# Patient Record
Sex: Male | Born: 1970 | Hispanic: No | Smoking: Former smoker
Health system: Southern US, Community
[De-identification: ages and names within clinical notes are randomized; demographics above are authoritative.]

## PROBLEM LIST (undated history)

## (undated) DIAGNOSIS — E7800 Pure hypercholesterolemia, unspecified: Secondary | ICD-10-CM

## (undated) DIAGNOSIS — I10 Essential (primary) hypertension: Secondary | ICD-10-CM

---

## 2014-01-05 ENCOUNTER — Encounter (HOSPITAL_COMMUNITY): Payer: BC Managed Care – PPO | Admitting: Anesthesiology

## 2014-01-05 ENCOUNTER — Encounter (HOSPITAL_COMMUNITY): Admission: EM | Disposition: A | Discharge status: Home / Self Care | Payer: Self-pay | Source: Home / Self Care

## 2014-01-05 ENCOUNTER — Inpatient Hospital Stay (HOSPITAL_COMMUNITY)
Admission: EM | Admit: 2014-01-05 | Discharge: 2014-01-25 | DRG: 853 | Disposition: A | Discharge status: Home / Self Care | Payer: BC Managed Care – PPO | Attending: General Surgery | Admitting: General Surgery

## 2014-01-05 ENCOUNTER — Encounter (HOSPITAL_COMMUNITY): Payer: Self-pay | Admitting: Emergency Medicine

## 2014-01-05 ENCOUNTER — Emergency Department (HOSPITAL_COMMUNITY): Payer: BC Managed Care – PPO

## 2014-01-05 ENCOUNTER — Emergency Department (HOSPITAL_COMMUNITY): Payer: BC Managed Care – PPO | Admitting: Anesthesiology

## 2014-01-05 DIAGNOSIS — J80 Acute respiratory distress syndrome: Secondary | ICD-10-CM

## 2014-01-05 DIAGNOSIS — E7800 Pure hypercholesterolemia, unspecified: Secondary | ICD-10-CM | POA: Diagnosis present

## 2014-01-05 DIAGNOSIS — D649 Anemia, unspecified: Secondary | ICD-10-CM | POA: Diagnosis present

## 2014-01-05 DIAGNOSIS — I10 Essential (primary) hypertension: Secondary | ICD-10-CM | POA: Diagnosis present

## 2014-01-05 DIAGNOSIS — R6520 Severe sepsis without septic shock: Secondary | ICD-10-CM | POA: Diagnosis present

## 2014-01-05 DIAGNOSIS — K560 Paralytic ileus: Secondary | ICD-10-CM | POA: Diagnosis not present

## 2014-01-05 DIAGNOSIS — G8918 Other acute postprocedural pain: Secondary | ICD-10-CM | POA: Diagnosis not present

## 2014-01-05 DIAGNOSIS — Z6827 Body mass index (BMI) 27.0-27.9, adult: Secondary | ICD-10-CM | POA: Diagnosis not present

## 2014-01-05 DIAGNOSIS — A419 Sepsis, unspecified organism: Principal | ICD-10-CM

## 2014-01-05 DIAGNOSIS — J9600 Acute respiratory failure, unspecified whether with hypoxia or hypercapnia: Secondary | ICD-10-CM

## 2014-01-05 DIAGNOSIS — R1031 Right lower quadrant pain: Secondary | ICD-10-CM | POA: Diagnosis present

## 2014-01-05 DIAGNOSIS — F411 Generalized anxiety disorder: Secondary | ICD-10-CM | POA: Diagnosis not present

## 2014-01-05 DIAGNOSIS — R7309 Other abnormal glucose: Secondary | ICD-10-CM | POA: Diagnosis not present

## 2014-01-05 DIAGNOSIS — J9601 Acute respiratory failure with hypoxia: Secondary | ICD-10-CM

## 2014-01-05 DIAGNOSIS — R0600 Dyspnea, unspecified: Secondary | ICD-10-CM

## 2014-01-05 DIAGNOSIS — Z87891 Personal history of nicotine dependence: Secondary | ICD-10-CM | POA: Diagnosis not present

## 2014-01-05 DIAGNOSIS — N179 Acute kidney failure, unspecified: Secondary | ICD-10-CM | POA: Diagnosis not present

## 2014-01-05 DIAGNOSIS — E876 Hypokalemia: Secondary | ICD-10-CM | POA: Diagnosis not present

## 2014-01-05 DIAGNOSIS — J189 Pneumonia, unspecified organism: Secondary | ICD-10-CM | POA: Diagnosis not present

## 2014-01-05 DIAGNOSIS — K352 Acute appendicitis with generalized peritonitis, without abscess: Secondary | ICD-10-CM | POA: Diagnosis present

## 2014-01-05 DIAGNOSIS — R188 Other ascites: Secondary | ICD-10-CM

## 2014-01-05 DIAGNOSIS — E43 Unspecified severe protein-calorie malnutrition: Secondary | ICD-10-CM | POA: Diagnosis present

## 2014-01-05 DIAGNOSIS — I498 Other specified cardiac arrhythmias: Secondary | ICD-10-CM | POA: Diagnosis not present

## 2014-01-05 DIAGNOSIS — E46 Unspecified protein-calorie malnutrition: Secondary | ICD-10-CM

## 2014-01-05 DIAGNOSIS — K3532 Acute appendicitis with perforation and localized peritonitis, without abscess: Secondary | ICD-10-CM | POA: Diagnosis present

## 2014-01-05 DIAGNOSIS — K35209 Acute appendicitis with generalized peritonitis, without abscess, unspecified as to perforation: Secondary | ICD-10-CM | POA: Diagnosis present

## 2014-01-05 HISTORY — PX: APPENDECTOMY: SHX54

## 2014-01-05 HISTORY — DX: Pure hypercholesterolemia, unspecified: E78.00

## 2014-01-05 HISTORY — PX: LAPAROSCOPIC APPENDECTOMY: SHX408

## 2014-01-05 HISTORY — DX: Essential (primary) hypertension: I10

## 2014-01-05 LAB — CBC WITH DIFFERENTIAL/PLATELET
BASOS PCT: 0 % (ref 0–1)
Basophils Absolute: 0 10*3/uL (ref 0.0–0.1)
EOS ABS: 0 10*3/uL (ref 0.0–0.7)
EOS PCT: 0 % (ref 0–5)
HCT: 43.9 % (ref 39.0–52.0)
Hemoglobin: 15.8 g/dL (ref 13.0–17.0)
LYMPHS PCT: 11 % — AB (ref 12–46)
Lymphs Abs: 0.9 10*3/uL (ref 0.7–4.0)
MCH: 28.8 pg (ref 26.0–34.0)
MCHC: 36 g/dL (ref 30.0–36.0)
MCV: 80 fL (ref 78.0–100.0)
Monocytes Absolute: 0.6 10*3/uL (ref 0.1–1.0)
Monocytes Relative: 8 % (ref 3–12)
Neutro Abs: 6.5 10*3/uL (ref 1.7–7.7)
Neutrophils Relative %: 81 % — ABNORMAL HIGH (ref 43–77)
Platelets: 173 10*3/uL (ref 150–400)
RBC: 5.49 MIL/uL (ref 4.22–5.81)
RDW: 13.3 % (ref 11.5–15.5)
WBC: 8 10*3/uL (ref 4.0–10.5)

## 2014-01-05 LAB — COMPREHENSIVE METABOLIC PANEL
ALBUMIN: 3.1 g/dL — AB (ref 3.5–5.2)
ALK PHOS: 40 U/L (ref 39–117)
ALT: 20 U/L (ref 0–53)
AST: 19 U/L (ref 0–37)
Anion gap: 19 — ABNORMAL HIGH (ref 5–15)
BUN: 28 mg/dL — ABNORMAL HIGH (ref 6–23)
CO2: 23 mEq/L (ref 19–32)
Calcium: 8.6 mg/dL (ref 8.4–10.5)
Chloride: 90 mEq/L — ABNORMAL LOW (ref 96–112)
Creatinine, Ser: 1.24 mg/dL (ref 0.50–1.35)
GFR calc non Af Amer: 69 mL/min — ABNORMAL LOW (ref >90)
GFR, EST AFRICAN AMERICAN: 80 mL/min — AB (ref >90)
GLUCOSE: 117 mg/dL — AB (ref 70–99)
Potassium: 3.5 mEq/L — ABNORMAL LOW (ref 3.7–5.3)
SODIUM: 132 meq/L — AB (ref 137–147)
Total Bilirubin: 1.5 mg/dL — ABNORMAL HIGH (ref 0.3–1.2)
Total Protein: 8.1 g/dL (ref 6.0–8.3)

## 2014-01-05 LAB — URINE MICROSCOPIC-ADD ON

## 2014-01-05 LAB — URINALYSIS, ROUTINE W REFLEX MICROSCOPIC
GLUCOSE, UA: NEGATIVE mg/dL
Ketones, ur: 15 mg/dL — AB
Nitrite: NEGATIVE
PH: 6 (ref 5.0–8.0)
Protein, ur: 100 mg/dL — AB
Specific Gravity, Urine: 1.02 (ref 1.005–1.030)
Urobilinogen, UA: 0.2 mg/dL (ref 0.0–1.0)

## 2014-01-05 SURGERY — APPENDECTOMY, LAPAROSCOPIC
Anesthesia: General | Site: Abdomen

## 2014-01-05 MED ORDER — PROPOFOL 10 MG/ML IV BOLUS
INTRAVENOUS | Status: AC
  Filled 2014-01-05: qty 20

## 2014-01-05 MED ORDER — 0.9 % SODIUM CHLORIDE (POUR BTL) OPTIME
TOPICAL | Status: DC | PRN
  Administered 2014-01-05: 1000 mL

## 2014-01-05 MED ORDER — ACETAMINOPHEN 325 MG PO TABS
650.0000 mg | ORAL_TABLET | Freq: Four times a day (QID) | ORAL | Status: DC | PRN
  Administered 2014-01-06 – 2014-01-19 (×5): 650 mg via ORAL
  Filled 2014-01-05 (×6): qty 2

## 2014-01-05 MED ORDER — FENTANYL CITRATE 0.05 MG/ML IJ SOLN
INTRAMUSCULAR | Status: DC | PRN
Start: 2014-01-05 — End: 2014-01-05
  Administered 2014-01-05: 25 ug via INTRAVENOUS
  Administered 2014-01-05: 50 ug via INTRAVENOUS
  Administered 2014-01-05: 25 ug via INTRAVENOUS
  Administered 2014-01-05 (×2): 50 ug via INTRAVENOUS

## 2014-01-05 MED ORDER — FENTANYL CITRATE 0.05 MG/ML IJ SOLN
INTRAMUSCULAR | Status: AC
  Filled 2014-01-05: qty 5

## 2014-01-05 MED ORDER — IOHEXOL 300 MG/ML  SOLN
25.0000 mL | INTRAMUSCULAR | Status: AC
  Administered 2014-01-05: 25 mL via ORAL

## 2014-01-05 MED ORDER — OXYCODONE HCL 5 MG/5ML PO SOLN: 5.0000 mg | Freq: Once | ORAL | Status: DC | PRN

## 2014-01-05 MED ORDER — LIDOCAINE HCL (CARDIAC) 20 MG/ML IV SOLN
INTRAVENOUS | Status: DC | PRN
  Administered 2014-01-05: 70 mg via INTRAVENOUS

## 2014-01-05 MED ORDER — GLYCOPYRROLATE 0.2 MG/ML IJ SOLN
INTRAMUSCULAR | Status: AC
  Filled 2014-01-05: qty 4

## 2014-01-05 MED ORDER — ACETAMINOPHEN 10 MG/ML IV SOLN
INTRAVENOUS | Status: AC
  Administered 2014-01-05: 1000 mg
  Filled 2014-01-05: qty 100

## 2014-01-05 MED ORDER — HYDROMORPHONE HCL PF 1 MG/ML IJ SOLN
0.5000 mg | INTRAMUSCULAR | Status: DC | PRN
  Administered 2014-01-05: 2 mg via INTRAVENOUS
  Administered 2014-01-06 (×2): 1 mg via INTRAVENOUS
  Administered 2014-01-06 – 2014-01-07 (×4): 2 mg via INTRAVENOUS
  Administered 2014-01-07: 1 mg via INTRAVENOUS
  Administered 2014-01-07: 2 mg via INTRAVENOUS
  Administered 2014-01-07: 1 mg via INTRAVENOUS
  Administered 2014-01-07: 2 mg via INTRAVENOUS
  Administered 2014-01-08: 1 mg via INTRAVENOUS
  Filled 2014-01-05: qty 1
  Filled 2014-01-05 (×3): qty 2
  Filled 2014-01-05 (×2): qty 1
  Filled 2014-01-05: qty 2
  Filled 2014-01-05: qty 1
  Filled 2014-01-05: qty 2
  Filled 2014-01-05: qty 1
  Filled 2014-01-05 (×2): qty 2

## 2014-01-05 MED ORDER — ARTIFICIAL TEARS OP OINT
TOPICAL_OINTMENT | OPHTHALMIC | Status: AC
  Filled 2014-01-05: qty 3.5

## 2014-01-05 MED ORDER — KCL IN DEXTROSE-NACL 20-5-0.45 MEQ/L-%-% IV SOLN
INTRAVENOUS | Status: DC
  Administered 2014-01-05 – 2014-01-06 (×2): via INTRAVENOUS
  Administered 2014-01-07: 1000 mL via INTRAVENOUS
  Filled 2014-01-05 (×10): qty 1000

## 2014-01-05 MED ORDER — ACETAMINOPHEN 650 MG RE SUPP
650.0000 mg | RECTAL | Status: DC | PRN
  Administered 2014-01-05 – 2014-01-10 (×3): 650 mg via RECTAL
  Filled 2014-01-05 (×4): qty 1

## 2014-01-05 MED ORDER — SODIUM CHLORIDE 0.9 % IV SOLN
INTRAVENOUS | Status: DC
  Administered 2014-01-05: 15:00:00 via INTRAVENOUS

## 2014-01-05 MED ORDER — DIPHENHYDRAMINE HCL 50 MG/ML IJ SOLN: 12.5000 mg | Freq: Four times a day (QID) | INTRAMUSCULAR | Status: DC | PRN

## 2014-01-05 MED ORDER — LACTATED RINGERS IV SOLN
INTRAVENOUS | Status: DC | PRN
  Administered 2014-01-05: 15:00:00 via INTRAVENOUS

## 2014-01-05 MED ORDER — HYDROMORPHONE HCL PF 1 MG/ML IJ SOLN
0.2500 mg | INTRAMUSCULAR | Status: DC | PRN
  Administered 2014-01-05 (×4): 0.5 mg via INTRAVENOUS

## 2014-01-05 MED ORDER — NEOSTIGMINE METHYLSULFATE 10 MG/10ML IV SOLN
INTRAVENOUS | Status: DC | PRN
  Administered 2014-01-05: 5 mg via INTRAVENOUS

## 2014-01-05 MED ORDER — ACETAMINOPHEN 325 MG PO TABS: 650.0000 mg | ORAL_TABLET | Freq: Four times a day (QID) | ORAL | Status: DC | PRN

## 2014-01-05 MED ORDER — IOHEXOL 300 MG/ML  SOLN
100.0000 mL | Freq: Once | INTRAMUSCULAR | Status: AC | PRN
  Administered 2014-01-05: 100 mL via INTRAVENOUS

## 2014-01-05 MED ORDER — PROMETHAZINE HCL 25 MG/ML IJ SOLN: 6.2500 mg | INTRAMUSCULAR | Status: DC | PRN

## 2014-01-05 MED ORDER — PHENYLEPHRINE HCL 10 MG/ML IJ SOLN
INTRAMUSCULAR | Status: DC | PRN
  Administered 2014-01-05: 80 ug via INTRAVENOUS

## 2014-01-05 MED ORDER — ACETAMINOPHEN 160 MG/5ML PO SOLN: 650.0000 mg | Freq: Four times a day (QID) | ORAL | Status: DC | PRN

## 2014-01-05 MED ORDER — OXYCODONE HCL 5 MG PO TABS: 5.0000 mg | ORAL_TABLET | Freq: Once | ORAL | Status: DC | PRN

## 2014-01-05 MED ORDER — MIDAZOLAM HCL 2 MG/2ML IJ SOLN
INTRAMUSCULAR | Status: AC
  Filled 2014-01-05: qty 2

## 2014-01-05 MED ORDER — SODIUM CHLORIDE 0.9 % IV BOLUS (SEPSIS)
1000.0000 mL | Freq: Once | INTRAVENOUS | Status: AC
  Administered 2014-01-05: 1000 mL via INTRAVENOUS

## 2014-01-05 MED ORDER — ONDANSETRON HCL 4 MG/2ML IJ SOLN
INTRAMUSCULAR | Status: DC | PRN
  Administered 2014-01-05: 4 mg via INTRAVENOUS

## 2014-01-05 MED ORDER — PHENYLEPHRINE 40 MCG/ML (10ML) SYRINGE FOR IV PUSH (FOR BLOOD PRESSURE SUPPORT)
PREFILLED_SYRINGE | INTRAVENOUS | Status: AC
  Filled 2014-01-05: qty 10

## 2014-01-05 MED ORDER — ONDANSETRON HCL 4 MG/2ML IJ SOLN
4.0000 mg | INTRAMUSCULAR | Status: DC | PRN
  Administered 2014-01-24: 4 mg via INTRAVENOUS
  Filled 2014-01-05: qty 2

## 2014-01-05 MED ORDER — PIPERACILLIN-TAZOBACTAM 3.375 G IVPB 30 MIN
3.3750 g | Freq: Once | INTRAVENOUS | Status: AC
  Administered 2014-01-05: 3.375 g via INTRAVENOUS
  Filled 2014-01-05: qty 50

## 2014-01-05 MED ORDER — NEOSTIGMINE METHYLSULFATE 10 MG/10ML IV SOLN
INTRAVENOUS | Status: AC
  Filled 2014-01-05: qty 1

## 2014-01-05 MED ORDER — SUCCINYLCHOLINE CHLORIDE 20 MG/ML IJ SOLN
INTRAMUSCULAR | Status: DC | PRN
  Administered 2014-01-05: 100 mg via INTRAVENOUS

## 2014-01-05 MED ORDER — ACETAMINOPHEN 650 MG RE SUPP
650.0000 mg | Freq: Four times a day (QID) | RECTAL | Status: DC | PRN
  Filled 2014-01-05: qty 1

## 2014-01-05 MED ORDER — ONDANSETRON HCL 4 MG/2ML IJ SOLN
INTRAMUSCULAR | Status: AC
  Filled 2014-01-05: qty 2

## 2014-01-05 MED ORDER — MIDAZOLAM HCL 5 MG/5ML IJ SOLN
INTRAMUSCULAR | Status: DC | PRN
  Administered 2014-01-05 (×2): 1 mg via INTRAVENOUS

## 2014-01-05 MED ORDER — SUCCINYLCHOLINE CHLORIDE 20 MG/ML IJ SOLN
INTRAMUSCULAR | Status: AC
  Filled 2014-01-05: qty 1

## 2014-01-05 MED ORDER — LABETALOL HCL 5 MG/ML IV SOLN
INTRAVENOUS | Status: AC
  Administered 2014-01-05: 10 mg
  Filled 2014-01-05: qty 4

## 2014-01-05 MED ORDER — ENOXAPARIN SODIUM 40 MG/0.4ML ~~LOC~~ SOLN
40.0000 mg | SUBCUTANEOUS | Status: DC
  Administered 2014-01-06 – 2014-01-08 (×3): 40 mg via SUBCUTANEOUS
  Filled 2014-01-05 (×3): qty 0.4

## 2014-01-05 MED ORDER — HYDROMORPHONE HCL PF 1 MG/ML IJ SOLN
INTRAMUSCULAR | Status: AC
  Filled 2014-01-05: qty 2

## 2014-01-05 MED ORDER — ACETAMINOPHEN 325 MG PO TABS
325.0000 mg | ORAL_TABLET | Freq: Once | ORAL | Status: AC
  Administered 2014-01-05: 325 mg via ORAL
  Filled 2014-01-05: qty 1

## 2014-01-05 MED ORDER — ARTIFICIAL TEARS OP OINT
TOPICAL_OINTMENT | OPHTHALMIC | Status: DC | PRN
  Administered 2014-01-05: 1 via OPHTHALMIC

## 2014-01-05 MED ORDER — ONDANSETRON HCL 4 MG/2ML IJ SOLN
4.0000 mg | Freq: Once | INTRAMUSCULAR | Status: AC
  Administered 2014-01-05: 4 mg via INTRAVENOUS
  Filled 2014-01-05: qty 2

## 2014-01-05 MED ORDER — HYDROMORPHONE HCL PF 1 MG/ML IJ SOLN
0.5000 mg | Freq: Once | INTRAMUSCULAR | Status: AC
  Administered 2014-01-05: 0.5 mg via INTRAVENOUS
  Filled 2014-01-05: qty 1

## 2014-01-05 MED ORDER — BUPIVACAINE-EPINEPHRINE (PF) 0.25% -1:200000 IJ SOLN
INTRAMUSCULAR | Status: AC
  Filled 2014-01-05: qty 30

## 2014-01-05 MED ORDER — BUPIVACAINE-EPINEPHRINE 0.25% -1:200000 IJ SOLN
INTRAMUSCULAR | Status: DC | PRN
  Administered 2014-01-05: 10 mL

## 2014-01-05 MED ORDER — DIPHENHYDRAMINE HCL 12.5 MG/5ML PO ELIX
12.5000 mg | ORAL_SOLUTION | Freq: Four times a day (QID) | ORAL | Status: DC | PRN
  Filled 2014-01-05: qty 10

## 2014-01-05 MED ORDER — PANTOPRAZOLE SODIUM 40 MG IV SOLR
40.0000 mg | Freq: Every day | INTRAVENOUS | Status: DC
  Administered 2014-01-05 – 2014-01-10 (×6): 40 mg via INTRAVENOUS
  Filled 2014-01-05 (×8): qty 40

## 2014-01-05 MED ORDER — GLYCOPYRROLATE 0.2 MG/ML IJ SOLN
INTRAMUSCULAR | Status: DC | PRN
  Administered 2014-01-05: .8 mg via INTRAVENOUS

## 2014-01-05 MED ORDER — ROCURONIUM BROMIDE 100 MG/10ML IV SOLN
INTRAVENOUS | Status: DC | PRN
  Administered 2014-01-05: 40 mg via INTRAVENOUS

## 2014-01-05 MED ORDER — PROPOFOL 10 MG/ML IV BOLUS
INTRAVENOUS | Status: DC | PRN
  Administered 2014-01-05: 200 mg via INTRAVENOUS

## 2014-01-05 MED ORDER — SODIUM CHLORIDE 0.9 % IR SOLN
Status: DC | PRN
  Administered 2014-01-05: 1000 mL

## 2014-01-05 MED ORDER — PIPERACILLIN-TAZOBACTAM 3.375 G IVPB
3.3750 g | Freq: Three times a day (TID) | INTRAVENOUS | Status: DC
  Administered 2014-01-05 – 2014-01-11 (×17): 3.375 g via INTRAVENOUS
  Filled 2014-01-05 (×20): qty 50

## 2014-01-05 MED ORDER — ROCURONIUM BROMIDE 50 MG/5ML IV SOLN
INTRAVENOUS | Status: AC
  Filled 2014-01-05: qty 1

## 2014-01-05 MED ORDER — ACETAMINOPHEN 160 MG/5ML PO SOLN
650.0000 mg | Freq: Four times a day (QID) | ORAL | Status: DC | PRN
  Administered 2014-01-09 – 2014-01-16 (×11): 650 mg via ORAL
  Filled 2014-01-05 (×11): qty 20.3

## 2014-01-05 SURGICAL SUPPLY — 54 items
APPLIER CLIP ROT 10 11.4 M/L (STAPLE)
BLADE SURG ROTATE 9660 (MISCELLANEOUS) IMPLANT
CANISTER SUCTION 2500CC (MISCELLANEOUS) ×3 IMPLANT
CHLORAPREP W/TINT 26ML (MISCELLANEOUS) ×3 IMPLANT
CLIP APPLIE ROT 10 11.4 M/L (STAPLE) IMPLANT
COVER SURGICAL LIGHT HANDLE (MISCELLANEOUS) ×3 IMPLANT
CUTTER FLEX LINEAR 45M (STAPLE) ×3 IMPLANT
CUTTER LINEAR ENDO 35 ETS (STAPLE) IMPLANT
CUTTER LINEAR ENDO 35 ETS TH (STAPLE) IMPLANT
DERMABOND ADVANCED (GAUZE/BANDAGES/DRESSINGS) ×2
DERMABOND ADVANCED .7 DNX12 (GAUZE/BANDAGES/DRESSINGS) ×1 IMPLANT
DRAIN CHANNEL 19F RND (DRAIN) ×3 IMPLANT
DRAPE UTILITY 15X26 W/TAPE STR (DRAPE) ×6 IMPLANT
ELECT REM PT RETURN 9FT ADLT (ELECTROSURGICAL) ×3
ELECTRODE REM PT RTRN 9FT ADLT (ELECTROSURGICAL) ×1 IMPLANT
EVACUATOR SILICONE 100CC (DRAIN) ×3 IMPLANT
GAUZE SPONGE 2X2 8PLY STRL LF (GAUZE/BANDAGES/DRESSINGS) ×1 IMPLANT
GLOVE BIO SURGEON STRL SZ7.5 (GLOVE) ×3 IMPLANT
GLOVE BIO SURGEON STRL SZ8 (GLOVE) ×3 IMPLANT
GLOVE BIOGEL PI IND STRL 6.5 (GLOVE) ×1 IMPLANT
GLOVE BIOGEL PI IND STRL 7.0 (GLOVE) ×1 IMPLANT
GLOVE BIOGEL PI IND STRL 7.5 (GLOVE) ×1 IMPLANT
GLOVE BIOGEL PI IND STRL 8 (GLOVE) ×1 IMPLANT
GLOVE BIOGEL PI INDICATOR 6.5 (GLOVE) ×2
GLOVE BIOGEL PI INDICATOR 7.0 (GLOVE) ×2
GLOVE BIOGEL PI INDICATOR 7.5 (GLOVE) ×2
GLOVE BIOGEL PI INDICATOR 8 (GLOVE) ×2
GOWN STRL REUS W/ TWL LRG LVL3 (GOWN DISPOSABLE) ×2 IMPLANT
GOWN STRL REUS W/ TWL XL LVL3 (GOWN DISPOSABLE) ×1 IMPLANT
GOWN STRL REUS W/TWL LRG LVL3 (GOWN DISPOSABLE) ×4
GOWN STRL REUS W/TWL XL LVL3 (GOWN DISPOSABLE) ×2
KIT BASIN OR (CUSTOM PROCEDURE TRAY) ×3 IMPLANT
KIT ROOM TURNOVER OR (KITS) ×3 IMPLANT
NEEDLE 22X1 1/2 (OR ONLY) (NEEDLE) ×3 IMPLANT
NS IRRIG 1000ML POUR BTL (IV SOLUTION) ×3 IMPLANT
PAD ARMBOARD 7.5X6 YLW CONV (MISCELLANEOUS) ×6 IMPLANT
POUCH SPECIMEN RETRIEVAL 10MM (ENDOMECHANICALS) ×3 IMPLANT
RELOAD /EVU35 (ENDOMECHANICALS) IMPLANT
RELOAD CUTTER ETS 35MM STAND (ENDOMECHANICALS) IMPLANT
RELOAD STAPLE TA45 3.5 REG BLU (ENDOMECHANICALS) ×6 IMPLANT
SCALPEL HARMONIC ACE (MISCELLANEOUS) ×3 IMPLANT
SET IRRIG TUBING LAPAROSCOPIC (IRRIGATION / IRRIGATOR) ×3 IMPLANT
SPECIMEN JAR SMALL (MISCELLANEOUS) ×3 IMPLANT
SPONGE GAUZE 2X2 STER 10/PKG (GAUZE/BANDAGES/DRESSINGS) ×2
SUT ETHILON 2 0 FS 18 (SUTURE) ×3 IMPLANT
SUT VIC AB 4-0 PS2 27 (SUTURE) ×3 IMPLANT
TAPE CLOTH SURG 4X10 WHT LF (GAUZE/BANDAGES/DRESSINGS) ×3 IMPLANT
TOWEL OR 17X24 6PK STRL BLUE (TOWEL DISPOSABLE) ×3 IMPLANT
TOWEL OR 17X26 10 PK STRL BLUE (TOWEL DISPOSABLE) ×3 IMPLANT
TRAY FOLEY CATH 16FR SILVER (SET/KITS/TRAYS/PACK) ×3 IMPLANT
TRAY LAPAROSCOPIC (CUSTOM PROCEDURE TRAY) ×3 IMPLANT
TROCAR XCEL 12X100 BLDLESS (ENDOMECHANICALS) ×3 IMPLANT
TROCAR XCEL BLUNT TIP 100MML (ENDOMECHANICALS) ×3 IMPLANT
TROCAR XCEL NON-BLD 5MMX100MML (ENDOMECHANICALS) ×3 IMPLANT

## 2014-01-05 NOTE — H&P (Signed)
Perforated appendicitis with SIRS. To OR for laparoscopic appendectomy. Procedure, risks, and benefits D/W him with the help of his uncle to translate. He agrees. Patient examined and I agree with the assessment and plan  Violeta Gelinas, MD, MPH, FACS Trauma: [phone] General Surgery: [phone]  01/05/2014 2:39 PM

## 2014-01-05 NOTE — Anesthesia Postprocedure Evaluation (Signed)
Anesthesia Post Note  Patient: Jesus Hunter  Procedure(s) Performed: Procedure(s) (LRB): APPENDECTOMY LAPAROSCOPIC (N/A)  Anesthesia type: general  Patient location: PACU  Post pain: Pain level controlled  Post assessment: Patient's Cardiovascular Status Stable  Last Vitals:  Filed Vitals:   01/05/14 1815  BP:   Pulse: 117  Temp:   Resp: 25    Post vital signs: Reviewed and stable  Level of consciousness: sedated  Complications: No apparent anesthesia complications

## 2014-01-05 NOTE — Op Note (Signed)
01/05/2014  4:42 PM  PATIENT:  Jesus Hunter  43 y.o. male  PRE-OPERATIVE DIAGNOSIS:  Ruptured Appendicitis  POST-OPERATIVE DIAGNOSIS:  Gangrenous Ruptured Appendicitis  PROCEDURE:  Procedure(s): APPENDECTOMY LAPAROSCOPIC, DRAINAGE OF INTRA-ABDOMINAL ABSCESS  SURGEON:  Surgeon(s): [name], MD  ASSISTANTS: Magnus Ivan, RNFA   ANESTHESIA:   local and general  EBL:  Total I/O In: 1000 [I.V.:1000] Out: -   BLOOD ADMINISTERED:none  DRAINS: (1) Jackson-Pratt drain(s) with closed bulb suction in the RLQ   SPECIMEN:  Excision  DISPOSITION OF SPECIMEN:  PATHOLOGY  COUNTS:  YES  DICTATION: Reubin Milan Dictation  Patient presented to the emergency department with fever and 5 days abdominal pain. CT revealed perforated appendicitis with loculated fluid collections in the right lower quadrant. He received intravenous antibiotics. He is brought emergently to the operating room for laparoscopic appendectomy. Informed consent was obtained. He was identified in the preop holding area. He was brought to the operating room and general endotracheal anesthesia was a Optician, dispensing by the anesthesia staff. Foley catheter was placed by nursing. His abdomen was prepped and draped in sterile fashion. Time out procedure was done. Infraumbilical region was infiltrated with local. Infraumbilical incision was made. Subcutaneous tissues were dissected down revealing the anterior fascia. This was divided sharply along the midline. Perineal cavity was entered under direct vision. 0 Vicryl pursestring suture was placed on the fascial opening. Hassan trocar was inserted. Abdomen was insufflated with carbon dioxide in standard fashion. 5 mm right mid abdominal port was placed. This facilitated some blunt dissection in the right lower quadrant. Loops of bowel with fibrinous exudate and pus were stuck in the right lower quadrant. These were bluntly freed up. This allowed placement of a left lower quadrant 12 mm port. Local  was used at these port sites. Further dissection revealed a gangrenous perforated appendix. It was almost completely black. The mesoappendix was divided with harmonic scalpel achieving good hemostasis. The base was then divided with 2 firings of an Endo GIA stapler. Appendix was placed in Endo Catch bag and removed from the left lower quadrant port site. Next the area was copiously irrigated. Some gross stool also were present were also removed. The staple line was inspected and appeared intact but it was reinforced with a couple of clips. The abdomen was then irrigated with 3 L of saline. Irrigation fluid eventually returned clear. 19 [place] Blake drain was placed in the right lower quadrant where the appendix was previously located. Rupture the right port site and sutured to the skin with nylon. Remainder irrigation fluid was evacuated. Ports were removed under direct vision. Infraumbilical fascia was closed by tying the Vicryl suture. The remaining wounds were irrigated and the skin of each was closed with running 4 Vicryl subcuticular followed by Dermabond. All counts were correct. Patient tolerated procedure well without apparent complication was taken recovery in stable condition.  PATIENT DISPOSITION:  PACU - hemodynamically stable.   Delay start of Pharmacological VTE agent (>24hrs) due to surgical blood loss or risk of bleeding:  no  Violeta Gelinas, MD, MPH, FACS Pager: [phone]  8/27/20154:42 PM

## 2014-01-05 NOTE — ED Notes (Signed)
Pt reports RLQ abdominal pain x 3 days. Denies n/v. Pt reporting diarrhea x 3 days. Pt a/o at this time. HR 123.

## 2014-01-05 NOTE — ED Provider Notes (Signed)
CSN: [id number]     Arrival date & time 01/05/14  1157 History   First MD Initiated Contact with Patient 01/05/14 1209     Chief Complaint  Patient presents with  . Abdominal Pain     (Consider location/radiation/quality/duration/timing/severity/associated sxs/prior Treatment) Patient is a 43 y.o. male presenting with abdominal pain. The history is provided by the patient. No language interpreter was used.  Abdominal Pain Pain location:  RLQ Associated symptoms: fever   Associated symptoms: no chills, no diarrhea, no dysuria, no nausea and no vomiting   Associated symptoms comment:  Abdominal pain for the past 2 days located in RLQ. He has had fever for the past 4 days. No diarrhea, N, V. He reports significant bloating/distension of abdomen. His last normal bowel movement was this morning, was non-bloody and did cause increased to his pain.   Past Medical History  Diagnosis Date  . Hypertension   . High cholesterol    History reviewed. No pertinent past surgical history. History reviewed. No pertinent family history. History  Substance Use Topics  . Smoking status: Former Games developer  . Smokeless tobacco: Not on file  . Alcohol Use: Yes     Comment: occasional    Review of Systems  Constitutional: Positive for fever. Negative for chills.  Respiratory: Negative.   Cardiovascular: Negative.   Gastrointestinal: Positive for abdominal pain and abdominal distention. Negative for nausea, vomiting, diarrhea and blood in stool.  Genitourinary: Negative.  Negative for dysuria.  Musculoskeletal: Positive for back pain.  Skin: Negative.  Negative for wound.  Neurological: Negative.  Negative for weakness.      Allergies  Review of patient's allergies indicates no known allergies.  Home Medications   Prior to Admission medications   Not on File   BP 142/92  Pulse 121  Temp(Src) 102.5 F (39.2 C) (Oral)  Resp 30  SpO2 97% Physical Exam  Constitutional: He is oriented to  person, place, and time. He appears well-developed and well-nourished.  Uncomfortable appearing.  HENT:  Head: Normocephalic.  Neck: Normal range of motion. Neck supple.  Cardiovascular: Normal rate and regular rhythm.   Pulmonary/Chest: Effort normal and breath sounds normal.  Abdominal: Soft. Bowel sounds are normal. He exhibits distension. He exhibits no mass. There is tenderness. There is rebound and guarding.  RLQ tenderness with peritoneal signs of guarding and rebound tenderness. Abdomen is moderately distended and tense. BS are absent.   Musculoskeletal: Normal range of motion. He exhibits no edema.  Neurological: He is alert and oriented to person, place, and time.  Skin: Skin is warm and dry. No rash noted.  Psychiatric: He has a normal mood and affect.    ED Course  Procedures (including critical care time) Labs Review Labs Reviewed  CBC WITH DIFFERENTIAL  COMPREHENSIVE METABOLIC PANEL  URINALYSIS, ROUTINE W REFLEX MICROSCOPIC   Results for orders placed during the hospital encounter of 01/05/14  COMPREHENSIVE METABOLIC PANEL      Result Value Ref Range   Sodium 132 (*) 137 - 147 mEq/L   Potassium 3.5 (*) 3.7 - 5.3 mEq/L   Chloride 90 (*) 96 - 112 mEq/L   CO2 23  19 - 32 mEq/L   Glucose, Bld 117 (*) 70 - 99 mg/dL   BUN 28 (*) 6 - 23 mg/dL   Creatinine, Ser 4.09  0.50 - 1.35 mg/dL   Calcium 8.6  8.4 - 81.1 mg/dL   Total Protein 8.1  6.0 - 8.3 g/dL   Albumin 3.1 (*) 3.5 -  5.2 g/dL   AST 19  0 - 37 U/L   ALT 20  0 - 53 U/L   Alkaline Phosphatase 40  39 - 117 U/L   Total Bilirubin 1.5 (*) 0.3 - 1.2 mg/dL   GFR calc non Af Amer 69 (*) >90 mL/min   GFR calc Af Amer 80 (*) >90 mL/min   Anion gap 19 (*) 5 - 15    Ct Abdomen Pelvis W Contrast  01/05/2014   CLINICAL DATA:  Right lower quadrant abdominal pain and fever.  EXAM: CT ABDOMEN AND PELVIS WITH CONTRAST  TECHNIQUE: Multidetector CT imaging of the abdomen and pelvis was performed using the standard protocol  following bolus administration of intravenous contrast.  CONTRAST:  OMNIPAQUE IOHEXOL 300 MG/ML  SOLN  COMPARISON:  None.  FINDINGS: Moderate bibasilar airspace disease is present. There is some ground-glass attenuation in the right middle lobe as well. The heart size is normal. There is no significant pleural or pericardial effusion.  Liver and spleen are within normal limits. Small hiatal hernia is present. The stomach, duodenum, and pancreas are otherwise within normal limits. Common bile duct and gallbladder are normal. The adrenal glands are normal bilaterally. Kidneys and ureters are unremarkable.  An 8 mm appendicolith is present. The appendix is markedly enlarged inflamed. There is evidence for rupture with free fluid in free air layering in the right lower quadrant. There is marked secondary inflammatory change of the distal small bowel loops. Additional or fluid may be an capsulated extending into the pelvis. The colon is fluid-filled.  The urinary bladder is within normal limits.  The bone windows demonstrate no focal lytic or blastic lesions.  IMPRESSION: 1. Ruptured appendicitis with scattered air-fluid levels in the right lower quadrant. 2. Potentially encapsulated fluid extending from the lower right lower quadrant into the pelvis. 3. Marked secondary inflammatory changes of the distal small bowel. 4. Bibasilar airspace disease likely reflects atelectasis.   Electronically Signed   By: Gennette Pac M.D.   On: 01/05/2014 13:45   Imaging Review No results found.   EKG Interpretation None      MDM   Final diagnoses:  None    1. Ruptured appendix  The patient remains stable, awake, alert. Persistent tachycardia on the monitor, normal blood pressure, febrile. Abdomen concerning for ruptured appendix. Discussed with radiology - will do IV CM only CT in the interest of time.   1:50 - Radiology advised ruptured appendix. Discussed with Violeta Gelinas (gen. Surgery) who will see in  the ED. Patient and family advised - all questions answered.   Arnoldo Hooker, PA-C 01/05/14 1401

## 2014-01-05 NOTE — Anesthesia Procedure Notes (Signed)
Procedure Name: Intubation Date/Time: 01/05/2014 3:48 PM Performed by: Darcey Nora B Pre-anesthesia Checklist: Patient identified Patient Re-evaluated:Patient Re-evaluated prior to inductionOxygen Delivery Method: Circle system utilized Preoxygenation: Pre-oxygenation with 100% oxygen Intubation Type: IV induction Ventilation: Mask ventilation without difficulty Laryngoscope Size: Mac and 4 Grade View: Grade II Tube type: Oral Tube size: 8.0 mm Number of attempts: 1 Airway Equipment and Method: Stylet Placement Confirmation: ETT inserted through vocal cords under direct vision,  breath sounds checked- equal and bilateral and positive ETCO2 Secured at: 21 (cm at teeth) cm Tube secured with: Tape Dental Injury: Teeth and Oropharynx as per pre-operative assessment

## 2014-01-05 NOTE — ED Provider Notes (Signed)
Medical screening examination/treatment/procedure(s) were conducted as a shared visit with non-physician practitioner(s) and myself.  I personally evaluated the patient during the encounter.   EKG Interpretation None      Jesus Hunter is a 43 y.o. male here with RLQ pain for the last 2 days. Also fever and ab distention. On exam, febrile, tachy. Abdomen distended, + RLQ tenderness with rebound. CT showed ruptured appendix. Given zosyn. Surgery will admit.    Richardean Canal, MD 01/05/14 [phone]

## 2014-01-05 NOTE — Transfer of Care (Signed)
Immediate Anesthesia Transfer of Care Note  Patient: Jesus Hunter  Procedure(s) Performed: Procedure(s): APPENDECTOMY LAPAROSCOPIC (N/A)  Patient Location: PACU  Anesthesia Type:General  Level of Consciousness: awake, sedated and patient cooperative  Airway & Oxygen Therapy: Patient Spontanous Breathing and Patient connected to nasal cannula oxygen  Post-op Assessment: Report given to PACU RN, Post -op Vital signs reviewed and stable and Patient moving all extremities  Post vital signs: Reviewed and stable  Complications: No apparent anesthesia complications

## 2014-01-05 NOTE — H&P (Signed)
Chief Complaint: RLQ abdominal pain  HPI: Jesus Hunter is a 43 year old [place] male who presents with 4 days of RLQ abdominal pain.  Onset was sudden.  Coarse is worsening.  Time pattern is constant.  Severe in severity.  Modifying factors include; tylenol.  Associated with fever of 102, sweats, nausea, anorexia, constipation, but developed diarrhea this am, low urine output.  No aggravating or alleviating factors.  He has a normal white count, tachycardic in the 120s.  Ct of abdomen and pelvis with ruptured appendicitis and possible abscess.  We have therefore been asked to evaluate the patient for surgery.  He has been NPO since yesterday.    Past Medical History  Diagnosis Date  . Hypertension   . High cholesterol     History reviewed. No pertinent past surgical history.  History reviewed. No pertinent family history. Social History:  reports that he has quit smoking. He does not have any smokeless tobacco history on file. He reports that he drinks alcohol. He reports that he does not use illicit drugs.  Allergies: No Known Allergies  Medication: none  (Not in a hospital admission)  Results for orders placed during the hospital encounter of 01/05/14 (from the past 48 hour(s))  CBC WITH DIFFERENTIAL     Status: Abnormal   Collection Time    01/05/14 12:28 PM      Result Value Ref Range   WBC 8.0  4.0 - 10.5 K/uL   RBC 5.49  4.22 - 5.81 MIL/uL   Hemoglobin 15.8  13.0 - 17.0 g/dL   HCT 43.9  39.0 - 52.0 %   MCV 80.0  78.0 - 100.0 fL   MCH 28.8  26.0 - 34.0 pg   MCHC 36.0  30.0 - 36.0 g/dL   RDW 13.3  11.5 - 15.5 %   Platelets 173  150 - 400 K/uL   Comment: PLATELET COUNT CONFIRMED BY SMEAR   Neutrophils Relative % 81 (*) 43 - 77 %   Lymphocytes Relative 11 (*) 12 - 46 %   Monocytes Relative 8  3 - 12 %   Eosinophils Relative 0  0 - 5 %   Basophils Relative 0  0 - 1 %   Neutro Abs 6.5  1.7 - 7.7 K/uL   Lymphs Abs 0.9  0.7 - 4.0 K/uL   Monocytes Absolute 0.6  0.1 - 1.0  K/uL   Eosinophils Absolute 0.0  0.0 - 0.7 K/uL   Basophils Absolute 0.0  0.0 - 0.1 K/uL   WBC Morphology DOHLE BODIES     Comment: INCREASED BANDS (>20% BANDS)     MILD LEFT SHIFT (1-5% METAS, OCC MYELO, OCC BANDS)  COMPREHENSIVE METABOLIC PANEL     Status: Abnormal   Collection Time    01/05/14 12:28 PM      Result Value Ref Range   Sodium 132 (*) 137 - 147 mEq/L   Potassium 3.5 (*) 3.7 - 5.3 mEq/L   Chloride 90 (*) 96 - 112 mEq/L   CO2 23  19 - 32 mEq/L   Glucose, Bld 117 (*) 70 - 99 mg/dL   BUN 28 (*) 6 - 23 mg/dL   Creatinine, Ser 1.24  0.50 - 1.35 mg/dL   Calcium 8.6  8.4 - 10.5 mg/dL   Total Protein 8.1  6.0 - 8.3 g/dL   Albumin 3.1 (*) 3.5 - 5.2 g/dL   AST 19  0 - 37 U/L   ALT 20  0 - 53 U/L  Alkaline Phosphatase 40  39 - 117 U/L   Total Bilirubin 1.5 (*) 0.3 - 1.2 mg/dL   GFR calc non Af Amer 69 (*) >90 mL/min   GFR calc Af Amer 80 (*) >90 mL/min   Comment: (NOTE)     The eGFR has been calculated using the CKD EPI equation.     This calculation has not been validated in all clinical situations.     eGFR's persistently <90 mL/min signify possible Chronic Kidney     Disease.   Anion gap 19 (*) 5 - 15   Ct Abdomen Pelvis W Contrast  01/05/2014   CLINICAL DATA:  Right lower quadrant abdominal pain and fever.  EXAM: CT ABDOMEN AND PELVIS WITH CONTRAST  TECHNIQUE: Multidetector CT imaging of the abdomen and pelvis was performed using the standard protocol following bolus administration of intravenous contrast.  CONTRAST:  139m OMNIPAQUE IOHEXOL 300 MG/ML  SOLN  COMPARISON:  None.  FINDINGS: Moderate bibasilar airspace disease is present. There is some ground-glass attenuation in the right middle lobe as well. The heart size is normal. There is no significant pleural or pericardial effusion.  Liver and spleen are within normal limits. Small hiatal hernia is present. The stomach, duodenum, and pancreas are otherwise within normal limits. Common bile duct and gallbladder are  normal. The adrenal glands are normal bilaterally. Kidneys and ureters are unremarkable.  An 8 mm appendicolith is present. The appendix is markedly enlarged inflamed. There is evidence for rupture with free fluid in free air layering in the right lower quadrant. There is marked secondary inflammatory change of the distal small bowel loops. Additional or fluid may be an capsulated extending into the pelvis. The colon is fluid-filled.  The urinary bladder is within normal limits.  The bone windows demonstrate no focal lytic or blastic lesions.  IMPRESSION: 1. Ruptured appendicitis with scattered air-fluid levels in the right lower quadrant. 2. Potentially encapsulated fluid extending from the lower right lower quadrant into the pelvis. 3. Marked secondary inflammatory changes of the distal small bowel. 4. Bibasilar airspace disease likely reflects atelectasis.   Electronically Signed   By: CLawrence SantiagoM.D.   On: 01/05/2014 13:45    Review of Systems  All other systems reviewed and are negative.   Blood pressure 149/87, pulse 126, temperature 102.5 F (39.2 C), temperature source Oral, resp. rate 30, SpO2 98.00%. Physical Exam  Constitutional: He is oriented to person, place, and time. He appears well-developed. No distress.  Neck: Normal range of motion. Neck supple.  Cardiovascular: Normal rate, regular rhythm, normal heart sounds and intact distal pulses.  Exam reveals no gallop and no friction rub.   No murmur heard. Respiratory: Effort normal and breath sounds normal. No respiratory distress. He has no wheezes. He has no rales. He exhibits no tenderness.  GI: Bowel sounds are normal. He exhibits distension. He exhibits no mass. There is tenderness. There is rebound.  TTP RLQ and LLQ, rebound tenderness.  Musculoskeletal: He exhibits no edema and no tenderness.  Lymphadenopathy:    He has no cervical adenopathy.  Neurological: He is alert and oriented to person, place, and time.  Skin: Skin  is warm. No rash noted. He is diaphoretic. No erythema. No pallor.  Psychiatric: He has a normal mood and affect. His behavior is normal. Judgment and thought content normal.     Assessment/Plan Ruptured appendicitis with possible abscess Proceed with laparoscopic appendectomy.  Risks of the surgery including but not limited to infection, bleeding, injury  to surrounding structures, ileocecectomy.  He verbalizes understanding and wishes to proceed.  We were unable to find a specific interpreter for his dialect.  His uncle is at bedside and translated for [place].   -IV hydration -NPO -Zosyn -pain control/anti-emetics -SCD, post op lovenox  Neve Branscomb ANP-BC 01/05/2014, 2:17 PM

## 2014-01-05 NOTE — Anesthesia Preprocedure Evaluation (Addendum)
Anesthesia Evaluation  Patient identified by MRN, date of birth, ID band Patient awake    Reviewed: Allergy & Precautions, H&P , NPO status , Patient's Chart, lab work & pertinent test results  Airway Mallampati: III TM Distance: >3 FB Neck ROM: Full    Dental  (+) Teeth Intact, Dental Advisory Given   Pulmonary former smoker,  breath sounds clear to auscultation        Cardiovascular Rhythm:Regular Rate:Normal + Systolic murmurs    Neuro/Psych negative neurological ROS  negative psych ROS   GI/Hepatic negative GI ROS, Neg liver ROS,   Endo/Other  negative endocrine ROS  Renal/GU Renal InsufficiencyRenal disease  negative genitourinary   Musculoskeletal negative musculoskeletal ROS (+)   Abdominal   Peds  Hematology negative hematology ROS (+)   Anesthesia Other Findings   Reproductive/Obstetrics negative OB ROS                         Anesthesia Physical Anesthesia Plan  ASA: II and emergent  Anesthesia Plan: General   Post-op Pain Management:    Induction: Intravenous and Rapid sequence  Airway Management Planned: Oral ETT  Additional Equipment: None  Intra-op Plan:   Post-operative Plan: Extubation in OR  Informed Consent: I have reviewed the patients History and Physical, chart, labs and discussed the procedure including the risks, benefits and alternatives for the proposed anesthesia with the patient or authorized representative who has indicated his/her understanding and acceptance.   Dental advisory given  Plan Discussed with: CRNA and Surgeon  Anesthesia Plan Comments:        Anesthesia Quick Evaluation

## 2014-01-06 ENCOUNTER — Encounter (HOSPITAL_COMMUNITY): Payer: Self-pay | Admitting: General Surgery

## 2014-01-06 LAB — BASIC METABOLIC PANEL
Anion gap: 14 (ref 5–15)
BUN: 24 mg/dL — AB (ref 6–23)
CHLORIDE: 95 meq/L — AB (ref 96–112)
CO2: 23 meq/L (ref 19–32)
CREATININE: 1.57 mg/dL — AB (ref 0.50–1.35)
Calcium: 7.6 mg/dL — ABNORMAL LOW (ref 8.4–10.5)
GFR calc Af Amer: 60 mL/min — ABNORMAL LOW (ref >90)
GFR calc non Af Amer: 52 mL/min — ABNORMAL LOW (ref >90)
GLUCOSE: 118 mg/dL — AB (ref 70–99)
POTASSIUM: 4.7 meq/L (ref 3.7–5.3)
Sodium: 132 mEq/L — ABNORMAL LOW (ref 137–147)

## 2014-01-06 LAB — CBC
HCT: 42.9 % (ref 39.0–52.0)
HEMOGLOBIN: 14.5 g/dL (ref 13.0–17.0)
MCH: 27.7 pg (ref 26.0–34.0)
MCHC: 33.8 g/dL (ref 30.0–36.0)
MCV: 81.9 fL (ref 78.0–100.0)
Platelets: 123 10*3/uL — ABNORMAL LOW (ref 150–400)
RBC: 5.24 MIL/uL (ref 4.22–5.81)
RDW: 13.6 % (ref 11.5–15.5)
WBC: 7.1 10*3/uL (ref 4.0–10.5)

## 2014-01-06 NOTE — Progress Notes (Signed)
Patient ID: Jesus Hunter, male   DOB: Sep 27, 1970, 43 y.o.   MRN: [id number]     [address] [place], [place] [address]    Phone: [phone] FAX: [phone]     Subjective: Unfortunately, we cannot find a translator.  Will check back when his uncle arrives.  Objective:  Vital signs:  Filed Vitals:   01/05/14 2308 01/06/14 0207 01/06/14 0240 01/06/14 0637  BP: 123/80 144/92  135/96  Pulse: 134 143  119  Temp: 101.7 F (38.7 C) 101.8 F (38.8 C) 98.9 F (37.2 C) 99.2 F (37.3 C)  TempSrc: Oral Oral  Oral  Resp: '20 21  22  ' SpO2: 96% 92%  94%       Intake/Output   Yesterday:  08/27 0701 - 08/28 0700 In: 1850 [I.V.:1850] Out: 891 [Urine:825; Drains:56; Blood:10] This shift:    I/O last 3 completed shifts: In: 8502 [I.V.:1850] Out: 891 [Urine:825; Drains:56; Blood:10]    Physical Exam: General: Pt awake/alert/oriented x4 in no acute distress Abdomen: Soft.  distended. +bs. Incisions are c/d/i.  RLQ drain with serous output.  No evidence of peritonitis.  No incarcerated hernias. Ext:  SCDs BLE.  No mjr edema.  No cyanosis Skin: No petechiae / purpura   Problem List:   Active Problems:   Acute perforated appendicitis    Results:   Labs: Results for orders placed during the hospital encounter of 01/05/14 (from the past 48 hour(s))  CBC WITH DIFFERENTIAL     Status: Abnormal   Collection Time    01/05/14 12:28 PM      Result Value Ref Range   WBC 8.0  4.0 - 10.5 K/uL   RBC 5.49  4.22 - 5.81 MIL/uL   Hemoglobin 15.8  13.0 - 17.0 g/dL   HCT 43.9  39.0 - 52.0 %   MCV 80.0  78.0 - 100.0 fL   MCH 28.8  26.0 - 34.0 pg   MCHC 36.0  30.0 - 36.0 g/dL   RDW 13.3  11.5 - 15.5 %   Platelets 173  150 - 400 K/uL   Comment: PLATELET COUNT CONFIRMED BY SMEAR   Neutrophils Relative % 81 (*) 43 - 77 %   Lymphocytes Relative 11 (*) 12 - 46 %   Monocytes Relative 8  3 - 12 %   Eosinophils Relative 0  0 -  5 %   Basophils Relative 0  0 - 1 %   Neutro Abs 6.5  1.7 - 7.7 K/uL   Lymphs Abs 0.9  0.7 - 4.0 K/uL   Monocytes Absolute 0.6  0.1 - 1.0 K/uL   Eosinophils Absolute 0.0  0.0 - 0.7 K/uL   Basophils Absolute 0.0  0.0 - 0.1 K/uL   WBC Morphology DOHLE BODIES     Comment: INCREASED BANDS (>20% BANDS)     MILD LEFT SHIFT (1-5% METAS, OCC MYELO, OCC BANDS)  COMPREHENSIVE METABOLIC PANEL     Status: Abnormal   Collection Time    01/05/14 12:28 PM      Result Value Ref Range   Sodium 132 (*) 137 - 147 mEq/L   Potassium 3.5 (*) 3.7 - 5.3 mEq/L   Chloride 90 (*) 96 - 112 mEq/L   CO2 23  19 - 32 mEq/L   Glucose, Bld 117 (*) 70 - 99 mg/dL   BUN 28 (*) 6 - 23 mg/dL   Creatinine, Ser 1.24  0.50 -  1.35 mg/dL   Calcium 8.6  8.4 - 10.5 mg/dL   Total Protein 8.1  6.0 - 8.3 g/dL   Albumin 3.1 (*) 3.5 - 5.2 g/dL   AST 19  0 - 37 U/L   ALT 20  0 - 53 U/L   Alkaline Phosphatase 40  39 - 117 U/L   Total Bilirubin 1.5 (*) 0.3 - 1.2 mg/dL   GFR calc non Af Amer 69 (*) >90 mL/min   GFR calc Af Amer 80 (*) >90 mL/min   Comment: (NOTE)     The eGFR has been calculated using the CKD EPI equation.     This calculation has not been validated in all clinical situations.     eGFR's persistently <90 mL/min signify possible Chronic Kidney     Disease.   Anion gap 19 (*) 5 - 15  URINALYSIS, ROUTINE W REFLEX MICROSCOPIC     Status: Abnormal   Collection Time    01/05/14  1:42 PM      Result Value Ref Range   Color, Urine AMBER (*) YELLOW   Comment: BIOCHEMICALS MAY BE AFFECTED BY COLOR   APPearance CLEAR  CLEAR   Specific Gravity, Urine 1.020  1.005 - 1.030   pH 6.0  5.0 - 8.0   Glucose, UA NEGATIVE  NEGATIVE mg/dL   Hgb urine dipstick LARGE (*) NEGATIVE   Bilirubin Urine SMALL (*) NEGATIVE   Ketones, ur 15 (*) NEGATIVE mg/dL   Protein, ur 100 (*) NEGATIVE mg/dL   Urobilinogen, UA 0.2  0.0 - 1.0 mg/dL   Nitrite NEGATIVE  NEGATIVE   Leukocytes, UA TRACE (*) NEGATIVE  URINE MICROSCOPIC-ADD ON      Status: Abnormal   Collection Time    01/05/14  1:42 PM      Result Value Ref Range   Squamous Epithelial / LPF RARE  RARE   WBC, UA 0-2  <3 WBC/hpf   RBC / HPF 0-2  <3 RBC/hpf   Bacteria, UA RARE  RARE   Casts GRANULAR CAST (*) NEGATIVE   Urine-Other AMORPHOUS URATES/PHOSPHATES    BODY FLUID CULTURE     Status: None   Collection Time    01/05/14  4:27 PM      Result Value Ref Range   Specimen Description PERITONEAL FLUID     Special Requests PATIENT ON FOLLOWING ZOSYN RECEIVED SWAB     Gram Stain       Value: NO WBC SEEN     ABUNDANT GRAM NEGATIVE RODS     ABUNDANT GRAM POSITIVE RODS     MODERATE GRAM POSITIVE COCCI     IN PAIRS Gram Stain Report Called to,Read Back By and Verified With: Gram Stain Report Called to,Read Back By and Verified With: Micki Riley RN 01/06/14 0740AM BY [place]   Culture       Value: Culture reincubated for better growth     Performed at [facility]   Report Status PENDING    ANAEROBIC CULTURE     Status: None   Collection Time    01/05/14  4:27 PM      Result Value Ref Range   Specimen Description PERITONEAL FLUID     Special Requests PATIENT ON FOLLOWING ZOSYN RECEIVED SWAB     Gram Stain       Value: NO WBC SEEN     ABUNDANT GRAM NEGATIVE RODS     ABUNDANT GRAM POSITIVE RODS     MODERATE GRAM POSITIVE COCCI  IN PAIRS   Culture PENDING     Report Status PENDING    BASIC METABOLIC PANEL     Status: Abnormal   Collection Time    01/06/14  5:57 AM      Result Value Ref Range   Sodium 132 (*) 137 - 147 mEq/L   Potassium 4.7  3.7 - 5.3 mEq/L   Comment: DELTA CHECK NOTED   Chloride 95 (*) 96 - 112 mEq/L   CO2 23  19 - 32 mEq/L   Glucose, Bld 118 (*) 70 - 99 mg/dL   BUN 24 (*) 6 - 23 mg/dL   Creatinine, Ser 1.57 (*) 0.50 - 1.35 mg/dL   Calcium 7.6 (*) 8.4 - 10.5 mg/dL   GFR calc non Af Amer 52 (*) >90 mL/min   GFR calc Af Amer 60 (*) >90 mL/min   Comment: (NOTE)     The eGFR has been calculated using the CKD EPI equation.     This  calculation has not been validated in all clinical situations.     eGFR's persistently <90 mL/min signify possible Chronic Kidney     Disease.   Anion gap 14  5 - 15  CBC     Status: Abnormal   Collection Time    01/06/14  5:57 AM      Result Value Ref Range   WBC 7.1  4.0 - 10.5 K/uL   RBC 5.24  4.22 - 5.81 MIL/uL   Hemoglobin 14.5  13.0 - 17.0 g/dL   HCT 42.9  39.0 - 52.0 %   MCV 81.9  78.0 - 100.0 fL   MCH 27.7  26.0 - 34.0 pg   MCHC 33.8  30.0 - 36.0 g/dL   RDW 13.6  11.5 - 15.5 %   Platelets 123 (*) 150 - 400 K/uL   Comment: DELTA CHECK NOTED     REPEATED TO VERIFY     SPECIMEN CHECKED FOR CLOTS    Imaging / Studies: Ct Abdomen Pelvis W Contrast  01/05/2014   CLINICAL DATA:  Right lower quadrant abdominal pain and fever.  EXAM: CT ABDOMEN AND PELVIS WITH CONTRAST  TECHNIQUE: Multidetector CT imaging of the abdomen and pelvis was performed using the standard protocol following bolus administration of intravenous contrast.  CONTRAST:  140m OMNIPAQUE IOHEXOL 300 MG/ML  SOLN  COMPARISON:  None.  FINDINGS: Moderate bibasilar airspace disease is present. There is some ground-glass attenuation in the right middle lobe as well. The heart size is normal. There is no significant pleural or pericardial effusion.  Liver and spleen are within normal limits. Small hiatal hernia is present. The stomach, duodenum, and pancreas are otherwise within normal limits. Common bile duct and gallbladder are normal. The adrenal glands are normal bilaterally. Kidneys and ureters are unremarkable.  An 8 mm appendicolith is present. The appendix is markedly enlarged inflamed. There is evidence for rupture with free fluid in free air layering in the right lower quadrant. There is marked secondary inflammatory change of the distal small bowel loops. Additional or fluid may be an capsulated extending into the pelvis. The colon is fluid-filled.  The urinary bladder is within normal limits.  The bone windows demonstrate  no focal lytic or blastic lesions.  IMPRESSION: 1. Ruptured appendicitis with scattered air-fluid levels in the right lower quadrant. 2. Potentially encapsulated fluid extending from the lower right lower quadrant into the pelvis. 3. Marked secondary inflammatory changes of the distal small bowel. 4. Bibasilar airspace disease likely reflects atelectasis.  Electronically Signed   By: Lawrence Santiago M.D.   On: 01/05/2014 13:45    Medications / Allergies:  Scheduled Meds: . enoxaparin (LOVENOX) injection  40 mg Subcutaneous Q24H  . pantoprazole (PROTONIX) IV  40 mg Intravenous QHS  . piperacillin-tazobactam (ZOSYN)  IV  3.375 g Intravenous 3 times per day   Continuous Infusions: . dextrose 5 % and 0.45 % NaCl with KCl 20 mEq/L 125 mL/hr at 01/05/14 1956   PRN Meds:.acetaminophen (TYLENOL) oral liquid 160 mg/5 mL, acetaminophen, acetaminophen, diphenhydrAMINE, diphenhydrAMINE, HYDROmorphone (DILAUDID) injection, ondansetron (ZOFRAN) IV  Antibiotics: Anti-infectives   Start     Dose/Rate Route Frequency Ordered Stop   01/05/14 2200  piperacillin-tazobactam (ZOSYN) IVPB 3.375 g     3.375 g 12.5 mL/hr over 240 Minutes Intravenous 3 times per day 01/05/14 1847     01/05/14 1345  piperacillin-tazobactam (ZOSYN) IVPB 3.375 g     3.375 g 100 mL/hr over 30 Minutes Intravenous  Once 01/05/14 1343 01/05/14 1445        Assessment/Plan Gangrenous Ruptured Appendicitis---Dr. Hulen Skains 01/05/14 POD#1 -continue with IV antibiotics for at least 3 days -allow for clears, monitor for ileus -IV hydration  -Zosyn  -pain control/anti-emetics  -SCD/lovenox -DC foley tomorrow when mobilizing more -mobilize as tolerated  -repeat labs in AM -drain care -follow cultures, GNR AKI-IV hydration, repeat labs in AM.    Erby Pian, ANP-BC [facility] Surgery Pager [phone](7A-4:30P) For consults and floor pages call [phone](7A-4:30P)  01/06/2014 10:30 AM

## 2014-01-06 NOTE — Progress Notes (Signed)
Up in chair. Tolerating clears. Continue Zosyn. Patient examined and I agree with the assessment and plan  Violeta Gelinas, MD, MPH, FACS Trauma: [phone] General Surgery: [phone]  01/06/2014 3:28 PM

## 2014-01-07 ENCOUNTER — Inpatient Hospital Stay (HOSPITAL_COMMUNITY): Payer: BC Managed Care – PPO

## 2014-01-07 LAB — BODY FLUID CULTURE: GRAM STAIN: NONE SEEN

## 2014-01-07 LAB — BASIC METABOLIC PANEL
Anion gap: 12 (ref 5–15)
BUN: 22 mg/dL (ref 6–23)
CALCIUM: 7.6 mg/dL — AB (ref 8.4–10.5)
CO2: 24 meq/L (ref 19–32)
Chloride: 97 mEq/L (ref 96–112)
Creatinine, Ser: 1.28 mg/dL (ref 0.50–1.35)
GFR calc Af Amer: 77 mL/min — ABNORMAL LOW (ref >90)
GFR calc non Af Amer: 67 mL/min — ABNORMAL LOW (ref >90)
GLUCOSE: 134 mg/dL — AB (ref 70–99)
Potassium: 4.2 mEq/L (ref 3.7–5.3)
SODIUM: 133 meq/L — AB (ref 137–147)

## 2014-01-07 LAB — CBC WITH DIFFERENTIAL/PLATELET
Basophils Absolute: 0 10*3/uL (ref 0.0–0.1)
Basophils Relative: 0 % (ref 0–1)
EOS PCT: 0 % (ref 0–5)
Eosinophils Absolute: 0 10*3/uL (ref 0.0–0.7)
HCT: 36.4 % — ABNORMAL LOW (ref 39.0–52.0)
Hemoglobin: 12.6 g/dL — ABNORMAL LOW (ref 13.0–17.0)
LYMPHS ABS: 1.4 10*3/uL (ref 0.7–4.0)
Lymphocytes Relative: 12 % (ref 12–46)
MCH: 27.9 pg (ref 26.0–34.0)
MCHC: 34.6 g/dL (ref 30.0–36.0)
MCV: 80.7 fL (ref 78.0–100.0)
MONO ABS: 1.6 10*3/uL — AB (ref 0.1–1.0)
Monocytes Relative: 14 % — ABNORMAL HIGH (ref 3–12)
Neutro Abs: 8.3 10*3/uL — ABNORMAL HIGH (ref 1.7–7.7)
Neutrophils Relative %: 74 % (ref 43–77)
Platelets: 117 10*3/uL — ABNORMAL LOW (ref 150–400)
RBC: 4.51 MIL/uL (ref 4.22–5.81)
RDW: 13.7 % (ref 11.5–15.5)
WBC Morphology: INCREASED
WBC: 11.3 10*3/uL — ABNORMAL HIGH (ref 4.0–10.5)

## 2014-01-07 MED ORDER — METOPROLOL TARTRATE 1 MG/ML IV SOLN
5.0000 mg | INTRAVENOUS | Status: DC | PRN
Start: 2014-01-07 — End: 2014-01-09
  Administered 2014-01-07 – 2014-01-09 (×5): 5 mg via INTRAVENOUS
  Filled 2014-01-07 (×5): qty 5

## 2014-01-07 MED ORDER — HYDRALAZINE HCL 20 MG/ML IJ SOLN
10.0000 mg | Freq: Four times a day (QID) | INTRAMUSCULAR | Status: DC | PRN
  Administered 2014-01-07 – 2014-01-08 (×2): 10 mg via INTRAVENOUS
  Filled 2014-01-07: qty 0.5
  Filled 2014-01-07 (×2): qty 1

## 2014-01-07 NOTE — Progress Notes (Signed)
Still elevated at 1720H T99.71F, BP 174/122, P 112, RR24, O2Sat 96% at 3L/min.  Denies any chest pain.  C/o bloated abdomen.  MD made aware.  New order for hydralazine  IV for SBP>170 when HR<120.  Administered hydralazine at 1741H.  Will recheck VS in 30 mins.

## 2014-01-07 NOTE — Progress Notes (Signed)
At 2146, Jesus Hunter's vitals were temp-102.2, BP 184/108, HR 140 and RR 45.  5 mg lopressor IV and 650 mg acetaminophen given.  Family instructed on use of incentive spirometer.  The daughter translated to the patient and wife while the patient demonstrated proper use of machine.  He is only able to achieve 700 due to abdominal pain with inspiration.  Instructed to use it 10 times every 1-2 hours while awake.  Follow up vital signs at 2334 were temp 101.6, BP 160/108, HR 103, RR  24 and shallow, Pulse oximetry 96% on 3 liters oxygen.  Family at bedside, will continue to monitor.

## 2014-01-07 NOTE — Progress Notes (Signed)
Elevated BP 180/118 P119 RR24 O2Sat 93% at 3L/min.  MD made aware, new order received for Metoprolol  IV Q4H PRN for SBP>170 or HR>120.  Administered metroprolol  IV at 1620H.

## 2014-01-07 NOTE — Progress Notes (Signed)
2 Days Post-Op  Subjective: Family in room. Able to translate. Stable and alert. When questioned, states that he is coughing and producing sputum. Otherwise he states he feels pretty well. Denies nausea.Not sure how much clear liquids he has taken. Passing flatus. Pain control seems adequate.  Has had fever up to 102.3. Denies chills.Some tachycardia. Adequate urine output.IV at 125 cc per hour. WBC 11,300. Hemoglobin 12.6. Potassium 4.2. Creatinine 1.28, and glucose 134. Remains on IV Zosyn.  Objective: Vital signs in last 24 hours: Temp:  [100.1 F (37.8 C)-103.2 F (39.6 C)] 101 F (38.3 C) (08/29 0622) Pulse Rate:  [136-147] 136 (08/29 0622) Resp:  [17-20] 20 (08/29 0622) BP: (144-157)/(83-93) 151/90 mmHg (08/29 0622) SpO2:  [92 %-94 %] 92 % (08/29 0622) Weight:  [214 lb 15.2 oz (97.5 kg)] 214 lb 15.2 oz (97.5 kg) (08/28 0929)    Intake/Output from previous day: 08/28 0701 - 08/29 0700 In: 4408.3 [I.V.:4258.3; IV Piggyback:150] Out: 550 [Urine:500; Drains:50] Intake/Output this shift:    General appearance:  alert. Cooperative. Answers questions appropriately. Mental status seems normal. Does not appear toxic at all Resp: clear anteriorly. Diminished breath sounds at bases. Vital capacity no more than 700 cc. GI: distended. A little bit tight. Wounds are okay. JP drain clear serous fluid.  Lab Results:  Results for orders placed during the hospital encounter of 01/05/14 (from the past 24 hour(s))  BASIC METABOLIC PANEL     Status: Abnormal   Collection Time    01/07/14  5:24 AM      Result Value Ref Range   Sodium 133 (*) 137 - 147 mEq/L   Potassium 4.2  3.7 - 5.3 mEq/L   Chloride 97  96 - 112 mEq/L   CO2 24  19 - 32 mEq/L   Glucose, Bld 134 (*) 70 - 99 mg/dL   BUN 22  6 - 23 mg/dL   Creatinine, Ser 1.61  0.50 - 1.35 mg/dL   Calcium 7.6 (*) 8.4 - 10.5 mg/dL   GFR calc non Af Amer 67 (*) >90 mL/min   GFR calc Af Amer 77 (*) >90 mL/min   Anion gap 12  5 - 15  CBC  WITH DIFFERENTIAL     Status: Abnormal   Collection Time    01/07/14  5:24 AM      Result Value Ref Range   WBC 11.3 (*) 4.0 - 10.5 K/uL   RBC 4.51  4.22 - 5.81 MIL/uL   Hemoglobin 12.6 (*) 13.0 - 17.0 g/dL   HCT 09.6 (*) 04.5 - 40.9 %   MCV 80.7  78.0 - 100.0 fL   MCH 27.9  26.0 - 34.0 pg   MCHC 34.6  30.0 - 36.0 g/dL   RDW 81.1  91.4 - 78.2 %   Platelets 117 (*) 150 - 400 K/uL   Neutrophils Relative % 74  43 - 77 %   Lymphocytes Relative 12  12 - 46 %   Monocytes Relative 14 (*) 3 - 12 %   Eosinophils Relative 0  0 - 5 %   Basophils Relative 0  0 - 1 %   Neutro Abs 8.3 (*) 1.7 - 7.7 K/uL   Lymphs Abs 1.4  0.7 - 4.0 K/uL   Monocytes Absolute 1.6 (*) 0.1 - 1.0 K/uL   Eosinophils Absolute 0.0  0.0 - 0.7 K/uL   Basophils Absolute 0.0  0.0 - 0.1 K/uL   WBC Morphology INCREASED BANDS (>20% BANDS)  Studies/Results: No results found.  . enoxaparin (LOVENOX) injection  40 mg Subcutaneous Q24H  . pantoprazole (PROTONIX) IV  40 mg Intravenous QHS  . piperacillin-tazobactam (ZOSYN)  IV  3.375 g Intravenous 3 times per day     Assessment/Plan: s/p Procedure(s): APPENDECTOMY LAPAROSCOPIC  Gangrenous Ruptured Appendicitis---Dr. Lindie Spruce 01/05/14  POD#2 -continue with IV antibiotics  ileus - check abd xray -IV hydration  -Zosyn  -SCD/lovenox  -DC foley -mobilize more -repeat labs in AM  -drain care  -follow cultures, GNR   Fever.  Likely atelectasis. Push IS.  Push ambulation. Chest x-ray today.  AKI-IV hydration, repeat labs 8/30.    @  LOS: 2 days    Madeleine Fenn M 01/07/2014  . .prob

## 2014-01-08 ENCOUNTER — Inpatient Hospital Stay (HOSPITAL_COMMUNITY): Payer: BC Managed Care – PPO

## 2014-01-08 DIAGNOSIS — J9600 Acute respiratory failure, unspecified whether with hypoxia or hypercapnia: Secondary | ICD-10-CM

## 2014-01-08 DIAGNOSIS — J189 Pneumonia, unspecified organism: Secondary | ICD-10-CM

## 2014-01-08 DIAGNOSIS — R0989 Other specified symptoms and signs involving the circulatory and respiratory systems: Secondary | ICD-10-CM

## 2014-01-08 DIAGNOSIS — K352 Acute appendicitis with generalized peritonitis, without abscess: Secondary | ICD-10-CM

## 2014-01-08 DIAGNOSIS — I10 Essential (primary) hypertension: Secondary | ICD-10-CM

## 2014-01-08 DIAGNOSIS — N179 Acute kidney failure, unspecified: Secondary | ICD-10-CM

## 2014-01-08 DIAGNOSIS — R0609 Other forms of dyspnea: Secondary | ICD-10-CM

## 2014-01-08 DIAGNOSIS — A419 Sepsis, unspecified organism: Secondary | ICD-10-CM

## 2014-01-08 LAB — BLOOD GAS, ARTERIAL
Acid-Base Excess: 1 mmol/L (ref 0.0–2.0)
Bicarbonate: 24.7 mEq/L — ABNORMAL HIGH (ref 20.0–24.0)
Drawn by: 105521
O2 Content: 4 L/min
O2 Saturation: 93 %
PCO2 ART: 37.4 mmHg (ref 35.0–45.0)
Patient temperature: 98.6
TCO2: 25.9 mmol/L (ref 0–100)
pH, Arterial: 7.436 (ref 7.350–7.450)
pO2, Arterial: 59.6 mmHg — ABNORMAL LOW (ref 80.0–100.0)

## 2014-01-08 LAB — POCT I-STAT 3, ART BLOOD GAS (G3+)
Acid-Base Excess: 2 mmol/L (ref 0.0–2.0)
BICARBONATE: 27.8 meq/L — AB (ref 20.0–24.0)
O2 Saturation: 100 %
PH ART: 7.362 (ref 7.350–7.450)
PO2 ART: 208 mmHg — AB (ref 80.0–100.0)
Patient temperature: 99
TCO2: 29 mmol/L (ref 0–100)
pCO2 arterial: 49.1 mmHg — ABNORMAL HIGH (ref 35.0–45.0)

## 2014-01-08 LAB — CBC
HCT: 38.1 % — ABNORMAL LOW (ref 39.0–52.0)
Hemoglobin: 13.3 g/dL (ref 13.0–17.0)
MCH: 27.7 pg (ref 26.0–34.0)
MCHC: 34.9 g/dL (ref 30.0–36.0)
MCV: 79.4 fL (ref 78.0–100.0)
Platelets: 188 10*3/uL (ref 150–400)
RBC: 4.8 MIL/uL (ref 4.22–5.81)
RDW: 13.7 % (ref 11.5–15.5)
WBC: 18.5 10*3/uL — ABNORMAL HIGH (ref 4.0–10.5)

## 2014-01-08 LAB — TROPONIN I: Troponin I: <0.3 ng/mL (ref <0.30)

## 2014-01-08 LAB — COMPREHENSIVE METABOLIC PANEL
ALK PHOS: 59 U/L (ref 39–117)
ALT: 29 U/L (ref 0–53)
AST: 49 U/L — ABNORMAL HIGH (ref 0–37)
Albumin: 2 g/dL — ABNORMAL LOW (ref 3.5–5.2)
Anion gap: 17 — ABNORMAL HIGH (ref 5–15)
BUN: 25 mg/dL — ABNORMAL HIGH (ref 6–23)
CALCIUM: 8.4 mg/dL (ref 8.4–10.5)
CO2: 22 meq/L (ref 19–32)
Chloride: 95 mEq/L — ABNORMAL LOW (ref 96–112)
Creatinine, Ser: 0.92 mg/dL (ref 0.50–1.35)
GFR calc Af Amer: >90 mL/min (ref >90)
GLUCOSE: 131 mg/dL — AB (ref 70–99)
POTASSIUM: 3.8 meq/L (ref 3.7–5.3)
SODIUM: 134 meq/L — AB (ref 137–147)
Total Bilirubin: 1.2 mg/dL (ref 0.3–1.2)
Total Protein: 6.9 g/dL (ref 6.0–8.3)

## 2014-01-08 LAB — CK TOTAL AND CKMB (NOT AT ARMC)
CK, MB: 2.5 ng/mL (ref 0.3–4.0)
Relative Index: 1.9 (ref 0.0–2.5)
Total CK: 135 U/L (ref 7–232)

## 2014-01-08 LAB — LACTIC ACID, PLASMA: LACTIC ACID, VENOUS: 1.9 mmol/L (ref 0.5–2.2)

## 2014-01-08 LAB — GLUCOSE, CAPILLARY: Glucose-Capillary: 114 mg/dL — ABNORMAL HIGH (ref 70–99)

## 2014-01-08 LAB — MAGNESIUM: MAGNESIUM: 2.2 mg/dL (ref 1.5–2.5)

## 2014-01-08 LAB — PHOSPHORUS: Phosphorus: 1.6 mg/dL — ABNORMAL LOW (ref 2.3–4.6)

## 2014-01-08 LAB — PRO B NATRIURETIC PEPTIDE: Pro B Natriuretic peptide (BNP): 334.9 pg/mL — ABNORMAL HIGH (ref 0–125)

## 2014-01-08 LAB — MRSA PCR SCREENING: MRSA BY PCR: NEGATIVE

## 2014-01-08 MED ORDER — VANCOMYCIN HCL IN DEXTROSE 1-5 GM/200ML-% IV SOLN
1000.0000 mg | Freq: Two times a day (BID) | INTRAVENOUS | Status: DC
  Administered 2014-01-08 – 2014-01-10 (×4): 1000 mg via INTRAVENOUS
  Filled 2014-01-08 (×5): qty 200

## 2014-01-08 MED ORDER — MIDAZOLAM HCL 2 MG/2ML IJ SOLN
INTRAMUSCULAR | Status: AC
  Administered 2014-01-08: 4 mg
  Filled 2014-01-08: qty 4

## 2014-01-08 MED ORDER — VANCOMYCIN HCL 10 G IV SOLR
2000.0000 mg | Freq: Once | INTRAVENOUS | Status: AC
  Administered 2014-01-08: 2000 mg via INTRAVENOUS
  Filled 2014-01-08: qty 2000

## 2014-01-08 MED ORDER — CETYLPYRIDINIUM CHLORIDE 0.05 % MT LIQD
7.0000 mL | Freq: Two times a day (BID) | OROMUCOSAL | Status: DC
  Administered 2014-01-08: 7 mL via OROMUCOSAL

## 2014-01-08 MED ORDER — METOPROLOL TARTRATE 25 MG/10 ML ORAL SUSPENSION
12.5000 mg | Freq: Two times a day (BID) | ORAL | Status: DC
  Administered 2014-01-08: 12.5 mg
  Filled 2014-01-08 (×3): qty 5

## 2014-01-08 MED ORDER — DEXTROSE-NACL 5-0.9 % IV SOLN
INTRAVENOUS | Status: AC
  Administered 2014-01-08: 19:00:00 via INTRAVENOUS

## 2014-01-08 MED ORDER — MIDAZOLAM HCL 2 MG/2ML IJ SOLN
1.0000 mg | INTRAMUSCULAR | Status: DC | PRN
  Administered 2014-01-08 – 2014-01-10 (×9): 2 mg via INTRAVENOUS
  Filled 2014-01-08 (×9): qty 2

## 2014-01-08 MED ORDER — CHLORHEXIDINE GLUCONATE 0.12 % MT SOLN
15.0000 mL | Freq: Two times a day (BID) | OROMUCOSAL | Status: DC
  Administered 2014-01-08 – 2014-01-19 (×21): 15 mL via OROMUCOSAL
  Filled 2014-01-08 (×24): qty 15

## 2014-01-08 MED ORDER — FENTANYL CITRATE 0.05 MG/ML IJ SOLN
INTRAMUSCULAR | Status: AC
  Administered 2014-01-08: 200 ug
  Filled 2014-01-08: qty 4

## 2014-01-08 MED ORDER — SODIUM CHLORIDE 0.9 % IV SOLN
25.0000 ug/h | INTRAVENOUS | Status: DC
  Administered 2014-01-08: 400 ug/h via INTRAVENOUS
  Administered 2014-01-08 – 2014-01-09 (×2): 100 ug/h via INTRAVENOUS
  Administered 2014-01-09: 200 ug/h via INTRAVENOUS
  Administered 2014-01-09: 400 ug/h via INTRAVENOUS
  Administered 2014-01-09: 350 ug/h via INTRAVENOUS
  Administered 2014-01-09: 100 ug/h via INTRAVENOUS
  Administered 2014-01-09: 400 ug/h via INTRAVENOUS
  Administered 2014-01-10: 200 ug/h via INTRAVENOUS
  Filled 2014-01-08 (×6): qty 50

## 2014-01-08 MED ORDER — BISACODYL 10 MG RE SUPP
10.0000 mg | Freq: Two times a day (BID) | RECTAL | Status: AC
  Administered 2014-01-08: 10 mg via RECTAL
  Filled 2014-01-08: qty 1

## 2014-01-08 MED ORDER — CETYLPYRIDINIUM CHLORIDE 0.05 % MT LIQD
7.0000 mL | Freq: Four times a day (QID) | OROMUCOSAL | Status: DC
  Administered 2014-01-09 – 2014-01-18 (×40): 7 mL via OROMUCOSAL

## 2014-01-08 MED ORDER — ETOMIDATE 2 MG/ML IV SOLN
20.0000 mg | Freq: Once | INTRAVENOUS | Status: AC
  Administered 2014-01-08: 20 mg via INTRAVENOUS

## 2014-01-08 MED ORDER — FUROSEMIDE 10 MG/ML IJ SOLN
40.0000 mg | Freq: Two times a day (BID) | INTRAMUSCULAR | Status: DC
  Administered 2014-01-08: 40 mg via INTRAVENOUS
  Filled 2014-01-08: qty 4

## 2014-01-08 MED ORDER — ALBUTEROL SULFATE (2.5 MG/3ML) 0.083% IN NEBU
2.5000 mg | INHALATION_SOLUTION | RESPIRATORY_TRACT | Status: DC
  Administered 2014-01-08 – 2014-01-09 (×7): 2.5 mg via RESPIRATORY_TRACT
  Filled 2014-01-08 (×8): qty 3

## 2014-01-08 MED ORDER — FUROSEMIDE 10 MG/ML IJ SOLN: 40.0000 mg | Freq: Once | INTRAMUSCULAR | Status: DC

## 2014-01-08 MED ORDER — ROCURONIUM BROMIDE 50 MG/5ML IV SOLN
50.0000 mg | Freq: Once | INTRAVENOUS | Status: AC
  Administered 2014-01-08: 50 mg via INTRAVENOUS

## 2014-01-08 MED ORDER — HEPARIN SODIUM (PORCINE) 5000 UNIT/ML IJ SOLN
5000.0000 [IU] | Freq: Three times a day (TID) | INTRAMUSCULAR | Status: DC
  Administered 2014-01-09 – 2014-01-14 (×16): 5000 [IU] via SUBCUTANEOUS
  Filled 2014-01-08 (×20): qty 1

## 2014-01-08 MED ORDER — METOPROLOL TARTRATE 12.5 MG HALF TABLET
12.5000 mg | ORAL_TABLET | Freq: Two times a day (BID) | ORAL | Status: DC
  Administered 2014-01-08: 12.5 mg via ORAL
  Filled 2014-01-08 (×2): qty 1

## 2014-01-08 MED ORDER — LISINOPRIL 10 MG PO TABS
10.0000 mg | ORAL_TABLET | Freq: Every day | ORAL | Status: DC
  Administered 2014-01-08: 10 mg via ORAL
  Filled 2014-01-08: qty 1

## 2014-01-08 MED ORDER — IOHEXOL 300 MG/ML  SOLN
25.0000 mL | INTRAMUSCULAR | Status: AC
  Administered 2014-01-09 (×2): 25 mL via ORAL

## 2014-01-08 NOTE — Procedures (Signed)
Intubation Procedure Note Jesus Hunter [id number] Jul 08, 1970  Procedure: Intubation Indications: Respiratory insufficiency  Procedure Details Consent: Risks of procedure as well as the alternatives and risks of each were explained to the (patient/caregiver).  Consent for procedure obtained. Time Out: Verified patient identification, verified procedure, site/side was marked, verified correct patient position, special equipment/implants available, medications/allergies/relevent history reviewed, required imaging and test results available.  Performed  Maximum sterile technique was used including antiseptics, gloves, hand hygiene and mask.  MAC    Evaluation Hemodynamic Status: BP stable throughout; O2 sats: stable throughout Patient's Current Condition: stable Complications: No apparent complications Patient did tolerate procedure well. Chest X-ray ordered to verify placement.  CXR: pending.   YACOUB,WESAM 01/08/2014

## 2014-01-08 NOTE — Progress Notes (Signed)
3 Days Post-Op  Subjective: Family at bedside and able to translate.  Have problems with fever to 1022, hypertension, heart rate 140 and tachypnea last night. Dr. Janee Morn started him on Lopressor and Apresoline. Incentive spirometer encouraged.  This morning the patient is afebrile but still having some tachypnea and expiratory wheezing.SpO2 94% on 3 L. Possibly producing sputum by history. Chest x-ray yesterday Shows basilar airspace disease and air bronchograms concerning for bibasilar pneumonia or aspiration. Small pleural effusion and pulmonary vascular congestion. That positive fluid balance noted.  Had a small stool. Denies emesis.  There is no lab work this morning.  Objective: Vital signs in last 24 hours: Temp:  [98.7 F (37.1 C)-102.2 F (39 C)] 99.2 F (37.3 C) (08/30 0646) Pulse Rate:  [102-140] 118 (08/30 0646) Resp:  [21-45] 24 (08/30 0646) BP: (158-188)/(99-122) 186/108 mmHg (08/30 0646) SpO2:  [84 %-96 %] 94 % (08/30 0700) Last BM Date: 01/07/14  Intake/Output from previous day: 08/29 0701 - 08/30 0700 In: 3145 [P.O.:170; I.V.:2875; IV Piggyback:100] Out: 2260 [Urine:2150; Drains:110] Intake/Output this shift:   EXAM: General appearance: alert but in some distress from air hunger.  Expiratory wheeze audible. Cooperative. Resp: diminished breath sounds at bases with some egophony. Expiratory wheeze noted bilateral. GI: abdomen is still distended. Somewhat tympanitic. Occasional bowel sounds. No peritoneal signs and relatively soft. JP drainage is clear watery serous fluid.  Lab Results:  No results found for this or any previous visit (from the past 24 hour(s)).   Studies/Results: Dg Chest 2 View  01/07/2014   CLINICAL DATA:  Postoperative fever.  Abdominal pain.  EXAM: CHEST  2 VIEW  COMPARISON:  CT abdomen and pelvis 01/05/2014.  FINDINGS: The heart size is exaggerated by low lung volumes. Bibasilar airspace disease is present. Bilateral pleural effusions  are noted. Pulmonary vascular congestion is evident. Obstructed bowel is evident.  IMPRESSION: 1. Bibasilar airspace disease with air bronchograms concerning for bilateral pneumonia or aspiration. 2. Bilateral pleural effusions and pulmonary vascular congestion.   Electronically Signed   By: Gennette Pac M.D.   On: 01/07/2014 21:04   Dg Abd 2 Views  01/07/2014   CLINICAL DATA:  Distended bowel.  EXAM: ABDOMEN - 2 VIEW  COMPARISON:  01/05/2014 CT  FINDINGS: Gaseous distention of small bowel loops as well as the transverse colon and stomach. Contrast within decompressed descending colon and sigmoid colon. No free air organomegaly. Surgical drain in the right lower quadrant. No acute bony abnormality.  IMPRESSION: Nonspecific bowel gas pattern with gaseous distention of the stomach, small bowel and a transverse colon. Favor ileus. Small bowel dilatation has increased since prior study.   Electronically Signed   By: Charlett Nose M.D.   On: 01/07/2014 21:13    . albuterol  2.5 mg Nebulization Q4H  . antiseptic oral rinse  7 mL Mouth Rinse BID  . bisacodyl  10 mg Rectal BID  . enoxaparin (LOVENOX) injection  40 mg Subcutaneous Q24H  . furosemide  40 mg Intravenous Once  . pantoprazole (PROTONIX) IV  40 mg Intravenous QHS  . piperacillin-tazobactam (ZOSYN)  IV  3.375 g Intravenous 3 times per day     Assessment/Plan: s/p Procedure(s): APPENDECTOMY LAPAROSCOPIC  Gangrenous Ruptured Appendicitis---Dr. Lindie Spruce 01/05/14  POD#3  -continue with IV antibiotics  ileus - Go slow  -Zosyn  -SCD/lovenox  Leave Foley due to planned diuresis. Monitor I and. foley  -mobilize more  -repeat labs in AM  -drain care  -follow cultures, GNR   Respiratory distress. This  may be a combination of fluid overload, pneumonia, or cardiac disease. IV at TKO Add vancomycin Add albuterol inhalers and flutter valve Lasix 40 mg IV now EKG Cardiac enzymes I have spoken to try a hospitalist who were here and will see him  now.  Fever.  Likely Pulmonary source. Exploratory laparoscopy obviously, cannot rule out intra-abdominal source, but pulmonary problems need to be the focus for the next 24 hours. .  AKI-IV hydration, repeat labs 8/31.    @  LOS: 3 days    Samanta Gal M 01/08/2014  . .prob

## 2014-01-08 NOTE — Procedures (Signed)
Central Venous Catheter Insertion Procedure Note Manveer Gomes [id number] 1970-11-13  Procedure: Insertion of Central Venous Catheter Indications: Assessment of intravascular volume and Drug and/or fluid administration  Procedure Details Consent: Unable to obtain consent because of emergent medical necessity. Time Out: Verified patient identification, verified procedure, site/side was marked, verified correct patient position, special equipment/implants available, medications/allergies/relevent history reviewed, required imaging and test results available.  Performed  Maximum sterile technique was used including antiseptics, cap, gloves, gown, hand hygiene, mask and sheet. Skin prep: Chlorhexidine; local anesthetic administered A antimicrobial bonded/coated triple lumen catheter was placed in the left internal jugular vein using the Seldinger technique.  Evaluation Blood flow good Complications: No apparent complications Patient did tolerate procedure well. Chest X-ray ordered to verify placement.  CXR: pending.  U/S used in placement.  Malina Geers 01/08/2014, 5:30 PM

## 2014-01-08 NOTE — Consult Note (Signed)
Medical Consultation   Jesus Hunter  GEX:[id number]  DOB: Dec 04, 1970  DOA: 01/05/2014  PCP: No primary provider on file.  Requesting physician: Dr. Claud Kelp, Surgery  Reason for consultation: Shortness of breath   History of Present Illness: Information detailed below was given by patient however translated by his family who is at bedside.  This is a 43 year old [name] with a past medical history of hypertension, who presented for right lower quadrant abdominal pain x4 days. Pain was sudden in onset. Patient was found a ruptured appendicitis with possible abscess and was taken to surgery. We are consulted for shortness of breath. Upon examination, patient complained of shortness of breath and wheezing with occasional cough. Patient denied any chest pain. He does have some abdominal pain but denied any nausea or vomiting. Patient does have abdominal pain with deep inhalation. Patient denied any history of cardiac disease or lung disease.  Allergies:  No Known Allergies    Past Medical History  Diagnosis Date  . Hypertension   . High cholesterol     Past Surgical History  Procedure Laterality Date  . Appendectomy  01/05/2014    ruptured/notes 01/05/2014  . Laparoscopic appendectomy N/A 01/05/2014    Procedure: APPENDECTOMY LAPAROSCOPIC;  Surgeon: [name], MD;  Location: [facility] OR;  Service: General;  Laterality: N/A;    Social History:  reports that he has quit smoking. His smoking use included Cigarettes. He has a 12 pack-year smoking history. He has never used smokeless tobacco. He reports that he drinks alcohol. He reports that he does not use illicit drugs.  History reviewed. No pertinent family history.  Review of Systems:  Review of Systems:  Constitutional: Denies fever, chills, diaphoresis, appetite change and fatigue.  HEENT: Denies photophobia, eye pain, redness, hearing loss, ear pain, congestion, sore throat, rhinorrhea, sneezing, mouth sores,  trouble swallowing, neck pain, neck stiffness and tinnitus.   Respiratory: Complains of shortness of breath with cough and wheezing. Cardiovascular: Denies chest pain, palpitations and leg swelling.  Gastrointestinal: Complains of some abdominal pain, denies any nausea or vomiting.  Genitourinary: Denies dysuria, urgency, frequency, hematuria, flank pain and difficulty urinating.  Musculoskeletal: Denies myalgias, back pain, joint swelling, arthralgias and gait problem.  Skin: Denies pallor, rash and wound.  Neurological: Denies dizziness, seizures, syncope, weakness, light-headedness, numbness and headaches.  Hematological: Denies adenopathy. Easy bruising, personal or family bleeding history  Psychiatric/Behavioral: Denies suicidal ideation, mood changes, confusion, nervousness, sleep disturbance and agitation   Physical Exam: Blood pressure 186/108, pulse 118, temperature 99.2 F (37.3 C), temperature source Oral, resp. rate 24, height 5' 5.75" (1.67 m), weight 97.5 kg (214 lb 15.2 oz), SpO2 96.00%.   General: Well developed, well nourished, mild distress, appears stated age  HEENT: NCAT, PERRLA, EOMI, Anicteic Sclera, mucous membranes moist.   Neck: Supple, no JVD, no masses  Cardiovascular: S1 S2 auscultated, no rubs, murmurs or gallops. Regular rate and rhythm.  Respiratory: Diminished breath sounds particularly in the lung bases, expiratory wheezing noted.  Abdomen: Soft, nontender, distended, + bowel sounds, JP drain in place with serous fluid  Extremities: warm dry without cyanosis clubbing or edema  Neuro: AAOx3, cranial nerves grossly intact. Strength 5/5 in patient's upper and lower extremities bilaterally  Skin: Without rashes exudates or nodules  Psych: Appropriate mood and affect  Labs on Admission:  Basic Metabolic Panel:  Recent Labs Lab 01/06/14 0557 01/07/14 0524  NA 132* 133*  K 4.7 4.2  CL 95* 97  CO2 23 24  GLUCOSE 118* 134*  BUN 24* 22    CREATININE 1.57* 1.28  CALCIUM 7.6* 7.6*   Liver Function Tests:  Recent Labs Lab 01/05/14 1228  AST 19  ALT 20  ALKPHOS 40  BILITOT 1.5*  PROT 8.1  ALBUMIN 3.1*   No results found for this basename: LIPASE, AMYLASE,  in the last 168 hours No results found for this basename: AMMONIA,  in the last 168 hours CBC:  Recent Labs Lab 01/06/14 0557 01/07/14 0524  WBC 7.1 11.3*  NEUTROABS  --  8.3*  HGB 14.5 12.6*  HCT 42.9 36.4*  MCV 81.9 80.7  PLT 123* 117*   Cardiac Enzymes:  Recent Labs Lab 01/08/14 0938  CKTOTAL 135  CKMB 2.5  TROPONINI <0.30   BNP: No components found with this basename: POCBNP,  CBG: No results found for this basename: GLUCAP,  in the last 168 hours  Inpatient Medications:   Scheduled Meds: . albuterol  2.5 mg Nebulization Q4H  . antiseptic oral rinse  7 mL Mouth Rinse BID  . bisacodyl  10 mg Rectal BID  . enoxaparin (LOVENOX) injection  40 mg Subcutaneous Q24H  . furosemide  40 mg Intravenous Q12H  . lisinopril  10 mg Oral Daily  . metoprolol tartrate  12.5 mg Oral BID  . pantoprazole (PROTONIX) IV  40 mg Intravenous QHS  . piperacillin-tazobactam (ZOSYN)  IV  3.375 g Intravenous 3 times per day  . vancomycin  2,000 mg Intravenous Once  . vancomycin  1,000 mg Intravenous Q12H   Continuous Infusions: . dextrose 5 % and 0.45 % NaCl with KCl 20 mEq/L 10 mL/hr at 01/08/14 0940   Radiological Exams on Admission: Dg Chest 2 View  01/07/2014   CLINICAL DATA:  Postoperative fever.  Abdominal pain.  EXAM: CHEST  2 VIEW  COMPARISON:  CT abdomen and pelvis 01/05/2014.  FINDINGS: The heart size is exaggerated by low lung volumes. Bibasilar airspace disease is present. Bilateral pleural effusions are noted. Pulmonary vascular congestion is evident. Obstructed bowel is evident.  IMPRESSION: 1. Bibasilar airspace disease with air bronchograms concerning for bilateral pneumonia or aspiration. 2. Bilateral pleural effusions and pulmonary vascular  congestion.   Electronically Signed   By: Gennette Pac M.D.   On: 01/07/2014 21:04   Dg Abd 2 Views  01/07/2014   CLINICAL DATA:  Distended bowel.  EXAM: ABDOMEN - 2 VIEW  COMPARISON:  01/05/2014 CT  FINDINGS: Gaseous distention of small bowel loops as well as the transverse colon and stomach. Contrast within decompressed descending colon and sigmoid colon. No free air organomegaly. Surgical drain in the right lower quadrant. No acute bony abnormality.  IMPRESSION: Nonspecific bowel gas pattern with gaseous distention of the stomach, small bowel and a transverse colon. Favor ileus. Small bowel dilatation has increased since prior study.   Electronically Signed   By: Charlett Nose M.D.   On: 01/07/2014 21:13    Impression/Recommendations  Sepsis secondary to pneumonia -Patient febrile with temperature 102.2, leukocytosis of 11.3, tachypneic as well as tachycardic -UA: Not convincing for infection, trace leukocytes -Chest x-ray: Bibasilar airspace disease with air bronchograms, concerning for bilateral pneumonia or aspiration -Continue vancomycin and Zosyn  -Will order blood cultures  Dyspnea -Possibly secondary to volume overload as well as pneumonia -Patient has no history of heart failure, does not appear to be volume overloaded however will obtain a BNP level as well as an echocardiogram. -Will place patient on Lasix 40 mg IV twice daily -Will monitor his daily  weights as well as intake and output -Patient has received over 5 L of fluid within the past few days -Patient denies any chest pain, troponin negative will continue to monitor and cycle -Will order nebulizer treatments.  Postoperative fever -Likely secondary to pneumonia -Treatment and plan as above  Uncontrolled hypertension -Will add on lisinopril and metoprolol  Ruptured appendicitis  -Managed by surgery, and drain in place.  I will followup again in the morning. Please contact me if I can be of assistance in the  meanwhile. Thank you for this consultation.  Time Spent: 60 minutes  Deovion Batrez D.O. [facility] 01/08/2014, 11:28 AM

## 2014-01-08 NOTE — Progress Notes (Addendum)
Pt's abdomen distended and taut; out of OGT in one hour prior to change of shift at 1900; spoke to Dr Byerly/CCS to discuss MD order for abd CT WITH oral contrast; per Dr Donell Beers, continue OGT to LIWS until 4 am. At that time, begin slow administration of oral contrast per OGT at rate ~150/hr.... Marvia Pickles, RN  9:13 PM... Notified Chris in CT of MD instruction and plan for CT in the morning after full admin of oral contrast... Marvia Pickles, RN

## 2014-01-08 NOTE — Significant Event (Signed)
Rapid Response Event Note  Overview: Time Called: 1544 Arrival Time: 1546 Event Type: Respiratory;Other (Comment)  Initial Focused Assessment:  Called by primary RN for Pateint with SOB, diaphoretic and abnormal EKG reading.  Upon my arrival to patient's room, RN and RT and family at bedside.  RT just gave breathing treatment with no relief.  Patient is sitting up in bed RR 40, diaphoretic, SOB on nasal cannula 4 lpm Sat 92-94%.  Patient does not speak english, family translating.  Patient points to epigastric area and states he has a lot of pain.  When questioned further he says his whole abd hurts and feels very tight.  ABD is very tight and distended, has JP drain.    Interventions:  Breath sounds b/l wheezes, ABD tight and distended, BP 185/110 HR 120's, 5 mg IV lopressor given, BP rechecked 174/111, HR 112.  EKG redone, reviewed.  PCXR and ABG ordered as per protocol.  Dr. Rennis Petty at bedside, orders received.  KUB and labs done.  CCS called and notified.  ICU bed requested.  Dr. Molli Knock at bedside    Event Summary:  Patient transported to [facility] 04 via bed with monitor and oxygen   at      at          [facility], Jesus Hunter

## 2014-01-08 NOTE — Consult Note (Signed)
PULMONARY / CRITICAL CARE MEDICINE   Name: Jesus Hunter MRN: [id number] DOB: [birth year]    ADMISSION DATE:  01/05/2014 CONSULTATION DATE:  8/30  REFERRING MD :  Catha Gosselin  CHIEF COMPLAINT:  Respiratory distress.   INITIAL PRESENTATION:  43 year old [name] with a past medical history of hypertension, who presented 8/27 w/ ruptured appendicitis with possible abscess and was taken to surgery for laparoscopic appendectomy and drainage of intra-abd abscess. On 8/29 thru 8/30 developed increased resp distress, tachycardia, hypertension and persistent fever. Still c/o abd pain. Moved to the ICU for resp distress, SIRS and sepsis.   STUDIES:  CT abd/pelvis 8/27: 1. Ruptured appendicitis with scattered air-fluid levels in the right lower quadrant. 2. Potentially encapsulated fluid extending from the lower right lower quadrant into the pelvis.3. Marked secondary inflammatory changes of the distal small bowel.4. Bibasilar airspace disease likely reflects atelectasis. CT abd pelvis 8/30>>>   SIGNIFICANT EVENTS:  8/27 for right lower quadrant abdominal pain x4 days. Pain was sudden in onset. Patient was found a ruptured appendicitis with possible abscess and was taken to surgery for laparoscopic appendectomy and drainage of intra-abd abscess. Returned to med floor w/ abscess drain in place.   8/29 spiking fever. Having productive cough. CXR w/ bilateral ATX. 8/30: transferred to ICU w/ increased abd pain, resp distress, SIRS/sepsis   HISTORY OF PRESENT ILLNESS:   43 year old [name] with a past medical history of hypertension, who presented 8/27 for right lower quadrant abdominal pain x4 days. Pain was sudden in onset. Patient was found a ruptured appendicitis with possible abscess and was taken to surgery for laparoscopic appendectomy and drainage of intra-abd abscess. 8/28 up in chair. Taking clears. Abscess culture growing abd GNR, GPR, and GPC. On 8/29 spiking fever. Having productive  cough. CXR w/ bilateral ATX. 8/30 developing increased resp distress, tachycardia, hypertension and persistent fever. Still c/o abd pain. Moved to the ICU for resp distress, SIRS and sepsis.   PAST MEDICAL HISTORY :  Past Medical History  Diagnosis Date  . Hypertension   . High cholesterol    Past Surgical History  Procedure Laterality Date  . Appendectomy  01/05/2014    ruptured/notes 01/05/2014  . Laparoscopic appendectomy N/A 01/05/2014    Procedure: APPENDECTOMY LAPAROSCOPIC;  Surgeon: [name], MD;  Location: [facility] OR;  Service: General;  Laterality: N/A;   Prior to Admission medications   Medication Sig Start Date End Date Taking? Authorizing Provider  acetaminophen (TYLENOL) 325 MG tablet Take 325 mg by mouth every 4 (four) hours as needed (pain).   Yes Historical Provider, MD   No Known Allergies  FAMILY HISTORY:  History reviewed. No pertinent family history. SOCIAL HISTORY:  reports that he has quit smoking. His smoking use included Cigarettes. He has a 12 pack-year smoking history. He has never used smokeless tobacco. He reports that he drinks alcohol. He reports that he does not use illicit drugs.  REVIEW OF SYSTEMS:  Unable   SUBJECTIVE:  In acute distress.  VITAL SIGNS: Temp:  [98.2 F (36.8 C)-102.2 F (39 C)] 99 F (37.2 C) (08/30 1601) Pulse Rate:  [103-140] 130 (08/30 1601) Resp:  [24-45] 30 (08/30 1601) BP: (160-188)/(99-122) 185/110 mmHg (08/30 1601) SpO2:  [84 %-96 %] 93 % (08/30 1601) HEMODYNAMICS:   VENTILATOR SETTINGS:   INTAKE / OUTPUT:  Intake/Output Summary (Last 24 hours) at 01/08/14 1707 Last data filed at 01/08/14 1418  Gross per 24 hour  Intake   3335 ml  Output   3285 ml  Net     50 ml    PHYSICAL EXAMINATION: General:  Acute distress. C/o abd pain and SOB Neuro:  Awake, non focal def. Non-english speaking  HEENT:  Mm dry no JVD Cardiovascular:  Tachy rrr  Lungs:  Decreased + accessory muscle use  Abdomen:  Distended,  hypoactive. Lap incision unremarkable. Abscess drain RQ serous drainage  Musculoskeletal:  Intact  Skin:  Intact   LABS:  CBC  Recent Labs Lab 01/06/14 0557 01/07/14 0524 01/08/14 1615  WBC 7.1 11.3* 18.5*  HGB 14.5 12.6* 13.3  HCT 42.9 36.4* 38.1*  PLT 123* 117* 188   Coag's No results found for this basename: APTT, INR,  in the last 168 hours BMET  Recent Labs Lab 01/05/14 1228 01/06/14 0557 01/07/14 0524  NA 132* 132* 133*  K 3.5* 4.7 4.2  CL 90* 95* 97  CO2 BUN 28* 24* 22  CREATININE 1.24 1.57* 1.28  GLUCOSE 117* 118* 134*   Electrolytes  Recent Labs Lab 01/05/14 1228 01/06/14 0557 01/07/14 0524  CALCIUM 8.6 7.6* 7.6*   Sepsis Markers No results found for this basename: LATICACIDVEN, PROCALCITON, O2SATVEN,  in the last 168 hours ABG  Recent Labs Lab 01/08/14 1608  PHART 7.436  PCO2ART 37.4  PO2ART 59.6*   Liver Enzymes  Recent Labs Lab 01/05/14 1228  AST 19  ALT 20  ALKPHOS 40  BILITOT 1.5*  ALBUMIN 3.1*   Cardiac Enzymes  Recent Labs Lab 01/08/14 0938 01/08/14 1610  TROPONINI <0.30 <0.30  PROBNP 334.9*  --    Glucose No results found for this basename: GLUCAP,  in the last 168 hours  Imaging Dg Chest 2 View  01/07/2014   CLINICAL DATA:  Postoperative fever.  Abdominal pain.  EXAM: CHEST  2 VIEW  COMPARISON:  CT abdomen and pelvis 01/05/2014.  FINDINGS: The heart size is exaggerated by low lung volumes. Bibasilar airspace disease is present. Bilateral pleural effusions are noted. Pulmonary vascular congestion is evident. Obstructed bowel is evident.  IMPRESSION: 1. Bibasilar airspace disease with air bronchograms concerning for bilateral pneumonia or aspiration. 2. Bilateral pleural effusions and pulmonary vascular congestion.   Electronically Signed   By: Gennette Pac M.D.   On: 01/07/2014 21:04   Dg Abd 2 Views  01/07/2014   CLINICAL DATA:  Distended bowel.  EXAM: ABDOMEN - 2 VIEW  COMPARISON:  01/05/2014 CT   FINDINGS: Gaseous distention of small bowel loops as well as the transverse colon and stomach. Contrast within decompressed descending colon and sigmoid colon. No free air organomegaly. Surgical drain in the right lower quadrant. No acute bony abnormality.  IMPRESSION: Nonspecific bowel gas pattern with gaseous distention of the stomach, small bowel and a transverse colon. Favor ileus. Small bowel dilatation has increased since prior study.   Electronically Signed   By: Charlett Nose M.D.   On: 01/07/2014 21:13     ASSESSMENT / PLAN:  PULMONARY OETT 8/30>>> A: VDRF due to severe ileus and patient is breathing 45 times a minute and desaturating.  I am also concerned for evolving ARDS given hypoxemia. P:   - Intubate. - Mechanically ventilate. - Titrate O2 for sats. - F/U ABG and CXR. - CXR and ABG in AM. - Adjust vent to ABG. - Monitor for signs of aspiration.  CARDIOVASCULAR CVL L IJ TLC 8/30>>> A: Severe HTN with respiratory failure. P:  - Will sedate and intubate prior to treatment of HTN as I suspect this is only due to  respiratory distress. - If remains HTN after sedation will consider beta blockers.  RENAL A:  Cr increased slightly but improved with hydration. P:   - Hold diuretics. - Change IVF to D5NS at 75 ml/hr.. - Start TPN per pharmacy. - BMET in AM. - Replace electrolytes as indicated.  GASTROINTESTINAL A:  Ruptured appendicitis now with severe ileus. P:   - CT of the abdomen today as ordered by CCS. - OGT to suction. - NPO. - TPN per pharmacy.  HEMATOLOGIC A:  Leukocytosis. P:  - See ID section. - Daily CBC.  INFECTIOUS A:   Appendicitis  Peritonitis w/ abd abscess S/p appendectomy and abscess drainage 8/27 Severe sepsis   P:   BCx2 8/30>>> Abscess 8/27: ABUNDANT GRAM NEGATIVE RODS, ABUNDANT GRAM POSITIVE RODS, MODERATE GRAM POSITIVE COCCI>>> Abx: zosyn, start date 8/27 >>> ABX vanc start date 8/30>>>  ENDOCRINE A:  Mild hyperglycemia  P:    Trend glucose  Add ssi if > 150   NEUROLOGIC A:  Pain  P:   RASS goal: -1 to -2  PAD protocol   TODAY'S SUMMARY: 43 year old with ruptured appendicitis, presenting with respiratory failure due to severe ileus and concern for evolving ARDS.  Will transfer to the ICU, intubate, mechanically ventilate, decompress abdomen, add vancomycin, IVF changed as ordered.  F/U ABG and CXR.  I have personally obtained a history, examined the patient, evaluated laboratory and imaging results, formulated the assessment and plan and placed orders.  CRITICAL CARE: The patient is critically ill with multiple organ systems failure and requires high complexity decision making for assessment and support, frequent evaluation and titration of therapies, application of advanced monitoring technologies and extensive interpretation of multiple databases. Critical Care Time devoted to patient care services described in this note is 45 minutes.   Alyson Reedy, M.D. [facility] Pulmonary/Critical Care Medicine. Pager: [phone]. After hours pager: [phone].  01/08/2014, 5:07 PM

## 2014-01-08 NOTE — Progress Notes (Addendum)
ANTIBIOTIC CONSULT NOTE - INITIAL  Pharmacy Consult for vancomycin Indication: pneumonia  No Known Allergies  Patient Measurements: Height: 5' 5.75" (167 cm) Weight: 214 lb 15.2 oz (97.5 kg) IBW/kg (Calculated) : 63.22  Vital Signs: Temp: 99.2 F (37.3 C) (08/30 0646) Temp src: Oral (08/30 0646) BP: 186/108 mmHg (08/30 0646) Pulse Rate: 118 (08/30 0646) Intake/Output from previous day: 08/29 0701 - 08/30 0700 In: 3145 [P.O.:170; I.V.:2875; IV Piggyback:100] Out: 2260 [Urine:2150; Drains:110] Intake/Output from this shift:    Labs:  Recent Labs  01/05/14 1228 01/06/14 0557 01/07/14 0524  WBC 8.0 7.1 11.3*  HGB 15.8 14.5 12.6*  PLT 173 123* 117*  CREATININE 1.24 1.57* 1.28   Estimated Creatinine Clearance: 80.1 ml/min (by C-G formula based on Cr of 1.28). No results found for this basename: VANCOTROUGH, VANCOPEAK, VANCORANDOM, GENTTROUGH, GENTPEAK, GENTRANDOM, TOBRATROUGH, TOBRAPEAK, TOBRARND, AMIKACINPEAK, AMIKACINTROU, AMIKACIN,  in the last 72 hours   Microbiology: Recent Results (from the past 720 hour(s))  BODY FLUID CULTURE     Status: None   Collection Time    01/05/14  4:27 PM      Result Value Ref Range Status   Specimen Description PERITONEAL FLUID   Final   Special Requests PATIENT ON FOLLOWING ZOSYN RECEIVED SWAB   Final   Gram Stain     Final   Value: NO WBC SEEN     ABUNDANT GRAM NEGATIVE RODS     ABUNDANT GRAM POSITIVE RODS     MODERATE GRAM POSITIVE COCCI     IN PAIRS Gram Stain Report Called to,Read Back By and Verified With: Gram Stain Report Called to,Read Back By and Verified With: Purcell Nails RN 01/06/14 0740AM BY MILSH   Culture     Final   Value: MULTIPLE ORGANISMS PRESENT, NONE PREDOMINANT     Performed at [facility]   Report Status 01/07/2014 FINAL   Final  ANAEROBIC CULTURE     Status: None   Collection Time    01/05/14  4:27 PM      Result Value Ref Range Status   Specimen Description PERITONEAL FLUID   Final   Special  Requests PATIENT ON FOLLOWING ZOSYN RECEIVED SWAB   Final   Gram Stain     Final   Value: NO WBC SEEN     ABUNDANT GRAM NEGATIVE RODS     ABUNDANT GRAM POSITIVE RODS     MODERATE GRAM POSITIVE COCCI     IN PAIRS   Culture     Final   Value: NO ANAEROBES ISOLATED; CULTURE IN PROGRESS FOR 5 DAYS     Performed at [facility]   Report Status PENDING   Incomplete    Medical History: Past Medical History  Diagnosis Date  . Hypertension   . High cholesterol     Medications:  On acetaminophen PTA.  Has been on Zosyn since 8/27 for ruptured appendicitis.    Assessment: 43 year old man s/p appendectomy on 8/27 for ruptured appendicitis.  He has developed respiratory distress today and vancomycin is ordered for pneumonia.  Zosyn continues.   The peritoneal fluid culture from 8/27 yielded multiple organisms. Creatinine was 1.28 on 8/29, down from 1.57 on 8/28.   Goal of Therapy:  Vancomycin trough level 15-20 mcg/ml  Plan:  Vancomycin 2g IV x 1 dose, then 1g IV q12 Continue Zosyn at the same dose. Will follow any additional cultures, renal function, and order trough levels as needed.  Mickeal Skinner 01/08/2014,9:06 AM     =========================================  Addendum: - TPN for prolonged ileus - CCM placing central line   Plan: - Start TPN tomorrow 01/09/14 - CMP, CBC, Mag, Phos in AM    Cola Gane D. Laney Potash, PharmD, BCPS Pager:  [phone] 01/08/2014, 5:37 PM

## 2014-01-08 NOTE — Progress Notes (Signed)
Made aware of from [facility], Respiratory therapist that patient was having respiratory distress breathing 40bpm. Went to assess patient. Notified MD and Rapid reponse. RRT Angelique Blonder responded and came up to floor. MD also present shortly after from Internal Medicine. Orders made for patient. Patient diaphoretic, VS reading BP 185/110, P 130, increased respirations, O2 at 93 on 4L [place]. Patient stated that he was having some chest pain at first but currently stated that the pain is from his abdomen and pointed to it. Patient does not speak [place] and had family translating. EKG ordered. Administered  of metoprolol at 1606 pm. CCS MD paged also. Patient ordered to be transfer ICU. Continuing to monitor patient.

## 2014-01-08 NOTE — Progress Notes (Signed)
Patient having respiratory distress.  Rapid response called.  Patient's first EKG, poor baseline, shows questionable ST elevation.  Ordered stat repeat EKG, CXR, CT abd, labs including lactic acid, trop, CBC, CMP, ABG.  Patient currently complains of abdominal pain.  Spoke Dr. Donell Beers (surgery) who requested patient be moved the ICU.  Spoke with Dr. Katrinka Blazing (PCCM) who has accepted the patient.  Transfer orders placed.

## 2014-01-09 ENCOUNTER — Inpatient Hospital Stay (HOSPITAL_COMMUNITY): Payer: BC Managed Care – PPO

## 2014-01-09 ENCOUNTER — Encounter (HOSPITAL_COMMUNITY): Payer: Self-pay | Admitting: *Deleted

## 2014-01-09 DIAGNOSIS — R0989 Other specified symptoms and signs involving the circulatory and respiratory systems: Secondary | ICD-10-CM

## 2014-01-09 DIAGNOSIS — R0609 Other forms of dyspnea: Secondary | ICD-10-CM

## 2014-01-09 DIAGNOSIS — E8779 Other fluid overload: Secondary | ICD-10-CM

## 2014-01-09 DIAGNOSIS — I319 Disease of pericardium, unspecified: Secondary | ICD-10-CM

## 2014-01-09 LAB — COMPREHENSIVE METABOLIC PANEL
ALBUMIN: 1.8 g/dL — AB (ref 3.5–5.2)
ALT: 25 U/L (ref 0–53)
AST: 31 U/L (ref 0–37)
Alkaline Phosphatase: 46 U/L (ref 39–117)
Anion gap: 11 (ref 5–15)
BUN: 25 mg/dL — ABNORMAL HIGH (ref 6–23)
CALCIUM: 7.6 mg/dL — AB (ref 8.4–10.5)
CO2: 27 mEq/L (ref 19–32)
Chloride: 100 mEq/L (ref 96–112)
Creatinine, Ser: 1.09 mg/dL (ref 0.50–1.35)
GFR calc Af Amer: >90 mL/min (ref >90)
GFR calc non Af Amer: 81 mL/min — ABNORMAL LOW (ref >90)
Glucose, Bld: 98 mg/dL (ref 70–99)
Potassium: 3.7 mEq/L (ref 3.7–5.3)
Sodium: 138 mEq/L (ref 137–147)
TOTAL PROTEIN: 5.9 g/dL — AB (ref 6.0–8.3)
Total Bilirubin: 1.3 mg/dL — ABNORMAL HIGH (ref 0.3–1.2)

## 2014-01-09 LAB — BLOOD GAS, ARTERIAL
Acid-Base Excess: 3.7 mmol/L — ABNORMAL HIGH (ref 0.0–2.0)
Bicarbonate: 28.3 mEq/L — ABNORMAL HIGH (ref 20.0–24.0)
Drawn by: 41881
FIO2: 0.4 %
MECHVT: 440 mL
O2 Saturation: 98.2 %
PCO2 ART: 49.8 mmHg — AB (ref 35.0–45.0)
PEEP/CPAP: 5 cmH2O
PO2 ART: 106 mmHg — AB (ref 80.0–100.0)
Patient temperature: 100.1
RATE: 18 resp/min
TCO2: 29.7 mmol/L (ref 0–100)
pH, Arterial: 7.377 (ref 7.350–7.450)

## 2014-01-09 LAB — CBC
HCT: 33.6 % — ABNORMAL LOW (ref 39.0–52.0)
Hemoglobin: 11.2 g/dL — ABNORMAL LOW (ref 13.0–17.0)
MCH: 27.3 pg (ref 26.0–34.0)
MCHC: 33.3 g/dL (ref 30.0–36.0)
MCV: 81.8 fL (ref 78.0–100.0)
Platelets: 193 10*3/uL (ref 150–400)
RBC: 4.11 MIL/uL — ABNORMAL LOW (ref 4.22–5.81)
RDW: 14.3 % (ref 11.5–15.5)
WBC: 15.1 10*3/uL — ABNORMAL HIGH (ref 4.0–10.5)

## 2014-01-09 LAB — TROPONIN I: Troponin I: <0.3 ng/mL (ref <0.30)

## 2014-01-09 LAB — MAGNESIUM: Magnesium: 2.5 mg/dL (ref 1.5–2.5)

## 2014-01-09 LAB — GLUCOSE, CAPILLARY
GLUCOSE-CAPILLARY: 99 mg/dL (ref 70–99)
GLUCOSE-CAPILLARY: 136 mg/dL — AB (ref 70–99)
Glucose-Capillary: 112 mg/dL — ABNORMAL HIGH (ref 70–99)

## 2014-01-09 LAB — PHOSPHORUS: PHOSPHORUS: 1.7 mg/dL — AB (ref 2.3–4.6)

## 2014-01-09 MED ORDER — INSULIN ASPART 100 UNIT/ML ~~LOC~~ SOLN
0.0000 [IU] | Freq: Four times a day (QID) | SUBCUTANEOUS | Status: DC
  Administered 2014-01-10 – 2014-01-11 (×3): 1 [IU] via SUBCUTANEOUS
  Administered 2014-01-11: 2 [IU] via SUBCUTANEOUS
  Administered 2014-01-11 – 2014-01-14 (×11): 1 [IU] via SUBCUTANEOUS
  Administered 2014-01-14 – 2014-01-15 (×3): 2 [IU] via SUBCUTANEOUS
  Administered 2014-01-15 – 2014-01-16 (×3): 1 [IU] via SUBCUTANEOUS
  Administered 2014-01-16: 2 [IU] via SUBCUTANEOUS

## 2014-01-09 MED ORDER — DEXTROSE-NACL 5-0.9 % IV SOLN: INTRAVENOUS | Status: DC

## 2014-01-09 MED ORDER — DEXMEDETOMIDINE HCL IN NACL 400 MCG/100ML IV SOLN
0.4000 ug/kg/h | INTRAVENOUS | Status: DC
  Administered 2014-01-09 – 2014-01-11 (×10): 1.2 ug/kg/h via INTRAVENOUS
  Administered 2014-01-11: 1.1 ug/kg/h via INTRAVENOUS
  Filled 2014-01-09 (×10): qty 100

## 2014-01-09 MED ORDER — FAT EMULSION 20 % IV EMUL
250.0000 mL | INTRAVENOUS | Status: AC
  Administered 2014-01-09: 250 mL via INTRAVENOUS
  Filled 2014-01-09: qty 250

## 2014-01-09 MED ORDER — ALBUTEROL SULFATE (2.5 MG/3ML) 0.083% IN NEBU: 2.5000 mg | INHALATION_SOLUTION | RESPIRATORY_TRACT | Status: DC | PRN

## 2014-01-09 MED ORDER — SODIUM PHOSPHATE 3 MMOLE/ML IV SOLN
20.0000 mmol | Freq: Once | INTRAVENOUS | Status: AC
  Administered 2014-01-09: 20 mmol via INTRAVENOUS
  Filled 2014-01-09: qty 6.67

## 2014-01-09 MED ORDER — POTASSIUM CHLORIDE 10 MEQ/50ML IV SOLN
10.0000 meq | INTRAVENOUS | Status: AC
  Administered 2014-01-09 (×4): 10 meq via INTRAVENOUS
  Filled 2014-01-09: qty 50

## 2014-01-09 MED ORDER — TRACE MINERALS CR-CU-F-FE-I-MN-MO-SE-ZN IV SOLN
INTRAVENOUS | Status: AC
  Administered 2014-01-09: 17:00:00 via INTRAVENOUS
  Filled 2014-01-09: qty 1000

## 2014-01-09 MED ORDER — FENTANYL CITRATE 0.05 MG/ML IJ SOLN
50.0000 ug | INTRAMUSCULAR | Status: DC | PRN
  Administered 2014-01-09 – 2014-01-10 (×3): 100 ug via INTRAVENOUS

## 2014-01-09 MED ORDER — DEXMEDETOMIDINE HCL IN NACL 200 MCG/50ML IV SOLN
0.0000 ug/kg/h | INTRAVENOUS | Status: DC
  Administered 2014-01-09: 1 ug/kg/h via INTRAVENOUS
  Administered 2014-01-09: 1.2 ug/kg/h via INTRAVENOUS
  Administered 2014-01-09: 1 ug/kg/h via INTRAVENOUS
  Administered 2014-01-09: 1.2 ug/kg/h via INTRAVENOUS
  Filled 2014-01-09 (×5): qty 50

## 2014-01-09 MED ORDER — NOREPINEPHRINE BITARTRATE 1 MG/ML IV SOLN
2.0000 ug/min | INTRAVENOUS | Status: DC
  Filled 2014-01-09 (×2): qty 4

## 2014-01-09 MED ORDER — IOHEXOL 300 MG/ML  SOLN
100.0000 mL | Freq: Once | INTRAMUSCULAR | Status: AC | PRN
  Administered 2014-01-09: 100 mL via INTRAVENOUS

## 2014-01-09 NOTE — Progress Notes (Signed)
  Echocardiogram 2D Echocardiogram has been performed.  Leta Jungling M 01/09/2014, 12:05 PM

## 2014-01-09 NOTE — Progress Notes (Signed)
[facility] ADULT ICU REPLACEMENT PROTOCOL FOR AM LAB REPLACEMENT ONLY  The patient does apply for the [facility] Adult ICU Electrolyte Replacment Protocol based on the criteria listed below:   1. Is GFR >/= 40 ml/min? Yes.    Patient's GFR today is >90 2. Is urine output >/= 0.5 ml/kg/hr for the last 6 hours? Yes.   Patient's UOP is 0.5 ml/kg/hr 3. Is BUN < 60 mg/dL? Yes.    Patient's BUN today is 25 4. Abnormal electrolyte(s): Phos 1.7 5. Ordered repletion with: per protocol 6. If a panic level lab has been reported, has the CCM MD in charge been notified? No..   Physician:    Markus Daft A 01/09/2014 6:38 AM

## 2014-01-09 NOTE — Progress Notes (Signed)
PARENTERAL NUTRITION CONSULT NOTE - INITIAL  Pharmacy Consult for TPN Indication: prolonged ileus  No Known Allergies  Patient Measurements: Height:  (157.5 cm) Weight: 168 lb 14 oz (76.6 kg) IBW/kg (Calculated) : 54.6 Adjusted Body Weight: 61.2 kg   Vital Signs: Temp: 100.9 F (38.3 C) (08/31 0850) Temp src: Oral (08/31 0850) BP: 125/67 mmHg (08/31 1000) Pulse Rate: 107 (08/31 1000) Intake/Output from previous day: 08/30 0701 - 08/31 0700 In: 2499 [P.O.:360; I.V.:1414; NG/GT:375; IV Piggyback:350] Out: 3845 [Urine:2775; Emesis/NG output:920; Drains:150] Intake/Output from this shift: Total I/O In: 300 [I.V.:75; NG/GT:225] Out: 225 [Urine:225]  Labs:  Recent Labs  01/07/14 0524 01/08/14 1615 01/09/14 0505  WBC 11.3* 18.5* 15.1*  HGB 12.6* 13.3 11.2*  HCT 36.4* 38.1* 33.6*  PLT 117* 188 193     Recent Labs  01/07/14 0524 01/08/14 1615 01/09/14 0505  NA 133* 134* 138  K 4.2 3.8 3.7  CL 97 95* 100  CO2 GLUCOSE 134* 131* 98  BUN 22 25* 25*  CREATININE 1.28 0.92 1.09  CALCIUM 7.6* 8.4 7.6*  MG  --  2.2 2.5  PHOS  --  1.6* 1.7*  PROT  --  6.9 5.9*  ALBUMIN  --  2.0* 1.8*  AST  --  49* 31  ALT  --  29 25  ALKPHOS  --  59 46  BILITOT  --  1.2 1.3*   Estimated Creatinine Clearance: 77.6 ml/min (by C-G formula based on Cr of 1.09).    Recent Labs  01/08/14 1833  GLUCAP 114*    Medical History: Past Medical History  Diagnosis Date  . Hypertension   . High cholesterol     Medications:  Prescriptions prior to admission  Medication Sig Dispense Refill  . acetaminophen (TYLENOL) 325 MG tablet Take 325 mg by mouth every 4 (four) hours as needed (pain).        Insulin Requirements in the past 24 hours:  none  Current Nutrition:  NPO  Assessment: 43yo M s/p gangrenous ruptured appendicitis. POD #4.    GI: ileus, NGT to LWIS, 920 out NGT. Has IV PPI.   Lytes: K 3.7, phos 1.7, mag 2.5, MD ordered 4 k runs, 20 mM na phos.    Nutritional Goals:  Per RD IV Access: CVC triple lumen LIJ placed 8/30 TPN day # 0   (8/31>>   )  Plan:  Start Clinimix E 5/15 at 42 ml/hr plus lipids at 10 mls/hr to provide 50 gm of protein and 1196 kcals. MD ordered 4 runs of K and Naphos 20 mMols  Decrease IVF of D5NS at 75 to [facility] ml/hr when new TPN started  Empiric SSI q6h  Herby Abraham, Pharm.D. [phone] 01/09/2014 11:29 AM

## 2014-01-09 NOTE — Progress Notes (Signed)
Likely respiratory source of wbc and issues, ct pending per dr Donell Beers, agree with above, hopefully extubate today

## 2014-01-09 NOTE — Progress Notes (Signed)
Patient ID: Jesus Hunter, male   DOB: Sep 05, 1970, 43 y.o.   MRN: [id number]     [address] [place], [place] [address]    Phone: [phone] FAX: [phone]     Subjective: T max 100.1.  Still tachycardic, but improved.  Stable BP.  940m NGT, BM.  Adequate UOP.  WBC trending down.  sCr normalized.   Objective:  Vital signs:  Filed Vitals:   01/09/14 0400 01/09/14 0421 01/09/14 0500 01/09/14 0600  BP: 105/61  130/83 120/67  Pulse: 98  104 103  Temp:  98.8 F (37.1 C)    TempSrc:  Oral    Resp: _0 Height:      Weight:   168 lb 14 oz (76.6 kg)   SpO2: 97%  99% 98%    Last BM Date: 01/08/14  Intake/Output   Yesterday:  08/30 0701 - 08/31 0700 In: 2384 [P.O.:360; I.V.:1299; NG/GT:375; IV Piggyback:350] Out: 32957[Urine:2775; Emesis/NG output:920; Drains:150] This shift:    I/O last 3 completed shifts: In: 3974 [P.O.:410; I.V.:2789; NG/GT:375; IV Piggyback:400] Out: 54734[Urine:3925; Emesis/NG output:920; Drains:245] Total I/O In: 75 [NG/GT:75] Out: -    Physical Exam: General: Pt awake on vent Chest: cta. No chest wall pain w good excursion CV:  Pulses intact.  Regular rhythm.  Tachy Abdomen: Soft.  distended. Mildly tender at incisions only.  RLQ JP drain with serosanguinous output. No evidence of peritonitis.  No incarcerated hernias. Ext:  SCDs BLE.  No mjr edema.  No cyanosis Skin: No petechiae / purpura   Problem List:   Active Problems:   Acute perforated appendicitis   Acute respiratory failure   HTN (hypertension)   Sepsis    Results:   Labs: Results for orders placed during the hospital encounter of 01/05/14 (from the past 48 hour(s))  TROPONIN I     Status: None   Collection Time    01/08/14  9:38 AM      Result Value Ref Range   Troponin I <0.30  <0.30 ng/mL   Comment:            Due to the release kinetics of cTnI,     a negative result within the first hours    of the onset of symptoms does not rule out     myocardial infarction with certainty.     If myocardial infarction is still suspected,     repeat the test at appropriate intervals.  CK TOTAL AND CKMB     Status: None   Collection Time    01/08/14  9:38 AM      Result Value Ref Range   Total CK 135  7 - 232 U/L   CK, MB 2.5  0.3 - 4.0 ng/mL   Relative Index 1.9  0.0 - 2.5  PRO B NATRIURETIC PEPTIDE     Status: Abnormal   Collection Time    01/08/14  9:38 AM      Result Value Ref Range   Pro B Natriuretic peptide (BNP) 334.9 (*) 0 - 125 pg/mL  BLOOD GAS, ARTERIAL     Status: Abnormal   Collection Time    01/08/14  4:08 PM      Result Value Ref Range   O2 Content 4.0     Delivery systems NASAL CANNULA     pH, Arterial 7.436  7.350 - 7.450   pCO2 arterial 37.4  35.0 - 45.0 mmHg   pO2, Arterial 59.6 (*) 80.0 - 100.0 mmHg   Bicarbonate 24.7 (*) 20.0 - 24.0 mEq/L   TCO2 25.9  0 - 100 mmol/L   Acid-Base Excess 1.0  0.0 - 2.0 mmol/L   O2 Saturation 93.0     Patient temperature 98.6     Collection site RIGHT RADIAL     Drawn by [id number]     Sample type ARTERIAL DRAW     Allens test (pass/fail) PASS  PASS  TROPONIN I     Status: None   Collection Time    01/08/14  4:10 PM      Result Value Ref Range   Troponin I <0.30  <0.30 ng/mL   Comment:            Due to the release kinetics of cTnI,     a negative result within the first hours     of the onset of symptoms does not rule out     myocardial infarction with certainty.     If myocardial infarction is still suspected,     repeat the test at appropriate intervals.  COMPREHENSIVE METABOLIC PANEL     Status: Abnormal   Collection Time    01/08/14  4:15 PM      Result Value Ref Range   Sodium 134 (*) 137 - 147 mEq/L   Potassium 3.8  3.7 - 5.3 mEq/L   Chloride 95 (*) 96 - 112 mEq/L   CO2 22  19 - 32 mEq/L   Glucose, Bld 131 (*) 70 - 99 mg/dL   BUN 25 (*) 6 - 23 mg/dL   Creatinine, Ser 0.92  0.50 - 1.35 mg/dL   Calcium 8.4  8.4  - 10.5 mg/dL   Total Protein 6.9  6.0 - 8.3 g/dL   Albumin 2.0 (*) 3.5 - 5.2 g/dL   AST 49 (*) 0 - 37 U/L   ALT 29  0 - 53 U/L   Alkaline Phosphatase 59  39 - 117 U/L   Total Bilirubin 1.2  0.3 - 1.2 mg/dL   GFR calc non Af Amer >90  >90 mL/min   GFR calc Af Amer >90  >90 mL/min   Comment: (NOTE)     The eGFR has been calculated using the CKD EPI equation.     This calculation has not been validated in all clinical situations.     eGFR's persistently <90 mL/min signify possible Chronic Kidney     Disease.   Anion gap 17 (*) 5 - 15  CBC     Status: Abnormal   Collection Time    01/08/14  4:15 PM      Result Value Ref Range   WBC 18.5 (*) 4.0 - 10.5 K/uL   RBC 4.80  4.22 - 5.81 MIL/uL   Hemoglobin 13.3  13.0 - 17.0 g/dL   HCT 38.1 (*) 39.0 - 52.0 %   MCV 79.4  78.0 - 100.0 fL   MCH 27.7  26.0 - 34.0 pg   MCHC 34.9  30.0 - 36.0 g/dL   RDW 13.7  11.5 - 15.5 %   Platelets 188  150 - 400 K/uL   Comment: DELTA CHECK NOTED     REPEATED TO VERIFY  MAGNESIUM     Status: None   Collection Time    01/08/14  4:15 PM      Result Value Ref Range   Magnesium 2.2  1.5 - 2.5 mg/dL  PHOSPHORUS  Status: Abnormal   Collection Time    01/08/14  4:15 PM      Result Value Ref Range   Phosphorus 1.6 (*) 2.3 - 4.6 mg/dL  LACTIC ACID, PLASMA     Status: None   Collection Time    01/08/14  4:30 PM      Result Value Ref Range   Lactic Acid, Venous 1.9  0.5 - 2.2 mmol/L  MRSA PCR SCREENING     Status: None   Collection Time    01/08/14  5:23 PM      Result Value Ref Range   MRSA by PCR NEGATIVE  NEGATIVE   Comment:            The GeneXpert MRSA Assay (FDA     approved for NASAL specimens     only), is one component of a     comprehensive MRSA colonization     surveillance program. It is not     intended to diagnose MRSA     infection nor to guide or     monitor treatment for     MRSA infections.  POCT I-STAT 3, ART BLOOD GAS (G3+)     Status: Abnormal   Collection Time    01/08/14   6:25 PM      Result Value Ref Range   pH, Arterial 7.362  7.350 - 7.450   pCO2 arterial 49.1 (*) 35.0 - 45.0 mmHg   pO2, Arterial 208.0 (*) 80.0 - 100.0 mmHg   Bicarbonate 27.8 (*) 20.0 - 24.0 mEq/L   TCO2 29  0 - 100 mmol/L   O2 Saturation 100.0     Acid-Base Excess 2.0  0.0 - 2.0 mmol/L   Patient temperature 99.0 F     Sample type ARTERIAL    GLUCOSE, CAPILLARY     Status: Abnormal   Collection Time    01/08/14  6:33 PM      Result Value Ref Range   Glucose-Capillary 114 (*) 70 - 99 mg/dL  TROPONIN I     Status: None   Collection Time    01/08/14 10:40 PM      Result Value Ref Range   Troponin I <0.30  <0.30 ng/mL   Comment:            Due to the release kinetics of cTnI,     a negative result within the first hours     of the onset of symptoms does not rule out     myocardial infarction with certainty.     If myocardial infarction is still suspected,     repeat the test at appropriate intervals.  BLOOD GAS, ARTERIAL     Status: Abnormal   Collection Time    01/09/14  3:50 AM      Result Value Ref Range   FIO2 0.40     Delivery systems VENTILATOR     Mode PRESSURE REGULATED VOLUME CONTROL     VT 440     Rate 18     Peep/cpap 5.0     pH, Arterial 7.377  7.350 - 7.450   pCO2 arterial 49.8 (*) 35.0 - 45.0 mmHg   pO2, Arterial 106.0 (*) 80.0 - 100.0 mmHg   Bicarbonate 28.3 (*) 20.0 - 24.0 mEq/L   TCO2 29.7  0 - 100 mmol/L   Acid-Base Excess 3.7 (*) 0.0 - 2.0 mmol/L   O2 Saturation 98.2     Patient temperature 100.1     Collection site RIGHT  RADIAL     Drawn by [phone]     Sample type ARTERIAL DRAW     Allens test (pass/fail) PASS  PASS  COMPREHENSIVE METABOLIC PANEL     Status: Abnormal   Collection Time    01/09/14  5:05 AM      Result Value Ref Range   Sodium 138  137 - 147 mEq/L   Potassium 3.7  3.7 - 5.3 mEq/L   Chloride 100  96 - 112 mEq/L   CO2 27  19 - 32 mEq/L   Glucose, Bld 98  70 - 99 mg/dL   BUN 25 (*) 6 - 23 mg/dL   Creatinine, Ser 1.09  0.50 - 1.35  mg/dL   Calcium 7.6 (*) 8.4 - 10.5 mg/dL   Total Protein 5.9 (*) 6.0 - 8.3 g/dL   Albumin 1.8 (*) 3.5 - 5.2 g/dL   AST 31  0 - 37 U/L   ALT 25  0 - 53 U/L   Alkaline Phosphatase 46  39 - 117 U/L   Total Bilirubin 1.3 (*) 0.3 - 1.2 mg/dL   GFR calc non Af Amer 81 (*) >90 mL/min   GFR calc Af Amer >90  >90 mL/min   Comment: (NOTE)     The eGFR has been calculated using the CKD EPI equation.     This calculation has not been validated in all clinical situations.     eGFR's persistently <90 mL/min signify possible Chronic Kidney     Disease.   Anion gap 11  5 - 15  CBC     Status: Abnormal   Collection Time    01/09/14  5:05 AM      Result Value Ref Range   WBC 15.1 (*) 4.0 - 10.5 K/uL   RBC 4.11 (*) 4.22 - 5.81 MIL/uL   Hemoglobin 11.2 (*) 13.0 - 17.0 g/dL   HCT 33.6 (*) 39.0 - 52.0 %   MCV 81.8  78.0 - 100.0 fL   MCH 27.3  26.0 - 34.0 pg   MCHC 33.3  30.0 - 36.0 g/dL   RDW 14.3  11.5 - 15.5 %   Platelets 193  150 - 400 K/uL  MAGNESIUM     Status: None   Collection Time    01/09/14  5:05 AM      Result Value Ref Range   Magnesium 2.5  1.5 - 2.5 mg/dL  PHOSPHORUS     Status: Abnormal   Collection Time    01/09/14  5:05 AM      Result Value Ref Range   Phosphorus 1.7 (*) 2.3 - 4.6 mg/dL  TROPONIN I     Status: None   Collection Time    01/09/14  5:05 AM      Result Value Ref Range   Troponin I <0.30  <0.30 ng/mL   Comment:            Due to the release kinetics of cTnI,     a negative result within the first hours     of the onset of symptoms does not rule out     myocardial infarction with certainty.     If myocardial infarction is still suspected,     repeat the test at appropriate intervals.    Imaging / Studies: Dg Chest 2 View  01/07/2014   CLINICAL DATA:  Postoperative fever.  Abdominal pain.  EXAM: CHEST  2 VIEW  COMPARISON:  CT abdomen and pelvis 01/05/2014.  FINDINGS: The heart  size is exaggerated by low lung volumes. Bibasilar airspace disease is present.  Bilateral pleural effusions are noted. Pulmonary vascular congestion is evident. Obstructed bowel is evident.  IMPRESSION: 1. Bibasilar airspace disease with air bronchograms concerning for bilateral pneumonia or aspiration. 2. Bilateral pleural effusions and pulmonary vascular congestion.   Electronically Signed   By: Lawrence Santiago M.D.   On: 01/07/2014 21:04   Dg Chest Port 1 View  01/09/2014   CLINICAL DATA:  Ventilator dependence.  EXAM: PORTABLE CHEST - 1 VIEW  COMPARISON:  01/08/2014  FINDINGS: 0512 hrs. Lung volumes are low. Endotracheal tube tip is approximately 2.0 cm above the base of the chronic. A left IJ central line remains in place with tip position in the upper to mid right atrium. NG tube is looped in the stomach with the tip overlying the fundus. Pulmonary vascular congestion persists with basilar atelectasis and small bilateral pleural effusions. Gaseous dilatation of small bowel in the left abdomen does not appear substantially changed in the interval.  IMPRESSION: Low volume film with vascular congestion and bibasilar atelectasis. Small bilateral pleural effusions persist.  Gaseous small bowel dilatation in the visualized left upper abdomen.   Electronically Signed   By: Misty Stanley M.D.   On: 01/09/2014 07:02   Dg Chest Port 1 View  01/08/2014   CLINICAL DATA:  Intubation.  EXAM: PORTABLE CHEST - 1 VIEW  COMPARISON:  01/08/2014.  FINDINGS: Endotracheal tube noted 2.9 cm above the carina. Left IJ line noted with tip in right atrium. NG tube noted coiled in stomach. Cardiomegaly with mild pulmonary vascular prominence. Poor inspiration with prominent bibasilar atelectasis. Small pleural effusions. No pneumothorax. No acute bony abnormality. Distended loops of bowel are noted.  IMPRESSION: 1. Endotracheal tube noted with its tip 2.9 cm above the carina. Left IJ line noted with its tip in the right atrium. NG tube noted coiled in stomach. 2. Cardiomegaly with mild pulmonary vascular  prominence. 3. Poor inspiration with prominent bibasilar atelectasis. Small pleural effusions. 4. Bowel distention noted. Abdominal series can be obtained to further evaluate as needed.   Electronically Signed   By: Marcello Moores  Register   On: 01/08/2014 18:10   Dg Chest Port 1 View  01/08/2014   CLINICAL DATA:  Shortness of breath. Productive cough, hypertension.  EXAM: PORTABLE CHEST - 1 VIEW  COMPARISON:  01/07/2014  FINDINGS: Low lung volumes with bibasilar opacities, stable. Mild cardiomegaly. Vascular congestion. Possible small bilateral effusions. No real change since prior study.  IMPRESSION: Low lung volumes with continued bibasilar opacities. Cannot exclude pneumonia. Small bilateral effusions. No change.   Electronically Signed   By: Rolm Baptise M.D.   On: 01/08/2014 16:31   Dg Abd 2 Views  01/07/2014   CLINICAL DATA:  Distended bowel.  EXAM: ABDOMEN - 2 VIEW  COMPARISON:  01/05/2014 CT  FINDINGS: Gaseous distention of small bowel loops as well as the transverse colon and stomach. Contrast within decompressed descending colon and sigmoid colon. No free air organomegaly. Surgical drain in the right lower quadrant. No acute bony abnormality.  IMPRESSION: Nonspecific bowel gas pattern with gaseous distention of the stomach, small bowel and a transverse colon. Favor ileus. Small bowel dilatation has increased since prior study.   Electronically Signed   By: Rolm Baptise M.D.   On: 01/07/2014 21:13   Dg Abd Portable 1v  01/08/2014   CLINICAL DATA:  Generalized abdominal pain.  EXAM: PORTABLE ABDOMEN - 1 VIEW  COMPARISON:  01/07/2014  FINDINGS: Right lower  quadrant drain remains in place. Continued gaseous distention of bowel, similar to prior study. This is predominately stomach and small bowel. No change since prior study. No free air.  IMPRESSION: Stable gaseous distention of stomach and small bowel. This is presumably ileus although small bowel obstruction cannot be completely excluded.    Electronically Signed   By: Rolm Baptise M.D.   On: 01/08/2014 16:46    Medications / Allergies:  Scheduled Meds: . albuterol  2.5 mg Nebulization Q4H  . antiseptic oral rinse  7 mL Mouth Rinse QID  . bisacodyl  10 mg Rectal BID  . chlorhexidine  15 mL Mouth Rinse BID  . heparin subcutaneous  5,000 Units Subcutaneous 3 times per day  . lisinopril  10 mg Oral Daily  . metoprolol tartrate  12.5 mg Per Tube BID  . pantoprazole (PROTONIX) IV  40 mg Intravenous QHS  . piperacillin-tazobactam (ZOSYN)  IV  3.375 g Intravenous 3 times per day  . sodium phosphate  Dextrose 5% IVPB  20 mmol Intravenous Once  . vancomycin  1,000 mg Intravenous Q12H   Continuous Infusions: . dextrose 5 % and 0.45 % NaCl with KCl 20 mEq/L 10 mL/hr at 01/08/14 0940  . dextrose 5 % and 0.9% NaCl 75 mL/hr at 01/08/14 1841  . fentaNYL infusion INTRAVENOUS 400 mcg/hr (01/09/14 0458)   PRN Meds:.acetaminophen (TYLENOL) oral liquid 160 mg/5 mL, acetaminophen, acetaminophen, diphenhydrAMINE, diphenhydrAMINE, hydrALAZINE, HYDROmorphone (DILAUDID) injection, metoprolol, midazolam, ondansetron (ZOFRAN) IV  Antibiotics: Anti-infectives   Start     Dose/Rate Route Frequency Ordered Stop   01/08/14 2200  vancomycin (VANCOCIN) IVPB 1000 mg/200 mL premix     1,000 mg 200 mL/hr over 60 Minutes Intravenous Every 12 hours 01/08/14 0913     01/08/14 0945  vancomycin (VANCOCIN) 2,000 mg in sodium chloride 0.9 % 500 mL IVPB     2,000 mg 250 mL/hr over 120 Minutes Intravenous  Once 01/08/14 0913 01/08/14 1206   01/05/14 2200  piperacillin-tazobactam (ZOSYN) IVPB 3.375 g     3.375 g 12.5 mL/hr over 240 Minutes Intravenous 3 times per day 01/05/14 1847     01/05/14 1345  piperacillin-tazobactam (ZOSYN) IVPB 3.375 g     3.375 g 100 mL/hr over 30 Minutes Intravenous  Once 01/05/14 1343 01/05/14 1445        Assessment/Plan Gangrenous Ruptured Appendicitis---Dr. Hulen Skains 01/05/14  POD#4  -continue with IV antibiotics  ileus - NGT  to LWIS -Zosyn  -SCD/lovenox  -continue foley for I&O -drain care  -cultures-multiple organisms present   -CT of abdomen today to evaluate from cause of fevers, although I suspect its respiratory related -Start TPN VDRF -appreciate CCM management -Vanz/zosyn HTN hydralazine PRN Fever/leukocytosis -WBC trending down, pulmonary versus intra-abdominal source, CT today. -BC's x2 pending AKI-stable  Erby Pian, ANP-BC [facility] Pager [phone]) For consults and floor pages call [phone]([id number]:30P)  01/09/2014 7:41 AM

## 2014-01-09 NOTE — Progress Notes (Signed)
UR Completed.  Bodi Palmeri Jane [phone] 01/09/2014  

## 2014-01-09 NOTE — Progress Notes (Signed)
Patient transported to CT and back to [id number] without event. RN x2 at bedside. No complications. Vital signs stable at this time. RT will continue to monitor.

## 2014-01-09 NOTE — Progress Notes (Signed)
PULMONARY / CRITICAL CARE MEDICINE  Name: Jesus Hunter MRN: [id number] DOB: 1970/11/28    ADMISSION DATE:  01/05/2014 CONSULTATION DATE:  01/08/2014  REFERRING MD :  Catha Gosselin  CHIEF COMPLAINT:  Respiratory distress  INITIAL PRESENTATION: 43 yo presented 8/27 with ruptured appendix, taken to surgery for laparoscopic appendectomy and drainage of intra-abd abscess. On 8/30 developed sepsis and respiratory distress requiring intubation.  STUDIES / EVENTS:  8/27  Abdomen CT >>>  Ruptured appendix  8/30  Sepsis / acute respiratory failure >>> intubated  INTERVAL HISTORY:  No acute events overnight.  Plans to go to CT this AM.  VITAL SIGNS: Temp:  [98.2 F (36.8 C)-100.9 F (38.3 C)] 100.9 F (38.3 C) (08/31 0850) Pulse Rate:  [96-132] 132 (08/31 0802) Resp:  [11-30] 25 (08/31 0802) BP: (105-185)/(61-111) 168/106 mmHg (08/31 0802) SpO2:  [93 %-100 %] 94 % (08/31 0802) FiO2 (%):  [40 %-100 %] 40 % (08/31 0804) Weight:  [76.6 kg (168 lb 14 oz)] 76.6 kg (168 lb 14 oz) (08/31 0500)  HEMODYNAMICS:   VENTILATOR SETTINGS: Vent Mode:  [-] PRVC FiO2 (%):  [40 %-100 %] 40 % Set Rate:  [18 bmp] 18 bmp Vt Set:  [440 mL] 440 mL PEEP:  [5 cmH20] 5 cmH20 Plateau Pressure:  [20 cmH20-22 cmH20] 20 cmH20  INTAKE / OUTPUT: Intake/Output     08/30 0701 - 08/31 0700 08/31 0701 - 09/01 0700   P.O. 360    I.V. (mL/kg) 1414 (18.5) 897.3 (11.7)   NG/GT 375 375   IV Piggyback 350 400   Total Intake(mL/kg) 2499 (32.6) 1672.3 (21.8)   Urine (mL/kg/hr) 2775 (1.5) 600 (0.9)   Emesis/NG output 920 (0.5)    Drains 150 (0.1)    Total Output 3845 600   Net -1346 +1072.3        Stool Occurrence 1 x     PHYSICAL EXAMINATION: General:  Mechanically ventilated, synchronous Neuro:  Well sedated, non focal HEENT:  ETT Cardiovascular:  Regular, tachycardic Lungs:  Bilateral air entry, few rales Abdomen:  Surgical dressing d/c/i Musculoskeletal:  Intact  Skin:  Intact   LABS:  CBC  Recent Labs Lab  01/07/14 0524 01/08/14 1615 01/09/14 0505  WBC 11.3* 18.5* 15.1*  HGB 12.6* 13.3 11.2*  HCT 36.4* 38.1* 33.6*  PLT 117* 188 193   Coag's No results found for this basename: APTT, INR,  in the last 168 hours  BMET  Recent Labs Lab 01/07/14 0524 01/08/14 1615 01/09/14 0505  NA 133* 134* 138  K 4.2 3.8 3.7  CL 97 95* 100  CO2 BUN 22 25* 25*  CREATININE 1.28 0.92 1.09  GLUCOSE 134* 131* 98   Electrolytes  Recent Labs Lab 01/07/14 0524 01/08/14 1615 01/09/14 0505  CALCIUM 7.6* 8.4 7.6*  MG  --  2.2 2.5  PHOS  --  1.6* 1.7*   Sepsis Markers  Recent Labs Lab 01/08/14 1630  LATICACIDVEN 1.9   ABG  Recent Labs Lab 01/08/14 1608 01/08/14 1825 01/09/14 0350  PHART 7.436 7.362 7.377  PCO2ART 37.4 49.1* 49.8*  PO2ART 59.6* 208.0* 106.0*   Liver Enzymes  Recent Labs Lab 01/05/14 1228 01/08/14 1615 01/09/14 0505  AST 19 49* 31  ALT ALKPHOS 40 59 46  BILITOT 1.5* 1.2 1.3*  ALBUMIN 3.1* 2.0* 1.8*   Cardiac Enzymes  Recent Labs Lab 01/08/14 0938 01/08/14 1610 01/08/14 2240 01/09/14 0505  TROPONINI <0.30 <0.30 <0.30 <0.30  PROBNP 334.9*  --   --   --  Glucose  Recent Labs Lab 01/08/14 1833  GLUCAP 114*   IMAGING:   Dg Chest Port 1 View  01/08/2014   CLINICAL DATA:  Intubation.  EXAM: PORTABLE CHEST - 1 VIEW  COMPARISON:  01/08/2014.  FINDINGS: Endotracheal tube noted 2.9 cm above the carina. Left IJ line noted with tip in right atrium. NG tube noted coiled in stomach. Cardiomegaly with mild pulmonary vascular prominence. Poor inspiration with prominent bibasilar atelectasis. Small pleural effusions. No pneumothorax. No acute bony abnormality. Distended loops of bowel are noted.  IMPRESSION: 1. Endotracheal tube noted with its tip 2.9 cm above the carina. Left IJ line noted with its tip in the right atrium. NG tube noted coiled in stomach. 2. Cardiomegaly with mild pulmonary vascular prominence. 3. Poor inspiration with  prominent bibasilar atelectasis. Small pleural effusions. 4. Bowel distention noted. Abdominal series can be obtained to further evaluate as needed.   Electronically Signed   By: Maisie Fus  Register   On: 01/08/2014 18:10   Dg Chest Port 1 View  01/08/2014   CLINICAL DATA:  Shortness of breath. Productive cough, hypertension.  EXAM: PORTABLE CHEST - 1 VIEW  COMPARISON:  01/07/2014  FINDINGS: Low lung volumes with bibasilar opacities, stable. Mild cardiomegaly. Vascular congestion. Possible small bilateral effusions. No real change since prior study.  IMPRESSION: Low lung volumes with continued bibasilar opacities. Cannot exclude pneumonia. Small bilateral effusions. No change.   Electronically Signed   By: Charlett Nose M.D.   On: 01/08/2014 16:31   Dg Abd Portable 1v  01/08/2014   CLINICAL DATA:  Generalized abdominal pain.  EXAM: PORTABLE ABDOMEN - 1 VIEW  COMPARISON:  01/07/2014  FINDINGS: Right lower quadrant drain remains in place. Continued gaseous distention of bowel, similar to prior study. This is predominately stomach and small bowel. No change since prior study. No free air.  IMPRESSION: Stable gaseous distention of stomach and small bowel. This is presumably ileus although small bowel obstruction cannot be completely excluded.   Electronically Signed   By: Charlett Nose M.D.   On: 01/08/2014 16:46   ASSESSMENT / PLAN:  PULMONARY A:  Acute hypoxemic respiratory failure P:   Goal pH>7.30, SpO2>92 Continuous mechanical support VAP bundle Daily SBT, will attempt today after back from CT Trend ABG/CXR  CA HTN P:  Goal BP < 160/90 D/c Lisinopril / Metoprolol oral Hydralazine PRN  RENAL A:   Hypophosphatemia Hypokalemia  P:   IVF D5 NS @ 100 K 10 x 4 Na Phos 20  GASTROINTESTINAL A:   Ruptured appendicitis Ileus GI Px Nutrition P:   NPO NGT to Sx Abdomen CT per Surgery TPN per pharmacy Protonix   HEMATOLOGIC A:   VTE Px P:  Heparin Pierson  INFECTIOUS A:    Appendicitis, peritonitis, abscess s/p appendectomy and drainage 8/27 Abscess culture 8/27 >>> polymicrobial P:   Zosyn 8/27 >>> Vancomycin 8/30 >>> Follow blood cx 8/30 >>> Consider antifungals if no improvement  ENDOCRINE A: At risk for hyperglycemia with TNA   P:   Start SSI  NEUROLOGIC A:   Pain / sedation P:   RASS goal: 0 to -1 Start Precedex Titrate Fentanyl gtt to off after CT Fentanyl / Versed PRN  I have personally obtained history, examined patient, evaluated and interpreted laboratory and imaging results, reviewed medical records, formulated assessment / plan and placed orders.  CRITICAL CARE:  The patient is critically ill with multiple organ systems failure and requires high complexity decision making for assessment and support, frequent evaluation  and titration of therapies, application of advanced monitoring technologies and extensive interpretation of multiple databases. Critical Care Time devoted to patient care services described in this note is 35 minutes.   Lonia Farber, MD Pulmonary and Critical Care Medicine [facility] Pager: [phone]  01/09/2014, 9:50 AM

## 2014-01-09 NOTE — Progress Notes (Signed)
INITIAL NUTRITION ASSESSMENT  DOCUMENTATION CODES Per approved criteria  -Obesity Unspecified   INTERVENTION: TPN per Pharmacy to meet 100% of estimated nutrition needs as able.  NUTRITION DIAGNOSIS: Inadequate oral intake related to inability to eat with altered GI function as evidenced by NPO status.   Goal: Intake to meet >90% of estimated nutrition needs.  Monitor:  TPN tolerance/adequacy, weight trend, labs, vent status.  Reason for Assessment: Consult for new TPN  43 y.o. male  Admitting Dx: Gangrenous ruptured appendicitis; S/P laparoscopic appendectomy with drainage of abscess on 8/27  ASSESSMENT: 43 year old [name] who presented with 4 days of RLQ abdominal pain. CT of abdomen and pelvis showed ruptured appendicitis and possible abscess. S/P lap appendectomy with drainage of intra-abdominal abscess.   NGT in place to LIWS. S/P CT abdomen this AM. Suspected need for prolonged bowel rest with altered GI function; TPN being initiated today via PICC.  Discussed patient in ICU rounds today. Plans to extubate this afternoon. Nutrition focused physical exam completed.  No muscle or subcutaneous fat depletion noticed.  Patient is currently intubated on ventilator support MV: 8.4 L/min Temp (24hrs), Avg:99.2 F (37.3 C), Min:98.2 F (36.8 C), Max:100.9 F (38.3 C)   Height: Ht Readings from Last 1 Encounters:  01/08/14  (1.575 m)    Weight: Wt Readings from Last 1 Encounters:  01/09/14 168 lb 14 oz (76.6 kg)    Ideal Body Weight: 53.6 kg  % Ideal Body Weight: 143%  Wt Readings from Last 10 Encounters:  01/09/14 168 lb 14 oz (76.6 kg)  01/09/14 168 lb 14 oz (76.6 kg)    Usual Body Weight: unknown  % Usual Body Weight: N/A  BMI:  Body mass index is 30.88 kg/(m^2). class 1 obesity  Estimated Nutritional Needs: Kcal: 2000-2100 Protein: 100-115 gm Fluid: >/= 2 L  Skin: surgical incision to abdomen  Diet Order: NPO  EDUCATION  NEEDS: -Education not appropriate at this time   Intake/Output Summary (Last 24 hours) at 01/09/14 1130 Last data filed at 01/09/14 1000  Gross per 24 hour  Intake   2679 ml  Output   4035 ml  Net  -1356 ml    Last BM: 8/30   Labs:   Recent Labs Lab 01/07/14 0524 01/08/14 1615 01/09/14 0505  NA 133* 134* 138  K 4.2 3.8 3.7  CL 97 95* 100  CO2 BUN 22 25* 25*  CREATININE 1.28 0.92 1.09  CALCIUM 7.6* 8.4 7.6*  MG  --  2.2 2.5  PHOS  --  1.6* 1.7*  GLUCOSE 134* 131* 98    CBG (last 3)   Recent Labs  01/08/14 1833  GLUCAP 114*    Scheduled Meds: . antiseptic oral rinse  7 mL Mouth Rinse QID  . chlorhexidine  15 mL Mouth Rinse BID  . heparin subcutaneous  5,000 Units Subcutaneous 3 times per day  . pantoprazole (PROTONIX) IV  40 mg Intravenous QHS  . piperacillin-tazobactam (ZOSYN)  IV  3.375 g Intravenous 3 times per day  . potassium chloride  10 mEq Intravenous Q1 Hr x 4  . sodium phosphate  Dextrose 5% IVPB  20 mmol Intravenous Once  . vancomycin  1,000 mg Intravenous Q12H    Continuous Infusions: . Marland KitchenTPN (CLINIMIX-E) Adult     And  . fat emulsion    . dexmedetomidine 1 mcg/kg/hr (01/09/14 1016)  . dextrose 5 % and 0.9% NaCl 75 mL/hr at 01/08/14 1841  . fentaNYL  infusion INTRAVENOUS 400 mcg/hr (01/09/14 1610)    Past Medical History  Diagnosis Date  . Hypertension   . High cholesterol     Past Surgical History  Procedure Laterality Date  . Appendectomy  01/05/2014    ruptured/notes 01/05/2014  . Laparoscopic appendectomy N/A 01/05/2014    Procedure: APPENDECTOMY LAPAROSCOPIC;  Surgeon: [name], MD;  Location: [facility] OR;  Service: General;  Laterality: N/A;    [name], RD, LDN, CNSC Pager [phone] After Hours Pager [phone]

## 2014-01-10 ENCOUNTER — Inpatient Hospital Stay (HOSPITAL_COMMUNITY): Payer: BC Managed Care – PPO

## 2014-01-10 LAB — GLUCOSE, CAPILLARY
GLUCOSE-CAPILLARY: 110 mg/dL — AB (ref 70–99)
GLUCOSE-CAPILLARY: 147 mg/dL — AB (ref 70–99)
Glucose-Capillary: 119 mg/dL — ABNORMAL HIGH (ref 70–99)
Glucose-Capillary: 124 mg/dL — ABNORMAL HIGH (ref 70–99)

## 2014-01-10 LAB — CBC
HCT: 32.1 % — ABNORMAL LOW (ref 39.0–52.0)
HEMOGLOBIN: 10.8 g/dL — AB (ref 13.0–17.0)
MCH: 27.3 pg (ref 26.0–34.0)
MCHC: 33.6 g/dL (ref 30.0–36.0)
MCV: 81.3 fL (ref 78.0–100.0)
Platelets: 240 10*3/uL (ref 150–400)
RBC: 3.95 MIL/uL — ABNORMAL LOW (ref 4.22–5.81)
RDW: 14.3 % (ref 11.5–15.5)
WBC: 14.5 10*3/uL — ABNORMAL HIGH (ref 4.0–10.5)

## 2014-01-10 LAB — COMPREHENSIVE METABOLIC PANEL
ALBUMIN: 1.5 g/dL — AB (ref 3.5–5.2)
ALT: 30 U/L (ref 0–53)
AST: 39 U/L — AB (ref 0–37)
Alkaline Phosphatase: 58 U/L (ref 39–117)
Anion gap: 9 (ref 5–15)
BUN: 24 mg/dL — ABNORMAL HIGH (ref 6–23)
CO2: 26 meq/L (ref 19–32)
CREATININE: 0.98 mg/dL (ref 0.50–1.35)
Calcium: 7.1 mg/dL — ABNORMAL LOW (ref 8.4–10.5)
Chloride: 102 mEq/L (ref 96–112)
GFR calc Af Amer: >90 mL/min (ref >90)
Glucose, Bld: 111 mg/dL — ABNORMAL HIGH (ref 70–99)
Potassium: 3.9 mEq/L (ref 3.7–5.3)
Sodium: 137 mEq/L (ref 137–147)
Total Bilirubin: 1.9 mg/dL — ABNORMAL HIGH (ref 0.3–1.2)
Total Protein: 5.4 g/dL — ABNORMAL LOW (ref 6.0–8.3)

## 2014-01-10 LAB — ANAEROBIC CULTURE: Gram Stain: NONE SEEN

## 2014-01-10 LAB — MAGNESIUM: MAGNESIUM: 2.3 mg/dL (ref 1.5–2.5)

## 2014-01-10 LAB — PREALBUMIN: Prealbumin: 8.1 mg/dL — ABNORMAL LOW (ref 17.0–34.0)

## 2014-01-10 LAB — TRIGLYCERIDES: TRIGLYCERIDES: 235 mg/dL — AB (ref <150)

## 2014-01-10 LAB — PHOSPHORUS: PHOSPHORUS: 2.4 mg/dL (ref 2.3–4.6)

## 2014-01-10 MED ORDER — FAT EMULSION 20 % IV EMUL
250.0000 mL | INTRAVENOUS | Status: AC
  Administered 2014-01-10: 250 mL via INTRAVENOUS
  Filled 2014-01-10: qty 250

## 2014-01-10 MED ORDER — HYDROMORPHONE HCL PF 1 MG/ML IJ SOLN
1.0000 mg | INTRAMUSCULAR | Status: DC | PRN
  Administered 2014-01-10 – 2014-01-20 (×36): 1 mg via INTRAVENOUS
  Filled 2014-01-10 (×37): qty 1

## 2014-01-10 MED ORDER — TRACE MINERALS CR-CU-F-FE-I-MN-MO-SE-ZN IV SOLN
INTRAVENOUS | Status: AC
  Administered 2014-01-10: 18:00:00 via INTRAVENOUS
  Filled 2014-01-10: qty 2000

## 2014-01-10 MED ORDER — HYDROMORPHONE HCL PF 1 MG/ML IJ SOLN
0.5000 mg | INTRAMUSCULAR | Status: DC | PRN
  Administered 2014-01-10 (×2): 0.5 mg via INTRAVENOUS
  Filled 2014-01-10 (×2): qty 1

## 2014-01-10 MED ORDER — FENTANYL 50 MCG/HR TD PT72
50.0000 ug | MEDICATED_PATCH | TRANSDERMAL | Status: DC
  Administered 2014-01-10: 50 ug via TRANSDERMAL
  Filled 2014-01-10: qty 1

## 2014-01-10 MED ORDER — HYDROMORPHONE HCL PF 1 MG/ML IJ SOLN: 0.5000 mg | INTRAMUSCULAR | Status: DC | PRN

## 2014-01-10 MED ORDER — SODIUM CHLORIDE 0.9 % IV SOLN
100.0000 mg | Freq: Every day | INTRAVENOUS | Status: DC
  Administered 2014-01-10 – 2014-01-25 (×16): 100 mg via INTRAVENOUS
  Filled 2014-01-10 (×18): qty 100

## 2014-01-10 MED ORDER — SODIUM CHLORIDE 0.9 % IV SOLN
INTRAVENOUS | Status: DC
  Administered 2014-01-10: 10 mL/h via INTRAVENOUS
  Administered 2014-01-17 – 2014-01-25 (×4): via INTRAVENOUS

## 2014-01-10 MED ORDER — FENTANYL CITRATE 0.05 MG/ML IJ SOLN
25.0000 ug/h | INTRAMUSCULAR | Status: DC
  Administered 2014-01-10: 200 ug/h via INTRAVENOUS
  Filled 2014-01-10: qty 50

## 2014-01-10 NOTE — Progress Notes (Signed)
ABDOMEN OK.  LOOKS PULMONARY ON ABX AND VENT SUPPORT.

## 2014-01-10 NOTE — Progress Notes (Signed)
225cc Fentanyl drip wasted in sink.  Witnessed by Diona Fanti, RN

## 2014-01-10 NOTE — Progress Notes (Addendum)
PARENTERAL NUTRITION CONSULT NOTE  Pharmacy Consult for TPN Indication: prolonged ileus  No Known Allergies  Patient Measurements: Height:  (157.5 cm) Weight: 176 lb 2.4 oz (79.9 kg) IBW/kg (Calculated) : 54.6 Adjusted Body Weight: 61.2 kg   Vital Signs: Temp: 100.8 F (38.2 C) (09/01 0433) Temp src: Oral (09/01 0433) BP: 113/75 mmHg (09/01 0600) Pulse Rate: 83 (09/01 0600) Intake/Output from previous day: 08/31 0701 - 09/01 0700 In: 3640.3 [I.V.:1749.3; NG/GT:375; IV Piggyback:737.5; TPN:718.5] Out: 1910 [Urine:1400; Emesis/NG output:320; Drains:190] Intake/Output from this shift:    Labs:  Recent Labs  01/08/14 1615 01/09/14 0505 01/10/14 0525  WBC 18.5* 15.1* 14.5*  HGB 13.3 11.2* 10.8*  HCT 38.1* 33.6* 32.1*  PLT 188 193 240     Recent Labs  01/08/14 1615 01/09/14 0505 01/10/14 0525  NA 134* 138 137  K 3.8 3.7 3.9  CL 95* 100 102  CO2 GLUCOSE 131* 98 111*  BUN 25* 25* 24*  CREATININE 0.92 1.09 0.98  CALCIUM 8.4 7.6* 7.1*  MG 2.2 2.5 2.3  PHOS 1.6* 1.7* 2.4  PROT 6.9 5.9* 5.4*  ALBUMIN 2.0* 1.8* 1.5*  AST 49* 31 39*  ALT ALKPHOS 59 46 58  BILITOT 1.2 1.3* 1.9*  TRIG  --   --  235*   Estimated Creatinine Clearance: 88 ml/min (by C-G formula based on Cr of 0.98).    Recent Labs  01/09/14 1528 01/09/14 1957 01/09/14 2347  GLUCAP 99 136* 119*    Insulin Requirements in the past 24 hours:  1 unit SSI  Current Nutrition:  NPO, TPN at 42 ml/hr + lipids at 10 ml/hr  Assessment: 43yo M s/p gangrenous ruptured appendicitis. POD 5.  Tolerated first day of TPN well.   GI: ileus, NGT to LWIS, 920 out NGT 8/30;  320 out 8/31.  Has IV PPI.   Lytes: K 3.9 after 4 runs yesterday, phos 2.4 after 20 mM na phos yest, mag 2.3   Hepaobil: Trig 235, pt w/ PMH of high cholesterol, T bili rising 1.2>1.3>1.9, OK to leave micronutrients in TPN for now  Nutritional Goals: est by RD 8/31:  Kcal: 2000-2100  Protein: 100-115 gm   Fluid: >/= 2 L  IV Access: CVC triple lumen LIJ placed 8/30  TPN day # 1   (8/31>>   )  Plan:  Increase Clinimix E 5/15 to goal rate of 83 ml/hr plus lipids at 10 mls/hr to provide 99.6 gm protein and 1894 kcals.  This provides 97% protein and 95% kcal goals.  continue empiric SSI q6h for now, will stop if continues excellent glycemic control  BMET, mag and phos in am  Herby Abraham, Pharm.D. [phone] 01/10/2014 8:13 AM

## 2014-01-10 NOTE — Progress Notes (Signed)
Patient ID: Jesus Hunter, male   DOB: 06-09-1970, 43 y.o.   MRN: [id number]     [address] [place], [place] [address]    Phone: [phone] FAX: [phone]     Subjective: Awake on vent.  On levophed.  Low grade temp.  No tachycardia.   Objective:  Vital signs:  Filed Vitals:   01/10/14 0700 01/10/14 0820 01/10/14 0843 01/10/14 0900  BP: 114/68 116/66  117/84  Pulse: 77 78  83  Temp:   100.1 F (37.8 C)   TempSrc:   Oral   Resp: $Remo'23 21  19  'tjmYj$ Height:      Weight:      SpO2: 100% 100%  99%    Last BM Date: 01/08/14  Intake/Output   Yesterday:  08/31 0701 - 09/01 0700 In: 3640.3 [I.V.:1749.3; NG/GT:375; IV Piggyback:737.5; TPN:718.5] Out: 1910 [Urine:1400; Emesis/NG output:320; Drains:190] This shift:  Total I/O In: 190 [I.V.:86; TPN:104] Out: 90 [Urine:90]  Physical Exam:  General: Pt awake on vent  Chest: cta. On vent. CV: Pulses intact. Regular rhythm.  Abdomen: Soft. distended. Mildly tender at incisions only. RLQ JP drain with serous output. No evidence of peritonitis. No incarcerated hernias.  Ext: SCDs BLE. No mjr edema. No cyanosis  Skin: No petechiae / purpura   Problem List:   Active Problems:   Acute perforated appendicitis   Acute respiratory failure   HTN (hypertension)   Sepsis    Results:   Labs: Results for orders placed during the hospital encounter of 01/05/14 (from the past 48 hour(s))  TROPONIN I     Status: None   Collection Time    01/08/14  9:38 AM      Result Value Ref Range   Troponin I <0.30  <0.30 ng/mL   Comment:            Due to the release kinetics of cTnI,     a negative result within the first hours     of the onset of symptoms does not rule out     myocardial infarction with certainty.     If myocardial infarction is still suspected,     repeat the test at appropriate intervals.  CK TOTAL AND CKMB     Status: None   Collection Time    01/08/14   9:38 AM      Result Value Ref Range   Total CK 135  7 - 232 U/L   CK, MB 2.5  0.3 - 4.0 ng/mL   Relative Index 1.9  0.0 - 2.5  PRO B NATRIURETIC PEPTIDE     Status: Abnormal   Collection Time    01/08/14  9:38 AM      Result Value Ref Range   Pro B Natriuretic peptide (BNP) 334.9 (*) 0 - 125 pg/mL  CULTURE, BLOOD (ROUTINE X 2)     Status: None   Collection Time    01/08/14 12:45 PM      Result Value Ref Range   Specimen Description BLOOD LEFT ARM     Special Requests BOTTLES DRAWN AEROBIC ONLY 5CC     Culture  Setup Time       Value: 01/08/2014 18:43     Performed at [facility]   Culture       Value:        BLOOD CULTURE RECEIVED NO GROWTH TO DATE CULTURE WILL BE HELD FOR 5  DAYS BEFORE ISSUING A FINAL NEGATIVE REPORT     Performed at [facility]   Report Status PENDING    CULTURE, BLOOD (ROUTINE X 2)     Status: None   Collection Time    01/08/14  1:00 PM      Result Value Ref Range   Specimen Description BLOOD LEFT HAND     Special Requests BOTTLES DRAWN AEROBIC ONLY 8CC     Culture  Setup Time       Value: 01/08/2014 18:44     Performed at [facility]   Culture       Value:        BLOOD CULTURE RECEIVED NO GROWTH TO DATE CULTURE WILL BE HELD FOR 5 DAYS BEFORE ISSUING A FINAL NEGATIVE REPORT     Performed at [facility]   Report Status PENDING    BLOOD GAS, ARTERIAL     Status: Abnormal   Collection Time    01/08/14  4:08 PM      Result Value Ref Range   O2 Content 4.0     Delivery systems NASAL CANNULA     pH, Arterial 7.436  7.350 - 7.450   pCO2 arterial 37.4  35.0 - 45.0 mmHg   pO2, Arterial 59.6 (*) 80.0 - 100.0 mmHg   Bicarbonate 24.7 (*) 20.0 - 24.0 mEq/L   TCO2 25.9  0 - 100 mmol/L   Acid-Base Excess 1.0  0.0 - 2.0 mmol/L   O2 Saturation 93.0     Patient temperature 98.6     Collection site RIGHT RADIAL     Drawn by [id number]     Sample type ARTERIAL DRAW     Allens test (pass/fail) PASS  PASS  TROPONIN I     Status: None    Collection Time    01/08/14  4:10 PM      Result Value Ref Range   Troponin I <0.30  <0.30 ng/mL   Comment:            Due to the release kinetics of cTnI,     a negative result within the first hours     of the onset of symptoms does not rule out     myocardial infarction with certainty.     If myocardial infarction is still suspected,     repeat the test at appropriate intervals.  COMPREHENSIVE METABOLIC PANEL     Status: Abnormal   Collection Time    01/08/14  4:15 PM      Result Value Ref Range   Sodium 134 (*) 137 - 147 mEq/L   Potassium 3.8  3.7 - 5.3 mEq/L   Chloride 95 (*) 96 - 112 mEq/L   CO2 22  19 - 32 mEq/L   Glucose, Bld 131 (*) 70 - 99 mg/dL   BUN 25 (*) 6 - 23 mg/dL   Creatinine, Ser 0.92  0.50 - 1.35 mg/dL   Calcium 8.4  8.4 - 10.5 mg/dL   Total Protein 6.9  6.0 - 8.3 g/dL   Albumin 2.0 (*) 3.5 - 5.2 g/dL   AST 49 (*) 0 - 37 U/L   ALT 29  0 - 53 U/L   Alkaline Phosphatase 59  39 - 117 U/L   Total Bilirubin 1.2  0.3 - 1.2 mg/dL   GFR calc non Af Amer >90  >90 mL/min   GFR calc Af Amer >90  >90 mL/min   Comment: (NOTE)  The eGFR has been calculated using the CKD EPI equation.     This calculation has not been validated in all clinical situations.     eGFR's persistently <90 mL/min signify possible Chronic Kidney     Disease.   Anion gap 17 (*) 5 - 15  CBC     Status: Abnormal   Collection Time    01/08/14  4:15 PM      Result Value Ref Range   WBC 18.5 (*) 4.0 - 10.5 K/uL   RBC 4.80  4.22 - 5.81 MIL/uL   Hemoglobin 13.3  13.0 - 17.0 g/dL   HCT 38.1 (*) 39.0 - 52.0 %   MCV 79.4  78.0 - 100.0 fL   MCH 27.7  26.0 - 34.0 pg   MCHC 34.9  30.0 - 36.0 g/dL   RDW 13.7  11.5 - 15.5 %   Platelets 188  150 - 400 K/uL   Comment: DELTA CHECK NOTED     REPEATED TO VERIFY  MAGNESIUM     Status: None   Collection Time    01/08/14  4:15 PM      Result Value Ref Range   Magnesium 2.2  1.5 - 2.5 mg/dL  PHOSPHORUS     Status: Abnormal   Collection Time     01/08/14  4:15 PM      Result Value Ref Range   Phosphorus 1.6 (*) 2.3 - 4.6 mg/dL  LACTIC ACID, PLASMA     Status: None   Collection Time    01/08/14  4:30 PM      Result Value Ref Range   Lactic Acid, Venous 1.9  0.5 - 2.2 mmol/L  MRSA PCR SCREENING     Status: None   Collection Time    01/08/14  5:23 PM      Result Value Ref Range   MRSA by PCR NEGATIVE  NEGATIVE   Comment:            The GeneXpert MRSA Assay (FDA     approved for NASAL specimens     only), is one component of a     comprehensive MRSA colonization     surveillance program. It is not     intended to diagnose MRSA     infection nor to guide or     monitor treatment for     MRSA infections.  POCT I-STAT 3, ART BLOOD GAS (G3+)     Status: Abnormal   Collection Time    01/08/14  6:25 PM      Result Value Ref Range   pH, Arterial 7.362  7.350 - 7.450   pCO2 arterial 49.1 (*) 35.0 - 45.0 mmHg   pO2, Arterial 208.0 (*) 80.0 - 100.0 mmHg   Bicarbonate 27.8 (*) 20.0 - 24.0 mEq/L   TCO2 29  0 - 100 mmol/L   O2 Saturation 100.0     Acid-Base Excess 2.0  0.0 - 2.0 mmol/L   Patient temperature 99.0 F     Sample type ARTERIAL    GLUCOSE, CAPILLARY     Status: Abnormal   Collection Time    01/08/14  6:33 PM      Result Value Ref Range   Glucose-Capillary 114 (*) 70 - 99 mg/dL  TROPONIN I     Status: None   Collection Time    01/08/14 10:40 PM      Result Value Ref Range   Troponin I <0.30  <0.30 ng/mL   Comment:  Due to the release kinetics of cTnI,     a negative result within the first hours     of the onset of symptoms does not rule out     myocardial infarction with certainty.     If myocardial infarction is still suspected,     repeat the test at appropriate intervals.  BLOOD GAS, ARTERIAL     Status: Abnormal   Collection Time    01/09/14  3:50 AM      Result Value Ref Range   FIO2 0.40     Delivery systems VENTILATOR     Mode PRESSURE REGULATED VOLUME CONTROL     VT 440     Rate 18      Peep/cpap 5.0     pH, Arterial 7.377  7.350 - 7.450   pCO2 arterial 49.8 (*) 35.0 - 45.0 mmHg   pO2, Arterial 106.0 (*) 80.0 - 100.0 mmHg   Bicarbonate 28.3 (*) 20.0 - 24.0 mEq/L   TCO2 29.7  0 - 100 mmol/L   Acid-Base Excess 3.7 (*) 0.0 - 2.0 mmol/L   O2 Saturation 98.2     Patient temperature 100.1     Collection site RIGHT RADIAL     Drawn by [id number]     Sample type ARTERIAL DRAW     Allens test (pass/fail) PASS  PASS  COMPREHENSIVE METABOLIC PANEL     Status: Abnormal   Collection Time    01/09/14  5:05 AM      Result Value Ref Range   Sodium 138  137 - 147 mEq/L   Potassium 3.7  3.7 - 5.3 mEq/L   Chloride 100  96 - 112 mEq/L   CO2 27  19 - 32 mEq/L   Glucose, Bld 98  70 - 99 mg/dL   BUN 25 (*) 6 - 23 mg/dL   Creatinine, Ser 1.09  0.50 - 1.35 mg/dL   Calcium 7.6 (*) 8.4 - 10.5 mg/dL   Total Protein 5.9 (*) 6.0 - 8.3 g/dL   Albumin 1.8 (*) 3.5 - 5.2 g/dL   AST 31  0 - 37 U/L   ALT 25  0 - 53 U/L   Alkaline Phosphatase 46  39 - 117 U/L   Total Bilirubin 1.3 (*) 0.3 - 1.2 mg/dL   GFR calc non Af Amer 81 (*) >90 mL/min   GFR calc Af Amer >90  >90 mL/min   Comment: (NOTE)     The eGFR has been calculated using the CKD EPI equation.     This calculation has not been validated in all clinical situations.     eGFR's persistently <90 mL/min signify possible Chronic Kidney     Disease.   Anion gap 11  5 - 15  CBC     Status: Abnormal   Collection Time    01/09/14  5:05 AM      Result Value Ref Range   WBC 15.1 (*) 4.0 - 10.5 K/uL   RBC 4.11 (*) 4.22 - 5.81 MIL/uL   Hemoglobin 11.2 (*) 13.0 - 17.0 g/dL   HCT 33.6 (*) 39.0 - 52.0 %   MCV 81.8  78.0 - 100.0 fL   MCH 27.3  26.0 - 34.0 pg   MCHC 33.3  30.0 - 36.0 g/dL   RDW 14.3  11.5 - 15.5 %   Platelets 193  150 - 400 K/uL  MAGNESIUM     Status: None   Collection Time    01/09/14  5:05 AM  Result Value Ref Range   Magnesium 2.5  1.5 - 2.5 mg/dL  PHOSPHORUS     Status: Abnormal   Collection Time    01/09/14  5:05  AM      Result Value Ref Range   Phosphorus 1.7 (*) 2.3 - 4.6 mg/dL  TROPONIN I     Status: None   Collection Time    01/09/14  5:05 AM      Result Value Ref Range   Troponin I <0.30  <0.30 ng/mL   Comment:            Due to the release kinetics of cTnI,     a negative result within the first hours     of the onset of symptoms does not rule out     myocardial infarction with certainty.     If myocardial infarction is still suspected,     repeat the test at appropriate intervals.  TROPONIN I     Status: None   Collection Time    01/09/14 10:00 AM      Result Value Ref Range   Troponin I <0.30  <0.30 ng/mL   Comment:            Due to the release kinetics of cTnI,     a negative result within the first hours     of the onset of symptoms does not rule out     myocardial infarction with certainty.     If myocardial infarction is still suspected,     repeat the test at appropriate intervals.  GLUCOSE, CAPILLARY     Status: Abnormal   Collection Time    01/09/14 12:15 PM      Result Value Ref Range   Glucose-Capillary 112 (*) 70 - 99 mg/dL  GLUCOSE, CAPILLARY     Status: None   Collection Time    01/09/14  3:28 PM      Result Value Ref Range   Glucose-Capillary 99  70 - 99 mg/dL  GLUCOSE, CAPILLARY     Status: Abnormal   Collection Time    01/09/14  7:57 PM      Result Value Ref Range   Glucose-Capillary 136 (*) 70 - 99 mg/dL  GLUCOSE, CAPILLARY     Status: Abnormal   Collection Time    01/09/14 11:47 PM      Result Value Ref Range   Glucose-Capillary 119 (*) 70 - 99 mg/dL  PHOSPHORUS     Status: None   Collection Time    01/10/14  5:25 AM      Result Value Ref Range   Phosphorus 2.4  2.3 - 4.6 mg/dL  COMPREHENSIVE METABOLIC PANEL     Status: Abnormal   Collection Time    01/10/14  5:25 AM      Result Value Ref Range   Sodium 137  137 - 147 mEq/L   Potassium 3.9  3.7 - 5.3 mEq/L   Chloride 102  96 - 112 mEq/L   CO2 26  19 - 32 mEq/L   Glucose, Bld 111 (*) 70 - 99  mg/dL   BUN 24 (*) 6 - 23 mg/dL   Creatinine, Ser 0.98  0.50 - 1.35 mg/dL   Calcium 7.1 (*) 8.4 - 10.5 mg/dL   Total Protein 5.4 (*) 6.0 - 8.3 g/dL   Albumin 1.5 (*) 3.5 - 5.2 g/dL   AST 39 (*) 0 - 37 U/L   ALT 30  0 - 53 U/L  Alkaline Phosphatase 58  39 - 117 U/L   Total Bilirubin 1.9 (*) 0.3 - 1.2 mg/dL   GFR calc non Af Amer >90  >90 mL/min   GFR calc Af Amer >90  >90 mL/min   Comment: (NOTE)     The eGFR has been calculated using the CKD EPI equation.     This calculation has not been validated in all clinical situations.     eGFR's persistently <90 mL/min signify possible Chronic Kidney     Disease.   Anion gap 9  5 - 15  MAGNESIUM     Status: None   Collection Time    01/10/14  5:25 AM      Result Value Ref Range   Magnesium 2.3  1.5 - 2.5 mg/dL  CBC     Status: Abnormal   Collection Time    01/10/14  5:25 AM      Result Value Ref Range   WBC 14.5 (*) 4.0 - 10.5 K/uL   RBC 3.95 (*) 4.22 - 5.81 MIL/uL   Hemoglobin 10.8 (*) 13.0 - 17.0 g/dL   HCT 53.9 (*) 76.7 - 34.1 %   MCV 81.3  78.0 - 100.0 fL   MCH 27.3  26.0 - 34.0 pg   MCHC 33.6  30.0 - 36.0 g/dL   RDW 93.7  90.2 - 40.9 %   Platelets 240  150 - 400 K/uL  TRIGLYCERIDES     Status: Abnormal   Collection Time    01/10/14  5:25 AM      Result Value Ref Range   Triglycerides 235 (*) <150 mg/dL  GLUCOSE, CAPILLARY     Status: Abnormal   Collection Time    01/10/14  6:21 AM      Result Value Ref Range   Glucose-Capillary 124 (*) 70 - 99 mg/dL   Comment 1 Documented in Chart     Comment 2 Notify RN      Imaging / Studies: Ct Abdomen Pelvis W Contrast  01/09/2014   CLINICAL DATA:  Ruptured appendicitis 3 days ago now with increasing abdominal distention.  EXAM: CT ABDOMEN AND PELVIS WITH CONTRAST  TECHNIQUE: Multidetector CT imaging of the abdomen and pelvis was performed using the standard protocol following bolus administration of intravenous contrast.  CONTRAST:  OMNIPAQUE IOHEXOL 300 MG/ML SOLN  intravenously ; the patient also received oral contrast material.  COMPARISON:  Abdominal pelvic CT scan of January 05, 2014  FINDINGS: Contrast is present in the stomach and proximal and mid small bowel. The small bowel loops are mildly distended with gas and the contrast. There is a loop of unopacified distal small bowel on images 61-82 position superior and medial to the drainage catheter. There is a small amount of increased density within the adjacent extraluminal soft tissues demonstrated on images 63 through 69. This likely reflects a suture line though a small amount of extraluminal contrast is not absolutely excluded. Small amounts of low density interloop fluid persists. There is no discrete drainable abscess.  There is contrast in the colon likely remaining from the previous CT scan. There is mild thickening of the colonic wall of the ascending colon and rectosigmoid without adjacent abscess formation.  The liver, gallbladder, pancreas, spleen, adrenal glands, and kidneys exhibit no acute abnormalities. There is a moderate amount of fluid along the inferior surface of the liver with a smaller amount in the paracolic gutter. The stomach contains a nasogastric tube. There is a small amount of free fluid  in the pelvis. The urinary bladder is partially distended and contains a Foley catheter. The lung bases exhibit parenchymal consolidation posteriorly consistent with pneumonia.  IMPRESSION: 1. There is a small bowel ileus. There remains interloop fluid as well as considerable fluid in the subhepatic region. A drainage catheter is in place in the right lower abdomen and upper pelvis. A small amount of radiodense material adjacent to the drainage catheter on images 64-66 likely reflects suture, but a tiny amount of extraluminal contrast is not excluded. No extraluminal contrast collections are demonstrated elsewhere. There is no no new discrete drainable abscess. 2. The small bowel gas pattern is consistent  with an ileus. There is a loop of thick-walled un-opacified distal small bowel in the right lower quadrant. 3. The gallbladder is mildly distended without evidence of stones. 4. There is bibasilar pneumonia.   Electronically Signed   By: David  Martinique   On: 01/09/2014 11:47   Dg Chest Port 1 View  01/10/2014   CLINICAL DATA:  Sepsis.  Respiratory distress.  EXAM: PORTABLE CHEST - 1 VIEW  COMPARISON:  Single view of the chest 01/09/2014 and 01/08/2014.  FINDINGS: Support tubes and lines are unchanged. Lung volumes are low with basilar airspace opacities, worse on the left. No pneumothorax is identified. Heart size is normal.  IMPRESSION: Left worse than right basilar airspace opacities are likely due to atelectasis in this low volume chest. The appearance is not markedly changed.   Electronically Signed   By: Inge Rise M.D.   On: 01/10/2014 07:30   Dg Chest Port 1 View  01/09/2014   CLINICAL DATA:  Ventilator dependence.  EXAM: PORTABLE CHEST - 1 VIEW  COMPARISON:  01/08/2014  FINDINGS: 0512 hrs. Lung volumes are low. Endotracheal tube tip is approximately 2.0 cm above the base of the chronic. A left IJ central line remains in place with tip position in the upper to mid right atrium. NG tube is looped in the stomach with the tip overlying the fundus. Pulmonary vascular congestion persists with basilar atelectasis and small bilateral pleural effusions. Gaseous dilatation of small bowel in the left abdomen does not appear substantially changed in the interval.  IMPRESSION: Low volume film with vascular congestion and bibasilar atelectasis. Small bilateral pleural effusions persist.  Gaseous small bowel dilatation in the visualized left upper abdomen.   Electronically Signed   By: Misty Stanley M.D.   On: 01/09/2014 07:02   Dg Chest Port 1 View  01/08/2014   CLINICAL DATA:  Intubation.  EXAM: PORTABLE CHEST - 1 VIEW  COMPARISON:  01/08/2014.  FINDINGS: Endotracheal tube noted 2.9 cm above the carina. Left  IJ line noted with tip in right atrium. NG tube noted coiled in stomach. Cardiomegaly with mild pulmonary vascular prominence. Poor inspiration with prominent bibasilar atelectasis. Small pleural effusions. No pneumothorax. No acute bony abnormality. Distended loops of bowel are noted.  IMPRESSION: 1. Endotracheal tube noted with its tip 2.9 cm above the carina. Left IJ line noted with its tip in the right atrium. NG tube noted coiled in stomach. 2. Cardiomegaly with mild pulmonary vascular prominence. 3. Poor inspiration with prominent bibasilar atelectasis. Small pleural effusions. 4. Bowel distention noted. Abdominal series can be obtained to further evaluate as needed.   Electronically Signed   By: Marcello Moores  Register   On: 01/08/2014 18:10   Dg Chest Port 1 View  01/08/2014   CLINICAL DATA:  Shortness of breath. Productive cough, hypertension.  EXAM: PORTABLE CHEST - 1 VIEW  COMPARISON:  01/07/2014  FINDINGS: Low lung volumes with bibasilar opacities, stable. Mild cardiomegaly. Vascular congestion. Possible small bilateral effusions. No real change since prior study.  IMPRESSION: Low lung volumes with continued bibasilar opacities. Cannot exclude pneumonia. Small bilateral effusions. No change.   Electronically Signed   By: Rolm Baptise M.D.   On: 01/08/2014 16:31   Dg Abd Portable 1v  01/08/2014   CLINICAL DATA:  Generalized abdominal pain.  EXAM: PORTABLE ABDOMEN - 1 VIEW  COMPARISON:  01/07/2014  FINDINGS: Right lower quadrant drain remains in place. Continued gaseous distention of bowel, similar to prior study. This is predominately stomach and small bowel. No change since prior study. No free air.  IMPRESSION: Stable gaseous distention of stomach and small bowel. This is presumably ileus although small bowel obstruction cannot be completely excluded.   Electronically Signed   By: Rolm Baptise M.D.   On: 01/08/2014 16:46    Medications / Allergies:  Scheduled Meds: . antiseptic oral rinse  7 mL  Mouth Rinse QID  . chlorhexidine  15 mL Mouth Rinse BID  . heparin subcutaneous  5,000 Units Subcutaneous 3 times per day  . insulin aspart  0-9 Units Subcutaneous 4 times per day  . pantoprazole (PROTONIX) IV  40 mg Intravenous QHS  . piperacillin-tazobactam (ZOSYN)  IV  3.375 g Intravenous 3 times per day  . vancomycin  1,000 mg Intravenous Q12H   Continuous Infusions: . Marland KitchenTPN (CLINIMIX-E) Adult 42 mL/hr at 01/09/14 1711   And  . fat emulsion 250 mL (01/09/14 1711)  . Marland KitchenTPN (CLINIMIX-E) Adult     And  . fat emulsion    . sodium chloride 10 mL/hr (01/10/14 0800)  . dexmedetomidine 1.2 mcg/kg/hr (01/10/14 0525)  . fentaNYL infusion INTRAVENOUS 200 mcg/hr (01/09/14 2141)  . norepinephrine (LEVOPHED) Adult infusion     PRN Meds:.acetaminophen (TYLENOL) oral liquid 160 mg/5 mL, acetaminophen, acetaminophen, albuterol, diphenhydrAMINE, diphenhydrAMINE, fentaNYL, hydrALAZINE, midazolam, ondansetron (ZOFRAN) IV  Antibiotics: Anti-infectives   Start     Dose/Rate Route Frequency Ordered Stop   01/08/14 2200  vancomycin (VANCOCIN) IVPB 1000 mg/200 mL premix     1,000 mg 200 mL/hr over 60 Minutes Intravenous Every 12 hours 01/08/14 0913     01/08/14 0945  vancomycin (VANCOCIN) 2,000 mg in sodium chloride 0.9 % 500 mL IVPB     2,000 mg 250 mL/hr over 120 Minutes Intravenous  Once 01/08/14 0913 01/08/14 1206   01/05/14 2200  piperacillin-tazobactam (ZOSYN) IVPB 3.375 g     3.375 g 12.5 mL/hr over 240 Minutes Intravenous 3 times per day 01/05/14 1847     01/05/14 1345  piperacillin-tazobactam (ZOSYN) IVPB 3.375 g     3.375 g 100 mL/hr over 30 Minutes Intravenous  Once 01/05/14 1343 01/05/14 1445        Assessment/Plan Gangrenous Ruptured Appendicitis---Dr. Hulen Skains 01/05/14  PCM POD#5  -continue with IV antibiotics  ileus - NGT to LWIS  -Zosyn  -SCD/lovenox  -continue foley for I&O  -drain care(more serous) -cultures-multiple organisms present  -CT of abdomen/pelvis negative for  abscess(8/31) -TPN  VDRF  -appreciate CCM management  -Vanz/zosyn for suspected PNA AKI-stable   Erby Pian, ANP-BC [facility] Pager [phone](7A-4:30P) For consults and floor pages call [phone](7A-4:30P)  01/10/2014 9:25 AM

## 2014-01-10 NOTE — Progress Notes (Signed)
PULMONARY / CRITICAL CARE MEDICINE  Name: Jesus Hunter MRN: [id number] DOB: 1971/01/29    ADMISSION DATE:  01/05/2014 CONSULTATION DATE:  01/08/2014  REFERRING MD :  Catha Gosselin  CHIEF COMPLAINT:  Respiratory distress  INITIAL PRESENTATION: 43 yo presented 8/27 with ruptured appendix, taken to surgery for laparoscopic appendectomy and drainage of intra-abd abscess. On 8/30 developed sepsis and respiratory distress requiring intubation.  STUDIES / EVENTS:  8/27  Abdomen CT >>>  Ruptured appendix  8/30  Sepsis / acute respiratory failure >>> intubated 8/31  Abdomen CT >>> Ileus, mildly distended gallbladder without stones, bibasal atelectasis   INTERVAL HISTORY:  Periods of agitation, tachypnea, diaphoresis relieved almost immediately by IV Fentanyl  VITAL SIGNS: Temp:  [98.8 F (37.1 C)-101.2 F (38.4 C)] 100.1 F (37.8 C) (09/01 0843) Pulse Rate:  [74-101] 83 (09/01 0900) Resp:  [15-27] 19 (09/01 0900) BP: (89-127)/(55-91) 117/84 mmHg (09/01 0900) SpO2:  [96 %-100 %] 99 % (09/01 0900) FiO2 (%):  [40 %] 40 % (09/01 0820) Weight:  [79.9 kg (176 lb 2.4 oz)] 79.9 kg (176 lb 2.4 oz) (09/01 0400)  HEMODYNAMICS:   VENTILATOR SETTINGS: Vent Mode:  [-] CPAP;PSV FiO2 (%):  [40 %] 40 % Set Rate:  [18 bmp] 18 bmp Vt Set:  [440 mL] 440 mL PEEP:  [5 cmH20] 5 cmH20 Pressure Support:  [5 cmH20] 5 cmH20 Plateau Pressure:  [16 cmH20-21 cmH20] 21 cmH20  INTAKE / OUTPUT: Intake/Output     08/31 0701 - 09/01 0700 09/01 0701 - 09/02 0700   P.O.     I.V. (mL/kg) 1749.3 (21.9) 129 (1.6)   Other 60    NG/GT 375    IV Piggyback 737.5 200   TPN 718.5 156   Total Intake(mL/kg) 3640.3 (45.6) 485 (6.1)   Urine (mL/kg/hr) 1400 (0.7) 195 (0.8)   Emesis/NG output 320 (0.2)    Drains 190 (0.1)    Total Output 1910 195   Net +1730.3 +290         PHYSICAL EXAMINATION: General:  Intermittent tachypnea / diaphoresis improving with opioids Neuro:  Awake, alert, answers questions via  interpreter HEENT:  OETT Cardiovascular:  Regular, tachycardic at times Lungs:  Bilateral air entry, no added sounds Abdomen:  Abdomen somewhat distended, bowel sounds diminished Musculoskeletal:  Intact  Skin:  Intact   LABS:  CBC  Recent Labs Lab 01/08/14 1615 01/09/14 0505 01/10/14 0525  WBC 18.5* 15.1* 14.5*  HGB 13.3 11.2* 10.8*  HCT 38.1* 33.6* 32.1*  PLT 188 193 240   Coag's No results found for this basename: APTT, INR,  in the last 168 hours  BMET  Recent Labs Lab 01/08/14 1615 01/09/14 0505 01/10/14 0525  NA 134* 138 137  K 3.8 3.7 3.9  CL 95* 100 102  CO2 BUN 25* 25* 24*  CREATININE 0.92 1.09 0.98  GLUCOSE 131* 98 111*   Electrolytes  Recent Labs Lab 01/08/14 1615 01/09/14 0505 01/10/14 0525  CALCIUM 8.4 7.6* 7.1*  MG 2.2 2.5 2.3  PHOS 1.6* 1.7* 2.4   Sepsis Markers  Recent Labs Lab 01/08/14 1630  LATICACIDVEN 1.9   ABG  Recent Labs Lab 01/08/14 1608 01/08/14 1825 01/09/14 0350  PHART 7.436 7.362 7.377  PCO2ART 37.4 49.1* 49.8*  PO2ART 59.6* 208.0* 106.0*   Liver Enzymes  Recent Labs Lab 01/08/14 1615 01/09/14 0505 01/10/14 0525  AST 49* 31 39*  ALT ALKPHOS 59 46 58  BILITOT 1.2 1.3* 1.9*  ALBUMIN 2.0*  1.8* 1.5*   Cardiac Enzymes  Recent Labs Lab 01/08/14 0938  01/08/14 2240 01/09/14 0505 01/09/14 1000  TROPONINI <0.30  < > <0.30 <0.30 <0.30  PROBNP 334.9*  --   --   --   --   < > = values in this interval not displayed. Glucose  Recent Labs Lab 01/08/14 1833 01/09/14 1215 01/09/14 1528 01/09/14 1957 01/09/14 2347 01/10/14 0621  GLUCAP 114* 112* 99 136* 119* 124*   IMAGING:   Ct Abdomen Pelvis W Contrast  01/09/2014   CLINICAL DATA:  Ruptured appendicitis 3 days ago now with increasing abdominal distention.  EXAM: CT ABDOMEN AND PELVIS WITH CONTRAST  TECHNIQUE: Multidetector CT imaging of the abdomen and pelvis was performed using the standard protocol following bolus  administration of intravenous contrast.  CONTRAST:  OMNIPAQUE IOHEXOL 300 MG/ML SOLN intravenously ; the patient also received oral contrast material.  COMPARISON:  Abdominal pelvic CT scan of January 05, 2014  FINDINGS: Contrast is present in the stomach and proximal and mid small bowel. The small bowel loops are mildly distended with gas and the contrast. There is a loop of unopacified distal small bowel on images 61-82 position superior and medial to the drainage catheter. There is a small amount of increased density within the adjacent extraluminal soft tissues demonstrated on images 63 through 69. This likely reflects a suture line though a small amount of extraluminal contrast is not absolutely excluded. Small amounts of low density interloop fluid persists. There is no discrete drainable abscess.  There is contrast in the colon likely remaining from the previous CT scan. There is mild thickening of the colonic wall of the ascending colon and rectosigmoid without adjacent abscess formation.  The liver, gallbladder, pancreas, spleen, adrenal glands, and kidneys exhibit no acute abnormalities. There is a moderate amount of fluid along the inferior surface of the liver with a smaller amount in the paracolic gutter. The stomach contains a nasogastric tube. There is a small amount of free fluid in the pelvis. The urinary bladder is partially distended and contains a Foley catheter. The lung bases exhibit parenchymal consolidation posteriorly consistent with pneumonia.  IMPRESSION: 1. There is a small bowel ileus. There remains interloop fluid as well as considerable fluid in the subhepatic region. A drainage catheter is in place in the right lower abdomen and upper pelvis. A small amount of radiodense material adjacent to the drainage catheter on images 64-66 likely reflects suture, but a tiny amount of extraluminal contrast is not excluded. No extraluminal contrast collections are demonstrated elsewhere.  There is no no new discrete drainable abscess. 2. The small bowel gas pattern is consistent with an ileus. There is a loop of thick-walled un-opacified distal small bowel in the right lower quadrant. 3. The gallbladder is mildly distended without evidence of stones. 4. There is bibasilar pneumonia.   Electronically Signed   By: David  [place]   On: 01/09/2014 11:47   Dg Chest Port 1 View  01/09/2014   CLINICAL DATA:  Ventilator dependence.  EXAM: PORTABLE CHEST - 1 VIEW  COMPARISON:  01/08/2014  FINDINGS: 0512 hrs. Lung volumes are low. Endotracheal tube tip is approximately 2.0 cm above the base of the chronic. A left IJ central line remains in place with tip position in the upper to mid right atrium. NG tube is looped in the stomach with the tip overlying the fundus. Pulmonary vascular congestion persists with basilar atelectasis and small bilateral pleural effusions. Gaseous dilatation of small bowel in  the left abdomen does not appear substantially changed in the interval.  IMPRESSION: Low volume film with vascular congestion and bibasilar atelectasis. Small bilateral pleural effusions persist.  Gaseous small bowel dilatation in the visualized left upper abdomen.   Electronically Signed   By: Kennith Center M.D.   On: 01/09/2014 07:02   ASSESSMENT / PLAN:  PULMONARY A:  Acute hypoxemic respiratory failure in setting of perforated viscus and abdominal surgery P:   Goal pH>7.30, SpO2>92 Continuous mechanical support VAP bundle Daily SBT Trend ABG/CXR  CARDIOVASCULAR HTN P:  Goal BP < 160/90 Hydralazine PRN  RENAL A:   Normal renal function P:   Trend BMP  GASTROINTESTINAL A:   Ruptured appendicitis Ileus GI Px Nutrition P:   NPO NGT to Sx TPN Protonix   HEMATOLOGIC A:   VTE Px P:  Heparin [place]  INFECTIOUS A:   Appendicitis, peritonitis, abscess s/p appendectomy and drainage 8/27 Abscess culture 8/27 >>> polymicrobial P:   Zosyn 8/27 >>> Stop Vancomycin 8/30 >>>  9/1 Add Micafungin 9/1 >>> Follow blood cx 8/30 >>>  ENDOCRINE A: At risk for hyperglycemia with TNA   P:   SSI  NEUROLOGIC A:   Possible opioid use / abuse prior to admission? Possible opioid withdrawal ( high tolerance, fever, agitation / diaphoresis promptly responding to IV opioids ) P:   RASS goal 0 to -1 Precedex gtt D/c fentanyl gtt Start Fentanyl patch Start Dilaudid PRN Versed PRN  I have personally obtained history, examined patient, evaluated and interpreted laboratory and imaging results, reviewed medical records, formulated assessment / plan and placed orders.  CRITICAL CARE:  The patient is critically ill with multiple organ systems failure and requires high complexity decision making for assessment and support, frequent evaluation and titration of therapies, application of advanced monitoring technologies and extensive interpretation of multiple databases. Critical Care Time devoted to patient care services described in this note is 35 minutes.   Lonia Farber, MD Pulmonary and Critical Care Medicine [facility] Pager: [phone]  01/10/2014, 10:04 AM

## 2014-01-11 LAB — CBC
HCT: 32.5 % — ABNORMAL LOW (ref 39.0–52.0)
HEMOGLOBIN: 10.6 g/dL — AB (ref 13.0–17.0)
MCH: 27.5 pg (ref 26.0–34.0)
MCHC: 32.6 g/dL (ref 30.0–36.0)
MCV: 84.2 fL (ref 78.0–100.0)
Platelets: 306 10*3/uL (ref 150–400)
RBC: 3.86 MIL/uL — ABNORMAL LOW (ref 4.22–5.81)
RDW: 14.4 % (ref 11.5–15.5)
WBC: 17.2 10*3/uL — ABNORMAL HIGH (ref 4.0–10.5)

## 2014-01-11 LAB — MAGNESIUM: MAGNESIUM: 2 mg/dL (ref 1.5–2.5)

## 2014-01-11 LAB — GLUCOSE, CAPILLARY
Glucose-Capillary: 128 mg/dL — ABNORMAL HIGH (ref 70–99)
Glucose-Capillary: 139 mg/dL — ABNORMAL HIGH (ref 70–99)
Glucose-Capillary: 144 mg/dL — ABNORMAL HIGH (ref 70–99)
Glucose-Capillary: 160 mg/dL — ABNORMAL HIGH (ref 70–99)

## 2014-01-11 LAB — BASIC METABOLIC PANEL
ANION GAP: 11 (ref 5–15)
BUN: 19 mg/dL (ref 6–23)
CO2: 25 mEq/L (ref 19–32)
Calcium: 7.4 mg/dL — ABNORMAL LOW (ref 8.4–10.5)
Chloride: 101 mEq/L (ref 96–112)
Creatinine, Ser: 0.88 mg/dL (ref 0.50–1.35)
GLUCOSE: 150 mg/dL — AB (ref 70–99)
POTASSIUM: 3.7 meq/L (ref 3.7–5.3)
Sodium: 137 mEq/L (ref 137–147)

## 2014-01-11 LAB — PHOSPHORUS: PHOSPHORUS: 2.7 mg/dL (ref 2.3–4.6)

## 2014-01-11 MED ORDER — SODIUM CHLORIDE 0.9 % IV SOLN
1.0000 g | Freq: Three times a day (TID) | INTRAVENOUS | Status: DC
  Administered 2014-01-11 – 2014-01-24 (×39): 1 g via INTRAVENOUS
  Filled 2014-01-11 (×42): qty 1

## 2014-01-11 MED ORDER — FAT EMULSION 20 % IV EMUL
250.0000 mL | INTRAVENOUS | Status: AC
  Administered 2014-01-11: 250 mL via INTRAVENOUS
  Filled 2014-01-11: qty 250

## 2014-01-11 MED ORDER — VANCOMYCIN HCL IN DEXTROSE 1-5 GM/200ML-% IV SOLN
1000.0000 mg | Freq: Three times a day (TID) | INTRAVENOUS | Status: DC
  Administered 2014-01-11 – 2014-01-12 (×3): 1000 mg via INTRAVENOUS
  Filled 2014-01-11 (×5): qty 200

## 2014-01-11 MED ORDER — TRACE MINERALS CR-CU-F-FE-I-MN-MO-SE-ZN IV SOLN
INTRAVENOUS | Status: AC
  Administered 2014-01-11: 17:00:00 via INTRAVENOUS
  Filled 2014-01-11: qty 2000

## 2014-01-11 NOTE — Progress Notes (Signed)
PULMONARY / CRITICAL CARE MEDICINE  Name: Jesus Hunter MRN: [id number] DOB: 08/14/1970    ADMISSION DATE:  01/05/2014 CONSULTATION DATE:  01/08/2014  REFERRING MD :  Catha Gosselin  CHIEF COMPLAINT:  Respiratory distress  INITIAL PRESENTATION: 43 yo presented 8/27 with ruptured appendix, taken to surgery for laparoscopic appendectomy and drainage of intra-abd abscess. On 8/30 developed sepsis and respiratory distress requiring intubation.  STUDIES / EVENTS:  8/27  Abdomen CT >>>  Ruptured appendix  8/30  Sepsis / acute respiratory failure >>> intubated 8/31  Abdomen CT >>> Ileus, mildly distended gallbladder without stones, bibasal atelectasis  9/1    Opioids withdrawal suspected 9/2    Extubated   INTERVAL HISTORY:  Doing well on SBT  VITAL SIGNS: Temp:  [98.5 F (36.9 C)-102.5 F (39.2 C)] 101.5 F (38.6 C) (09/02 0830) Pulse Rate:  [73-98] 85 (09/02 1100) Resp:  [16-32] 26 (09/02 1100) BP: (113-156)/(70-96) 150/91 mmHg (09/02 1100) SpO2:  [97 %-100 %] 97 % (09/02 1100) FiO2 (%):  [40 %] 40 % (09/02 0830) Weight:  [81.7 kg (180 lb 1.9 oz)] 81.7 kg (180 lb 1.9 oz) (09/02 0250)  HEMODYNAMICS:   VENTILATOR SETTINGS: Vent Mode:  [-] PSV;CPAP FiO2 (%):  [40 %] 40 % Set Rate:  [18 bmp] 18 bmp Vt Set:  [440 mL] 440 mL PEEP:  [5 cmH20] 5 cmH20 Pressure Support:  [5 cmH20] 5 cmH20 Plateau Pressure:  [27 cmH20] 27 cmH20  INTAKE / OUTPUT: Intake/Output     09/01 0701 - 09/02 0700 09/02 0701 - 09/03 0700   I.V. (mL/kg) 833.7 (10.2) 122.8 (1.5)   Other 455    NG/GT     IV Piggyback 300    TPN 1770 372   Total Intake(mL/kg) 3358.7 (41.1) 494.8 (6.1)   Urine (mL/kg/hr) 1350 (0.7) 175 (0.5)   Emesis/NG output 50 (0)    Drains 70 (0) 30 (0.1)   Total Output 1470 205   Net +1888.7 +289.8         PHYSICAL EXAMINATION: General: Appropriate VT/Ve on SBT 5/5 Neuro:  Awake, alert HEENT:  OETT Cardiovascular:  Regular, tachycardic Lungs:  CTAB Abdomen:  Abdomen soft, bowel sounds  diminished Musculoskeletal:  Intact  Skin:  Intact   LABS:  CBC  Recent Labs Lab 01/09/14 0505 01/10/14 0525 01/11/14 0300  WBC 15.1* 14.5* 17.2*  HGB 11.2* 10.8* 10.6*  HCT 33.6* 32.1* 32.5*  PLT 193 240 306   Coag's No results found for this basename: APTT, INR,  in the last 168 hours  BMET  Recent Labs Lab 01/09/14 0505 01/10/14 0525 01/11/14 0300  NA 138 137 137  K 3.7 3.9 3.7  CL 100 102 101  CO2 BUN 25* 24* 19  CREATININE 1.09 0.98 0.88  GLUCOSE 98 111* 150*   Electrolytes  Recent Labs Lab 01/09/14 0505 01/10/14 0525 01/11/14 0300  CALCIUM 7.6* 7.1* 7.4*  MG 2.5 2.3 2.0  PHOS 1.7* 2.4 2.7   Sepsis Markers  Recent Labs Lab 01/08/14 1630  LATICACIDVEN 1.9   ABG  Recent Labs Lab 01/08/14 1608 01/08/14 1825 01/09/14 0350  PHART 7.436 7.362 7.377  PCO2ART 37.4 49.1* 49.8*  PO2ART 59.6* 208.0* 106.0*   Liver Enzymes  Recent Labs Lab 01/08/14 1615 01/09/14 0505 01/10/14 0525  AST 49* 31 39*  ALT ALKPHOS 59 46 58  BILITOT 1.2 1.3* 1.9*  ALBUMIN 2.0* 1.8* 1.5*   Cardiac Enzymes  Recent Labs Lab 01/08/14 4098  01/08/14  2240 01/09/14 0505 01/09/14 1000  TROPONINI <0.30  < > <0.30 <0.30 <0.30  PROBNP 334.9*  --   --   --   --   < > = values in this interval not displayed. Glucose  Recent Labs Lab 01/09/14 2347 01/10/14 0621 01/10/14 1158 01/10/14 1808 01/11/14 0005 01/11/14 0646  GLUCAP 119* 124* 110* 147* 160* 139*   IMAGING:   Dg Abd 1 View  01/10/2014   CLINICAL DATA:  Nasogastric tube placement.  EXAM: ABDOMEN - 1 VIEW  COMPARISON:  Abdominal CT from yesterday  FINDINGS: Gastric suction tube tip is at the level of the pylorus. Persistent dilatation of small bowel, measuring up to 7 cm in diameter. Previously administered oral contrast has progressed distally, visible currently in the descending colon. Surgical drain again noted in the right lower quadrant.  IMPRESSION: 1. Nasogastric tube tip at  the pylorus. 2. Unchanged small bowel dilatation.   Electronically Signed   By: Tiburcio Pea M.D.   On: 01/10/2014 13:38   Dg Chest Port 1 View  01/10/2014   CLINICAL DATA:  Sepsis.  Respiratory distress.  EXAM: PORTABLE CHEST - 1 VIEW  COMPARISON:  Single view of the chest 01/09/2014 and 01/08/2014.  FINDINGS: Support tubes and lines are unchanged. Lung volumes are low with basilar airspace opacities, worse on the left. No pneumothorax is identified. Heart size is normal.  IMPRESSION: Left worse than right basilar airspace opacities are likely due to atelectasis in this low volume chest. The appearance is not markedly changed.   Electronically Signed   By: Drusilla Kanner M.D.   On: 01/10/2014 07:30   ASSESSMENT / PLAN:  PULMONARY A:  Acute hypoxemic respiratory failure in setting of perforated viscus, abdominal surgery and possible opioid withdrawal Bibasilar atelectasis, less likely pneumionia P:   Extubate Supplemental oxygen Goal SpO2>92 Incentive spirometry / flutter valve  CARDIOVASCULAR A: HTN P:  Goal BP < 160/90 Hydralazine PRN  RENAL A:   Normal renal function P:   Trend BMP  GASTROINTESTINAL A:   Ruptured appendicitis Ileus GI Px is not required Nutrition P:   NPO NGT to Sx TPN D/c Protonix   HEMATOLOGIC A:   VTE Px P:  Heparin [place]  INFECTIOUS A:   Appendicitis, peritonitis, abscess s/p appendectomy and drainage 8/27 Abscess culture 8/27 >>> polymicrobial Possible HCAP P:   Stop Zosyn 8/27 >>> 9/2 Start Meropenem 9/2 >>> Re-start Vancomycin 8/30 >>>  Micafungin 9/1 >>> Follow blood cx 8/30 >>>  ENDOCRINE A: At risk for hyperglycemia with TNA   P:   SSI  NEUROLOGIC A:   Possible opioid use / abuse prior to admission? Possible opioid withdrawal ( high tolerance, fever, agitation / diaphoresis promptly responding to IV opioids ) P:   Wean Precedex gtt Fentanyl patch Dilaudid PRN D/c [place]  I have personally obtained history,  examined patient, evaluated and interpreted laboratory and imaging results, reviewed medical records, formulated assessment / plan and placed orders.  CRITICAL CARE:  The patient is critically ill with multiple organ systems failure and requires high complexity decision making for assessment and support, frequent evaluation and titration of therapies, application of advanced monitoring technologies and extensive interpretation of multiple databases. Critical Care Time devoted to patient care services described in this note is 35 minutes.   Lonia Farber, MD Pulmonary and Critical Care Medicine [facility] Pager: [phone]  01/11/2014, 11:29 AM

## 2014-01-11 NOTE — Progress Notes (Addendum)
NUTRITION FOLLOW UP  Intervention:    Continue TPN dosing per Pharmacy to meet >90% of estimated nutrition needs.  Nutrition Dx:   Inadequate oral intake related to inability to eat as evidenced by NPO status. Ongoing.  Goal:   Intake to meet >90% of estimated nutrition needs. Met.  Monitor:   TPN tolerance/adequacy, weight trend, labs, vent status.  Assessment:   43 year old [place] male who presented with 4 days of RLQ abdominal pain. CT of abdomen and pelvis showed ruptured appendicitis and possible abscess. S/P lap appendectomy with drainage of intra-abdominal abscess.   Patient is receiving TPN with Clinimix E 5/15 @ 83 ml/hr and lipids @ 10 ml/hr. Provides 2232 ml, 1894 kcal, and 100 grams protein per day. Meets 95% minimum estimated energy needs and 100% minimum estimated protein needs.  Patient was extubated this morning.  Height: Ht Readings from Last 1 Encounters:  01/08/14 5' 2" (1.575 m)    Weight Status:  Up with positive fluid status Wt Readings from Last 1 Encounters:  01/11/14 180 lb 1.9 oz (81.7 kg)  01/09/14  168 lb 14 oz (76.6 kg)   Re-estimated needs:  Kcal: 2000-2100  Protein: 100-115 gm  Fluid: >/= 2 L  Skin: surgical incision to abdomen  Diet Order: NPO   Intake/Output Summary (Last 24 hours) at 01/11/14 0936 Last data filed at 01/11/14 0900  Gross per 24 hour  Intake 3024.67 ml  Output   1585 ml  Net 1439.67 ml    Last BM: 8/30   Labs:   Recent Labs Lab 01/09/14 0505 01/10/14 0525 01/11/14 0300  NA 138 137 137  K 3.7 3.9 3.7  CL 100 102 101  CO2 _0 BUN 25* 24* 19  CREATININE 1.09 0.98 0.88  CALCIUM 7.6* 7.1* 7.4*  MG 2.5 2.3 2.0  PHOS 1.7* 2.4 2.7  GLUCOSE 98 111* 150*    CBG (last 3)   Recent Labs  01/10/14 1808 01/11/14 0005 01/11/14 0646  GLUCAP 147* 160* 139*    Scheduled Meds: . antiseptic oral rinse  7 mL Mouth Rinse QID  . chlorhexidine  15 mL Mouth Rinse BID  . fentaNYL  50 mcg Transdermal  Q72H  . heparin subcutaneous  5,000 Units Subcutaneous 3 times per day  . insulin aspart  0-9 Units Subcutaneous 4 times per day  . micafungin [facility]) IV  100 mg Intravenous Daily  . pantoprazole (PROTONIX) IV  40 mg Intravenous QHS  . piperacillin-tazobactam (ZOSYN)  IV  3.375 g Intravenous 3 times per day    Continuous Infusions: . Marland KitchenTPN (CLINIMIX-E) Adult 83 mL/hr at 01/10/14 1735   And  . fat emulsion 250 mL (01/10/14 1735)  . Marland KitchenTPN (CLINIMIX-E) Adult     And  . fat emulsion    . sodium chloride 10 mL/hr (01/10/14 0800)  . dexmedetomidine 1.2 mcg/kg/hr (01/11/14 0644)  . fentaNYL infusion INTRAVENOUS 200 mcg/hr (01/10/14 2307)    Molli Barrows, RD, LDN, [place] Pager [phone] After Hours Pager [phone]

## 2014-01-11 NOTE — Progress Notes (Signed)
PARENTERAL NUTRITION CONSULT NOTE - FOLLOW UP  Pharmacy Consult:  TPN Indication: prolonged ileus  No Known Allergies  Patient Measurements: Height:  (157.5 cm) Weight: 180 lb 1.9 oz (81.7 kg) IBW/kg (Calculated) : 54.6 Adjusted Body Weight: 61.2 kg  Vital Signs: Temp: 100.1 F (37.8 C) (09/02 0430) Temp src: Oral (09/02 0430) BP: 156/85 mmHg (09/02 0800) Pulse Rate: 87 (09/02 0800) Intake/Output from previous day: 09/01 0701 - 09/02 0700 In: 3348.7 [I.V.:823.7; IV Piggyback:300; TPN:1770] Out: 1470 [Urine:1350; Emesis/NG output:50; Drains:70]  Labs:  Recent Labs  01/09/14 0505 01/10/14 0525 01/11/14 0300  WBC 15.1* 14.5* 17.2*  HGB 11.2* 10.8* 10.6*  HCT 33.6* 32.1* 32.5*  PLT 193 240 306     Recent Labs  01/08/14 1615 01/09/14 0505 01/10/14 0525 01/11/14 0300  NA 134* 138 137 137  K 3.8 3.7 3.9 3.7  CL 95* 100 102 101  CO2 GLUCOSE 131* 98 111* 150*  BUN 25* 25* 24* 19  CREATININE 0.92 1.09 0.98 0.88  CALCIUM 8.4 7.6* 7.1* 7.4*  MG 2.2 2.5 2.3 2.0  PHOS 1.6* 1.7* 2.4 2.7  PROT 6.9 5.9* 5.4*  --   ALBUMIN 2.0* 1.8* 1.5*  --   AST 49* 31 39*  --   ALT --   ALKPHOS 59 46 58  --   BILITOT 1.2 1.3* 1.9*  --   PREALBUMIN  --   --  8.1*  --   TRIG  --   --  235*  --    Estimated Creatinine Clearance: 99.1 ml/min (by C-G formula based on Cr of 0.88).    Recent Labs  01/10/14 1158 01/10/14 1808 01/11/14 0005  GLUCAP 110* 147* 160*     Insulin Requirements in the past 24 hours:  2 units sensitive SSI  Assessment: Jesus Hunter s/p laparoscopic appendectomy for gangrenous ruptured appendicitis.  Pharmacy managing TPN for prolonged ileus.  Admit: RLQ abd pain GI: POD#6 appendectomy, perforated viscous >> ileus, NGT to LWIS >> O/P decreasing, prealbumin low at 8.1 Endo: no hx DM, CBGs acceptable Lytes: all WNL Renal: SCr 0.88 (stable) - decent UOP 0.7 ml/kg/hr Pulm: intubated, FiO2 40% Cards: HLD / HTN - BP mostly  normal Hepatobil: mildly elevated AST, TG elevated at 325, tbili mildly elevated - ok to continue supplementing trace elements Neuro: Fentanyl patch + Precedex, no neuro assessment documented ID: Zosyn/Mycamine for appendicitis + peritonitis + abscess s/p drainage 8/27, Tmax 101.2, WBC elevated and trending up Best Practices: heparin SQ, MC, PPI IV TPN Access: triple lumen placed 01/08/14 TPN day#: 2 (8/31 >> )  Current Nutrition:  TPN  Nutritional Goals:  2000-2100 kCal and 100-115gm protein daily   Plan:  - Continue Clinimix E 5/15 at 83 ml/hr + IVFE at 10 ml/hr.  TPN providing 1894 kCal and 100gm protein daily, meeting 95% of minimal kCal and 100% of minimal protein needs. - Daily multivitamin and trace elements, watch tbili - If CBGs remain controlled, consider changing to E 5/20 formulation to better meet patient's kCal needs - Consider d/c'ing SSI/CBG checks if CBGs remain controlled at stable TPN rate - Watch TG and decrease lipid provision as appropriate - F/U AM labs    Ralphael Southgate D. Laney Potash, PharmD, BCPS Pager:  [phone] 01/11/2014, 8:36 AM

## 2014-01-11 NOTE — Procedures (Signed)
Extubation Procedure Note  Patient Details:   Name: Jesus Hunter DOB: 1970/08/22 MRN: [id number]   Airway Documentation:     Evaluation  O2 sats: stable throughout Complications: No apparent complications Patient did tolerate procedure well. Bilateral Breath Sounds: Diminished;Rhonchi Suctioning: Airway Yes IS 4l/min [place] Good vocalization  Jesus Hunter 01/11/2014, 10:43 AM

## 2014-01-11 NOTE — Progress Notes (Signed)
Patient ID: Jesus Hunter, male   DOB: [birth year], 43 y.o.   MRN: [id number]     [address] [place], [place] [address]    Phone: [phone] FAX: [phone]     Subjective: Awake on vent, weaning.  Drain output decreased serous.  WBC up today, fevers.  Objective:  Vital signs:  Filed Vitals:   01/11/14 0700 01/11/14 0800 01/11/14 0830 01/11/14 0900  BP: 140/85 156/85  153/87  Pulse: 86 87  87  Temp:   101.5 F (38.6 C)   TempSrc:   Oral   Resp: _0 Height:      Weight:      SpO2: 98% 99%  99%    Last BM Date: 01/08/14  Intake/Output   Yesterday:  09/01 0701 - 09/02 0700 In: 3358.7 [I.V.:833.7; IV Piggyback:300; TPN:1770] Out: 5681 [Urine:1350; Emesis/NG output:50; Drains:70] This shift:  Total I/O In: 66 [I.V.:66] Out: 205 [Urine:175; Drains:30]   Physical Exam:  General: Pt awake on vent  Chest: cta. On vent.  CV: Pulses intact. Regular rhythm.  Abdomen: Soft. distended. Mildly tender at incisions only. RLQ JP drain with serous output. No evidence of peritonitis. No incarcerated hernias.  Ext: SCDs BLE. No mjr edema. No cyanosis  Skin: No petechiae / purpura   Problem List:   Active Problems:   Acute perforated appendicitis   Acute respiratory failure   HTN (hypertension)   Sepsis    Results:   Labs: Results for orders placed during the hospital encounter of 01/05/14 (from the past 48 hour(s))  GLUCOSE, CAPILLARY     Status: Abnormal   Collection Time    01/09/14 12:15 PM      Result Value Ref Range   Glucose-Capillary 112 (*) 70 - 99 mg/dL  GLUCOSE, CAPILLARY     Status: None   Collection Time    01/09/14  3:28 PM      Result Value Ref Range   Glucose-Capillary 99  70 - 99 mg/dL  GLUCOSE, CAPILLARY     Status: Abnormal   Collection Time    01/09/14  7:57 PM      Result Value Ref Range   Glucose-Capillary 136 (*) 70 - 99 mg/dL  GLUCOSE, CAPILLARY     Status:  Abnormal   Collection Time    01/09/14 11:47 PM      Result Value Ref Range   Glucose-Capillary 119 (*) 70 - 99 mg/dL  PHOSPHORUS     Status: None   Collection Time    01/10/14  5:25 AM      Result Value Ref Range   Phosphorus 2.4  2.3 - 4.6 mg/dL  COMPREHENSIVE METABOLIC PANEL     Status: Abnormal   Collection Time    01/10/14  5:25 AM      Result Value Ref Range   Sodium 137  137 - 147 mEq/L   Potassium 3.9  3.7 - 5.3 mEq/L   Chloride 102  96 - 112 mEq/L   CO2 26  19 - 32 mEq/L   Glucose, Bld 111 (*) 70 - 99 mg/dL   BUN 24 (*) 6 - 23 mg/dL   Creatinine, Ser 0.98  0.50 - 1.35 mg/dL   Calcium 7.1 (*) 8.4 - 10.5 mg/dL   Total Protein 5.4 (*) 6.0 - 8.3 g/dL   Albumin 1.5 (*) 3.5 - 5.2 g/dL   AST 39 (*) 0 - 37  U/L   ALT 30  0 - 53 U/L   Alkaline Phosphatase 58  39 - 117 U/L   Total Bilirubin 1.9 (*) 0.3 - 1.2 mg/dL   GFR calc non Af Amer >90  >90 mL/min   GFR calc Af Amer >90  >90 mL/min   Comment: (NOTE)     The eGFR has been calculated using the CKD EPI equation.     This calculation has not been validated in all clinical situations.     eGFR's persistently <90 mL/min signify possible Chronic Kidney     Disease.   Anion gap 9  5 - 15  MAGNESIUM     Status: None   Collection Time    01/10/14  5:25 AM      Result Value Ref Range   Magnesium 2.3  1.5 - 2.5 mg/dL  PREALBUMIN     Status: Abnormal   Collection Time    01/10/14  5:25 AM      Result Value Ref Range   Prealbumin 8.1 (*) 17.0 - 34.0 mg/dL   Comment: Performed at [facility]  CBC     Status: Abnormal   Collection Time    01/10/14  5:25 AM      Result Value Ref Range   WBC 14.5 (*) 4.0 - 10.5 K/uL   RBC 3.95 (*) 4.22 - 5.81 MIL/uL   Hemoglobin 10.8 (*) 13.0 - 17.0 g/dL   HCT 32.1 (*) 39.0 - 52.0 %   MCV 81.3  78.0 - 100.0 fL   MCH 27.3  26.0 - 34.0 pg   MCHC 33.6  30.0 - 36.0 g/dL   RDW 14.3  11.5 - 15.5 %   Platelets 240  150 - 400 K/uL  TRIGLYCERIDES     Status: Abnormal   Collection Time     01/10/14  5:25 AM      Result Value Ref Range   Triglycerides 235 (*) <150 mg/dL  GLUCOSE, CAPILLARY     Status: Abnormal   Collection Time    01/10/14  6:21 AM      Result Value Ref Range   Glucose-Capillary 124 (*) 70 - 99 mg/dL   Comment 1 Documented in Chart     Comment 2 Notify RN    GLUCOSE, CAPILLARY     Status: Abnormal   Collection Time    01/10/14 11:58 AM      Result Value Ref Range   Glucose-Capillary 110 (*) 70 - 99 mg/dL  GLUCOSE, CAPILLARY     Status: Abnormal   Collection Time    01/10/14  6:08 PM      Result Value Ref Range   Glucose-Capillary 147 (*) 70 - 99 mg/dL  GLUCOSE, CAPILLARY     Status: Abnormal   Collection Time    01/11/14 12:05 AM      Result Value Ref Range   Glucose-Capillary 160 (*) 70 - 99 mg/dL   Comment 1 Documented in Chart     Comment 2 Notify RN    BASIC METABOLIC PANEL     Status: Abnormal   Collection Time    01/11/14  3:00 AM      Result Value Ref Range   Sodium 137  137 - 147 mEq/L   Potassium 3.7  3.7 - 5.3 mEq/L   Chloride 101  96 - 112 mEq/L   CO2 25  19 - 32 mEq/L   Glucose, Bld 150 (*) 70 - 99 mg/dL   BUN 19  6 - 23 mg/dL   Creatinine, Ser 0.88  0.50 - 1.35 mg/dL   Calcium 7.4 (*) 8.4 - 10.5 mg/dL   GFR calc non Af Amer >90  >90 mL/min   GFR calc Af Amer >90  >90 mL/min   Comment: (NOTE)     The eGFR has been calculated using the CKD EPI equation.     This calculation has not been validated in all clinical situations.     eGFR's persistently <90 mL/min signify possible Chronic Kidney     Disease.   Anion gap 11  5 - 15  MAGNESIUM     Status: None   Collection Time    01/11/14  3:00 AM      Result Value Ref Range   Magnesium 2.0  1.5 - 2.5 mg/dL  PHOSPHORUS     Status: None   Collection Time    01/11/14  3:00 AM      Result Value Ref Range   Phosphorus 2.7  2.3 - 4.6 mg/dL  CBC     Status: Abnormal   Collection Time    01/11/14  3:00 AM      Result Value Ref Range   WBC 17.2 (*) 4.0 - 10.5 K/uL   RBC 3.86  (*) 4.22 - 5.81 MIL/uL   Hemoglobin 10.6 (*) 13.0 - 17.0 g/dL   HCT 32.5 (*) 39.0 - 52.0 %   MCV 84.2  78.0 - 100.0 fL   MCH 27.5  26.0 - 34.0 pg   MCHC 32.6  30.0 - 36.0 g/dL   RDW 14.4  11.5 - 15.5 %   Platelets 306  150 - 400 K/uL  GLUCOSE, CAPILLARY     Status: Abnormal   Collection Time    01/11/14  6:46 AM      Result Value Ref Range   Glucose-Capillary 139 (*) 70 - 99 mg/dL   Comment 1 Documented in Chart     Comment 2 Notify RN      Imaging / Studies: Dg Abd 1 View  01/10/2014   CLINICAL DATA:  Nasogastric tube placement.  EXAM: ABDOMEN - 1 VIEW  COMPARISON:  Abdominal CT from yesterday  FINDINGS: Gastric suction tube tip is at the level of the pylorus. Persistent dilatation of small bowel, measuring up to 7 cm in diameter. Previously administered oral contrast has progressed distally, visible currently in the descending colon. Surgical drain again noted in the right lower quadrant.  IMPRESSION: 1. Nasogastric tube tip at the pylorus. 2. Unchanged small bowel dilatation.   Electronically Signed   By: Jorje Guild M.D.   On: 01/10/2014 13:38   Ct Abdomen Pelvis W Contrast  01/09/2014   CLINICAL DATA:  Ruptured appendicitis 3 days ago now with increasing abdominal distention.  EXAM: CT ABDOMEN AND PELVIS WITH CONTRAST  TECHNIQUE: Multidetector CT imaging of the abdomen and pelvis was performed using the standard protocol following bolus administration of intravenous contrast.  CONTRAST:  125m OMNIPAQUE IOHEXOL 300 MG/ML SOLN intravenously ; the patient also received oral contrast material.  COMPARISON:  Abdominal pelvic CT scan of January 05, 2014  FINDINGS: Contrast is present in the stomach and proximal and mid small bowel. The small bowel loops are mildly distended with gas and the contrast. There is a loop of unopacified distal small bowel on images 61-82 position superior and medial to the drainage catheter. There is a small amount of increased density within the adjacent  extraluminal soft tissues demonstrated on images 63 through  69. This likely reflects a suture line though a small amount of extraluminal contrast is not absolutely excluded. Small amounts of low density interloop fluid persists. There is no discrete drainable abscess.  There is contrast in the colon likely remaining from the previous CT scan. There is mild thickening of the colonic wall of the ascending colon and rectosigmoid without adjacent abscess formation.  The liver, gallbladder, pancreas, spleen, adrenal glands, and kidneys exhibit no acute abnormalities. There is a moderate amount of fluid along the inferior surface of the liver with a smaller amount in the paracolic gutter. The stomach contains a nasogastric tube. There is a small amount of free fluid in the pelvis. The urinary bladder is partially distended and contains a Foley catheter. The lung bases exhibit parenchymal consolidation posteriorly consistent with pneumonia.  IMPRESSION: 1. There is a small bowel ileus. There remains interloop fluid as well as considerable fluid in the subhepatic region. A drainage catheter is in place in the right lower abdomen and upper pelvis. A small amount of radiodense material adjacent to the drainage catheter on images 64-66 likely reflects suture, but a tiny amount of extraluminal contrast is not excluded. No extraluminal contrast collections are demonstrated elsewhere. There is no no new discrete drainable abscess. 2. The small bowel gas pattern is consistent with an ileus. There is a loop of thick-walled un-opacified distal small bowel in the right lower quadrant. 3. The gallbladder is mildly distended without evidence of stones. 4. There is bibasilar pneumonia.   Electronically Signed   By: David  Martinique   On: 01/09/2014 11:47   Dg Chest Port 1 View  01/10/2014   CLINICAL DATA:  Sepsis.  Respiratory distress.  EXAM: PORTABLE CHEST - 1 VIEW  COMPARISON:  Single view of the chest 01/09/2014 and 01/08/2014.   FINDINGS: Support tubes and lines are unchanged. Lung volumes are low with basilar airspace opacities, worse on the left. No pneumothorax is identified. Heart size is normal.  IMPRESSION: Left worse than right basilar airspace opacities are likely due to atelectasis in this low volume chest. The appearance is not markedly changed.   Electronically Signed   By: Inge Rise M.D.   On: 01/10/2014 07:30    Medications / Allergies:  Scheduled Meds: . antiseptic oral rinse  7 mL Mouth Rinse QID  . chlorhexidine  15 mL Mouth Rinse BID  . fentaNYL  50 mcg Transdermal Q72H  . heparin subcutaneous  5,000 Units Subcutaneous 3 times per day  . insulin aspart  0-9 Units Subcutaneous 4 times per day  . micafungin [facility]) IV  100 mg Intravenous Daily  . pantoprazole (PROTONIX) IV  40 mg Intravenous QHS  . piperacillin-tazobactam (ZOSYN)  IV  3.375 g Intravenous 3 times per day   Continuous Infusions: . Marland KitchenTPN (CLINIMIX-E) Adult 83 mL/hr at 01/10/14 1735   And  . fat emulsion 250 mL (01/10/14 1735)  . Marland KitchenTPN (CLINIMIX-E) Adult     And  . fat emulsion    . sodium chloride 10 mL/hr (01/10/14 0800)  . dexmedetomidine 1.1 mcg/kg/hr (01/11/14 0800)  . fentaNYL infusion INTRAVENOUS 200 mcg/hr (01/10/14 2307)   PRN Meds:.acetaminophen (TYLENOL) oral liquid 160 mg/5 mL, acetaminophen, acetaminophen, albuterol, diphenhydrAMINE, diphenhydrAMINE, hydrALAZINE, HYDROmorphone (DILAUDID) injection, midazolam, ondansetron (ZOFRAN) IV  Antibiotics: Anti-infectives   Start     Dose/Rate Route Frequency Ordered Stop   01/10/14 1500  micafungin (MYCAMINE) 100 mg in sodium chloride 0.9 % 100 mL IVPB     100 mg 100 mL/hr over  1 Hours Intravenous Daily 01/10/14 1405     01/08/14 2200  vancomycin (VANCOCIN) IVPB 1000 mg/200 mL premix  Status:  Discontinued     1,000 mg 200 mL/hr over 60 Minutes Intravenous Every 12 hours 01/08/14 0913 01/10/14 1417   01/08/14 0945  vancomycin (VANCOCIN) 2,000 mg in sodium chloride  0.9 % 500 mL IVPB     2,000 mg 250 mL/hr over 120 Minutes Intravenous  Once 01/08/14 0913 01/08/14 1206   01/05/14 2200  piperacillin-tazobactam (ZOSYN) IVPB 3.375 g     3.375 g 12.5 mL/hr over 240 Minutes Intravenous 3 times per day 01/05/14 1847     01/05/14 1345  piperacillin-tazobactam (ZOSYN) IVPB 3.375 g     3.375 g 100 mL/hr over 30 Minutes Intravenous  Once 01/05/14 1343 01/05/14 1445        Assessment/Plan Gangrenous Ruptured Appendicitis---Dr. Hulen Skains 01/05/14  PCM  POD#6  -continue with IV antibiotics  ileus - NGT to LWIS  -Zosyn  -SCD/lovenox  -continue foley for I&O  -drain care(serous)  -cultures-multiple organisms present  -CT of abdomen/pelvis negative for abscess(8/31) shows bibasilar PNA, ?should Vanc be restarted.   -TPN  VDRF  -appreciate CCM management  -zosyn for PNA  AKI-stable   Erby Pian, ANP-BC [facility] Pager [phone](7A-4:30P) For consults and floor pages call [phone](7A-4:30P)  01/11/2014 10:19 AM

## 2014-01-11 NOTE — Progress Notes (Signed)
Pt extubated.   Looks a little SOB.  Wants paper work filled out for work.  Hopefully ileus will continue to resolve.

## 2014-01-11 NOTE — Progress Notes (Signed)
ANTIBIOTIC CONSULT NOTE - FOLLOW UP  Pharmacy Consult for Restart Vancomycin, Change to Merrem Indication: Persistent Fever, Peritonitis vs r/o PNA  No Known Allergies  Patient Measurements: Height:  (157.5 cm) Weight: 180 lb 1.9 oz (81.7 kg) IBW/kg (Calculated) : 54.6 Vital Signs: Temp: 98.9 F (37.2 C) (09/02 1208) Temp src: Oral (09/02 1208) BP: 150/91 mmHg (09/02 1100) Pulse Rate: 85 (09/02 1100) Intake/Output from previous day: 09/01 0701 - 09/02 0700 In: 3358.7 [I.V.:833.7; IV Piggyback:300; TPN:1770] Out: 1470 [Urine:1350; Emesis/NG output:50; Drains:70] Intake/Output from this shift: Total I/O In: 494.8 [I.V.:122.8; TPN:372] Out: 355 [Urine:325; Drains:30] Labs:  Recent Labs  01/09/14 0505 01/10/14 0525 01/11/14 0300  WBC 15.1* 14.5* 17.2*  HGB 11.2* 10.8* 10.6*  PLT 193 240 306  CREATININE 1.09 0.98 0.88   Estimated Creatinine Clearance: 99.1 ml/min (by C-G formula based on Cr of 0.88). No results found for this basename: VANCOTROUGH, VANCOPEAK, VANCORANDOM, GENTTROUGH, GENTPEAK, GENTRANDOM, TOBRATROUGH, TOBRAPEAK, TOBRARND, AMIKACINPEAK, AMIKACINTROU, AMIKACIN,  in the last 72 hours   Microbiology: Recent Results (from the past 720 hour(s))  BODY FLUID CULTURE     Status: None   Collection Time    01/05/14  4:27 PM      Result Value Ref Range Status   Specimen Description PERITONEAL FLUID   Final   Special Requests PATIENT ON FOLLOWING ZOSYN RECEIVED SWAB   Final   Gram Stain     Final   Value: NO WBC SEEN     ABUNDANT GRAM NEGATIVE RODS     ABUNDANT GRAM POSITIVE RODS     MODERATE GRAM POSITIVE COCCI     IN PAIRS Gram Stain Report Called to,Read Back By and Verified With: Gram Stain Report Called to,Read Back By and Verified With: Purcell Nails RN 01/06/14 0740AM BY MILSH   Culture     Final   Value: MULTIPLE ORGANISMS PRESENT, NONE PREDOMINANT     Performed at [facility]   Report Status 01/07/2014 FINAL   Final  ANAEROBIC CULTURE      Status: None   Collection Time    01/05/14  4:27 PM      Result Value Ref Range Status   Specimen Description PERITONEAL FLUID   Final   Special Requests PATIENT ON FOLLOWING ZOSYN RECEIVED SWAB   Final   Gram Stain     Final   Value: NO WBC SEEN     ABUNDANT GRAM NEGATIVE RODS     ABUNDANT GRAM POSITIVE RODS     MODERATE GRAM POSITIVE COCCI     IN PAIRS   Culture     Final   Value: NO ANAEROBES ISOLATED     Performed at [facility]   Report Status 01/10/2014 FINAL   Final  CULTURE, BLOOD (ROUTINE X 2)     Status: None   Collection Time    01/08/14 12:45 PM      Result Value Ref Range Status   Specimen Description BLOOD LEFT ARM   Final   Special Requests BOTTLES DRAWN AEROBIC ONLY 5CC   Final   Culture  Setup Time     Final   Value: 01/08/2014 18:43     Performed at [facility]   Culture     Final   Value:        BLOOD CULTURE RECEIVED NO GROWTH TO DATE CULTURE WILL BE HELD FOR 5 DAYS BEFORE ISSUING A FINAL NEGATIVE REPORT     Performed at [facility]  Report Status PENDING   Incomplete  CULTURE, BLOOD (ROUTINE X 2)     Status: None   Collection Time    01/08/14  1:00 PM      Result Value Ref Range Status   Specimen Description BLOOD LEFT HAND   Final   Special Requests BOTTLES DRAWN AEROBIC ONLY 8CC   Final   Culture  Setup Time     Final   Value: 01/08/2014 18:44     Performed at [facility]   Culture     Final   Value:        BLOOD CULTURE RECEIVED NO GROWTH TO DATE CULTURE WILL BE HELD FOR 5 DAYS BEFORE ISSUING A FINAL NEGATIVE REPORT     Performed at [facility]   Report Status PENDING   Incomplete  MRSA PCR SCREENING     Status: None   Collection Time    01/08/14  5:23 PM      Result Value Ref Range Status   MRSA by PCR NEGATIVE  NEGATIVE Final   Comment:            The GeneXpert MRSA Assay (FDA     approved for NASAL specimens     only), is one component of a     comprehensive MRSA colonization     surveillance  program. It is not     intended to diagnose MRSA     infection nor to guide or     monitor treatment for     MRSA infections.   Assessment: 50 YOM s/p lap appendectomy on 8/27 for ruptured appendix on day #7 of empiric therapy for r/o peritonitis. Vancomycin was added for r/o pneumonia on 8/30 but stopped 9/1. Patient with persistent fever and worsening leukocytosis to restart vancomycin and change Zosyn to Merrem and continue Micafungin. Tmax 101.5. Last vancomycin dose was on 9/1 at 9AM. SCr has improved with CrCl ~99 mL/min. UOP is 0.7 cc/kg/hr last 24 hours.   Zosyn 8/27 >> 9/2 Harvel Quale 9/2 >> Micafungin 9/1 >> Vanc 8/30>>9/1; restart 9/2 >>  8/27: peritoneal cx- polymicrobial, none predominant 8/30: BCx2 >> ngtd 8/27: Peritoneal fluid-anaerobic >> none  Goal of Therapy:  Vancomycin trough level 15-20 mcg/ml  Plan:  1. Merrem 1g IV q8h. D/C Zosyn. 2. Restart Vancomycin at 1g IV q8h.  3. Monitor cultures, clinical status, and check vancomycin trough at Css.   Link Snuffer, PharmD, BCPS Clinical Pharmacist [phone] 01/11/2014,12:35 PM

## 2014-01-12 ENCOUNTER — Inpatient Hospital Stay (HOSPITAL_COMMUNITY): Payer: BC Managed Care – PPO

## 2014-01-12 LAB — PHOSPHORUS: Phosphorus: 2.3 mg/dL (ref 2.3–4.6)

## 2014-01-12 LAB — CBC
HCT: 32.6 % — ABNORMAL LOW (ref 39.0–52.0)
Hemoglobin: 11.2 g/dL — ABNORMAL LOW (ref 13.0–17.0)
MCH: 27.4 pg (ref 26.0–34.0)
MCHC: 34.4 g/dL (ref 30.0–36.0)
MCV: 79.7 fL (ref 78.0–100.0)
PLATELETS: 407 10*3/uL — AB (ref 150–400)
RBC: 4.09 MIL/uL — AB (ref 4.22–5.81)
RDW: 13.6 % (ref 11.5–15.5)
WBC: 19 10*3/uL — AB (ref 4.0–10.5)

## 2014-01-12 LAB — GLUCOSE, CAPILLARY
GLUCOSE-CAPILLARY: 144 mg/dL — AB (ref 70–99)
Glucose-Capillary: 121 mg/dL — ABNORMAL HIGH (ref 70–99)
Glucose-Capillary: 126 mg/dL — ABNORMAL HIGH (ref 70–99)
Glucose-Capillary: 141 mg/dL — ABNORMAL HIGH (ref 70–99)

## 2014-01-12 LAB — MAGNESIUM: Magnesium: 1.7 mg/dL (ref 1.5–2.5)

## 2014-01-12 LAB — COMPREHENSIVE METABOLIC PANEL
ALT: 41 U/L (ref 0–53)
AST: 54 U/L — ABNORMAL HIGH (ref 0–37)
Albumin: 1.6 g/dL — ABNORMAL LOW (ref 3.5–5.2)
Alkaline Phosphatase: 98 U/L (ref 39–117)
Anion gap: 14 (ref 5–15)
BUN: 17 mg/dL (ref 6–23)
CHLORIDE: 100 meq/L (ref 96–112)
CO2: 23 mEq/L (ref 19–32)
CREATININE: 0.77 mg/dL (ref 0.50–1.35)
Calcium: 7.6 mg/dL — ABNORMAL LOW (ref 8.4–10.5)
Glucose, Bld: 122 mg/dL — ABNORMAL HIGH (ref 70–99)
Potassium: 3.1 mEq/L — ABNORMAL LOW (ref 3.7–5.3)
SODIUM: 137 meq/L (ref 137–147)
Total Bilirubin: 1.9 mg/dL — ABNORMAL HIGH (ref 0.3–1.2)
Total Protein: 5.9 g/dL — ABNORMAL LOW (ref 6.0–8.3)

## 2014-01-12 LAB — VANCOMYCIN, TROUGH: Vancomycin Tr: 7.9 ug/mL — ABNORMAL LOW (ref 10.0–20.0)

## 2014-01-12 LAB — TRIGLYCERIDES: Triglycerides: 227 mg/dL — ABNORMAL HIGH (ref <150)

## 2014-01-12 MED ORDER — TRACE MINERALS CR-CU-F-FE-I-MN-MO-SE-ZN IV SOLN
INTRAVENOUS | Status: AC
  Administered 2014-01-12: 17:00:00 via INTRAVENOUS
  Filled 2014-01-12: qty 2000

## 2014-01-12 MED ORDER — LABETALOL HCL 5 MG/ML IV SOLN
10.0000 mg | INTRAVENOUS | Status: DC | PRN
  Administered 2014-01-12: 10 mg via INTRAVENOUS
  Filled 2014-01-12: qty 4

## 2014-01-12 MED ORDER — FENTANYL 25 MCG/HR TD PT72
75.0000 ug | MEDICATED_PATCH | TRANSDERMAL | Status: DC
  Administered 2014-01-12 – 2014-01-18 (×3): 75 ug via TRANSDERMAL
  Filled 2014-01-12: qty 3
  Filled 2014-01-12 (×2): qty 1

## 2014-01-12 MED ORDER — POTASSIUM CHLORIDE 10 MEQ/50ML IV SOLN
10.0000 meq | INTRAVENOUS | Status: AC
  Administered 2014-01-12 (×4): 10 meq via INTRAVENOUS
  Filled 2014-01-12 (×4): qty 50

## 2014-01-12 MED ORDER — LABETALOL HCL 5 MG/ML IV SOLN
10.0000 mg | INTRAVENOUS | Status: DC | PRN
  Administered 2014-01-12 – 2014-01-13 (×5): 20 mg via INTRAVENOUS
  Administered 2014-01-13: 10 mg via INTRAVENOUS
  Administered 2014-01-14: 20 mg via INTRAVENOUS
  Administered 2014-01-17 (×2): 10 mg via INTRAVENOUS
  Administered 2014-01-19: 20 mg via INTRAVENOUS
  Administered 2014-01-20: 10 mg via INTRAVENOUS
  Administered 2014-01-20: 20 mg via INTRAVENOUS
  Filled 2014-01-12 (×15): qty 4

## 2014-01-12 MED ORDER — FENTANYL 75 MCG/HR TD PT72: 75.0000 ug | MEDICATED_PATCH | TRANSDERMAL | Status: DC

## 2014-01-12 MED ORDER — HYDRALAZINE HCL 20 MG/ML IJ SOLN
10.0000 mg | Freq: Four times a day (QID) | INTRAMUSCULAR | Status: DC
  Administered 2014-01-12 – 2014-01-16 (×17): 10 mg via INTRAVENOUS
  Filled 2014-01-12 (×4): qty 0.5
  Filled 2014-01-12: qty 1
  Filled 2014-01-12 (×3): qty 0.5
  Filled 2014-01-12: qty 1
  Filled 2014-01-12: qty 0.5
  Filled 2014-01-12: qty 1
  Filled 2014-01-12: qty 0.5
  Filled 2014-01-12: qty 1
  Filled 2014-01-12 (×3): qty 0.5
  Filled 2014-01-12: qty 1
  Filled 2014-01-12 (×4): qty 0.5

## 2014-01-12 MED ORDER — VANCOMYCIN HCL 10 G IV SOLR
1250.0000 mg | Freq: Four times a day (QID) | INTRAVENOUS | Status: DC
  Administered 2014-01-12 – 2014-01-14 (×7): 1250 mg via INTRAVENOUS
  Filled 2014-01-12 (×10): qty 1250

## 2014-01-12 MED ORDER — FAT EMULSION 20 % IV EMUL
250.0000 mL | INTRAVENOUS | Status: AC
  Administered 2014-01-12: 250 mL via INTRAVENOUS
  Filled 2014-01-12: qty 250

## 2014-01-12 NOTE — Progress Notes (Addendum)
PULMONARY / CRITICAL CARE MEDICINE  Name: Jesus Hunter MRN: [id number] DOB: 24-Mar-1971    ADMISSION DATE:  01/05/2014 CONSULTATION DATE:  01/08/2014  REFERRING MD :  Catha Gosselin  CHIEF COMPLAINT:  Respiratory distress  INITIAL PRESENTATION: 43 yo presented 8/27 with ruptured appendix, taken to surgery for laparoscopic appendectomy and drainage of intra-abd abscess. On 8/30 developed sepsis and respiratory distress requiring intubation.  STUDIES / EVENTS:  8/27  Abdomen CT >>>  Ruptured appendix  8/30  Sepsis / acute respiratory failure >>> intubated 8/31  Abdomen CT >>> Ileus, mildly distended gallbladder without stones, bibasal atelectasis  9/1    Opioids withdrawal suspected 9/2    Extubated   INTERVAL HISTORY:  Remains extubated, uneventful night  VITAL SIGNS: Temp:  [98.8 F (37.1 C)-102.2 F (39 C)] 102.2 F (39 C) (09/03 0436) Pulse Rate:  [80-107] 101 (09/03 0500) Resp:  [20-41] 41 (09/03 0500) BP: (141-184)/(76-108) 141/108 mmHg (09/03 0500) SpO2:  [93 %-99 %] 95 % (09/03 0500) FiO2 (%):  [40 %] 40 % (09/02 0830) Weight:  [78.9 kg (173 lb 15.1 oz)] 78.9 kg (173 lb 15.1 oz) (09/03 0349)  HEMODYNAMICS:   VENTILATOR SETTINGS: Vent Mode:  [-] PSV;CPAP FiO2 (%):  [40 %] 40 % PEEP:  [5 cmH20] 5 cmH20 Pressure Support:  [5 cmH20] 5 cmH20  INTAKE / OUTPUT: Intake/Output     09/02 0701 - 09/03 0700 09/03 0701 - 09/04 0700   I.V. (mL/kg) 382.2 (4.8)    Other     IV Piggyback 700    TPN 2066    Total Intake(mL/kg) 3148.2 (39.9)    Urine (mL/kg/hr) 1825 (1)    Emesis/NG output 600 (0.3)    Drains 95 (0.1)    Stool 2 (0)    Total Output 2522     Net +626.2           PHYSICAL EXAMINATION: General: No distress Neuro:  Awake, alert HEENT:  NGT Cardiovascular:  Regular, no murmurs Lungs:  Bilateral diminished air entry Abdomen:  Abdomen tender with guarding, bowel sounds diminished Musculoskeletal:  Intact  Skin:  Intact   LABS:  CBC  Recent Labs Lab  01/10/14 0525 01/11/14 0300 01/12/14 0400  WBC 14.5* 17.2* 19.0*  HGB 10.8* 10.6* 11.2*  HCT 32.1* 32.5* 32.6*  PLT 240 306 407*   Coag's No results found for this basename: APTT, INR,  in the last 168 hours  BMET  Recent Labs Lab 01/10/14 0525 01/11/14 0300 01/12/14 0400  NA 137 137 137  K 3.9 3.7 3.1*  CL 102 101 100  CO2 BUN 24* 19 17  CREATININE 0.98 0.88 0.77  GLUCOSE 111* 150* 122*   Electrolytes  Recent Labs Lab 01/10/14 0525 01/11/14 0300 01/12/14 0400  CALCIUM 7.1* 7.4* 7.6*  MG 2.3 2.0 1.7  PHOS 2.4 2.7 2.3   Sepsis Markers  Recent Labs Lab 01/08/14 1630  LATICACIDVEN 1.9   ABG  Recent Labs Lab 01/08/14 1608 01/08/14 1825 01/09/14 0350  PHART 7.436 7.362 7.377  PCO2ART 37.4 49.1* 49.8*  PO2ART 59.6* 208.0* 106.0*   Liver Enzymes  Recent Labs Lab 01/09/14 0505 01/10/14 0525 01/12/14 0400  AST 31 39* 54*  ALT 25 30 41  ALKPHOS 46 58 98  BILITOT 1.3* 1.9* 1.9*  ALBUMIN 1.8* 1.5* 1.6*   Cardiac Enzymes  Recent Labs Lab 01/08/14 0938  01/08/14 2240 01/09/14 0505 01/09/14 1000  TROPONINI <0.30  < > <0.30 <0.30 <0.30  PROBNP 334.9*  --   --   --   --   < > =  values in this interval not displayed. Glucose  Recent Labs Lab 01/10/14 1808 01/11/14 0005 01/11/14 0646 01/11/14 1141 01/11/14 1543 01/11/14 2353  GLUCAP 147* 160* 139* 128* 144* 121*   IMAGING:   No results found. ASSESSMENT / PLAN:  PULMONARY A:  Acute hypoxemic respiratory failure in setting of perforated viscus, abdominal surgery and possible opioid withdrawal; extubated 9/2 Bibasilar atelectasis, less likely pneumionia P:   Supplemental oxygen Goal SpO2>92 Incentive spirometry / flutter valve  CARDIOVASCULAR A: HTN P:  Goal BP < 160/90 Add Hydralazine scheduled Add Labetalol PRN  RENAL A:   Hypokalemia P:   Trend BMP K 10 x 4  GASTROINTESTINAL A:   Ruptured appendicitis Ileus GI Px is not required Nutrition P:    NPO NGT to Sx TPN  HEMATOLOGIC A:   VTE Px P:  Heparin [place]  INFECTIOUS A:   Appendicitis, peritonitis, abscess s/p appendectomy and drainage 8/27 Abscess culture 8/27 >>> polymicrobial Possible HCAP WBC is increasing P:   Meropenem 9/2 >>> Vancomycin 8/30 >>> Micafungin 9/1 >>> Follow blood cx 8/30 >>> Re scan abdomen?  ENDOCRINE A: At risk for hyperglycemia with TNA   P:   SSI  NEUROLOGIC A:   Possible opioid use / abuse prior to admission? Possible opioid withdrawal ( high tolerance, fever, agitation / diaphoresis promptly responding to IV opioids ) Post op pain P:   D/c Prexcedex Fentanyl patch Dilaudid PRN  I have personally obtained history, examined patient, evaluated and interpreted laboratory and imaging results, reviewed medical records, formulated assessment / plan and placed orders.  Lonia Farber, MD Pulmonary and Critical Care Medicine [facility] Pager: [phone]  01/12/2014, 7:09 AM

## 2014-01-12 NOTE — Progress Notes (Signed)
PARENTERAL NUTRITION CONSULT NOTE - FOLLOW UP  Pharmacy Consult:  TPN Indication: prolonged ileus  No Known Allergies  Patient Measurements: Height:  (157.5 cm) Weight: 173 lb 15.1 oz (78.9 kg) IBW/kg (Calculated) : 54.6 Adjusted Body Weight: 61.2 kg  Vital Signs: Temp: 97.6 F (36.4 C) (09/03 0809) Temp src: Oral (09/03 0809) BP: 197/89 mmHg (09/03 0800) Pulse Rate: 78 (09/03 0800) Intake/Output from previous day: 09/02 0701 - 09/03 0700 In: 3148.2 [I.V.:382.2; IV Piggyback:700; TPN:2066] Out: 2522 [Urine:1825; Emesis/NG output:600; Drains:95; Stool:2]  Labs:  Recent Labs  01/10/14 0525 01/11/14 0300 01/12/14 0400  WBC 14.5* 17.2* 19.0*  HGB 10.8* 10.6* 11.2*  HCT 32.1* 32.5* 32.6*  PLT 240 306 407*     Recent Labs  01/10/14 0525 01/11/14 0300 01/12/14 0400  NA 137 137 137  K 3.9 3.7 3.1*  CL 102 101 100  CO2 GLUCOSE 111* 150* 122*  BUN 24* 19 17  CREATININE 0.98 0.88 0.77  CALCIUM 7.1* 7.4* 7.6*  MG 2.3 2.0 1.7  PHOS 2.4 2.7 2.3  PROT 5.4*  --  5.9*  ALBUMIN 1.5*  --  1.6*  AST 39*  --  54*  ALT 30  --  41  ALKPHOS 58  --  98  BILITOT 1.9*  --  1.9*  PREALBUMIN 8.1*  --   --   TRIG 235*  --  227*   Estimated Creatinine Clearance: 107.2 ml/min (by C-G formula based on Cr of 0.77).    Recent Labs  01/11/14 1141 01/11/14 1543 01/11/14 2353  GLUCAP 128* 144* 121*     Insulin Requirements in the past 24 hours:  2 units sensitive SSI  Assessment: 42 YOM s/p laparoscopic appendectomy for gangrenous ruptured appendicitis.  Pharmacy managing TPN for prolonged ileus.  Admit: RLQ abd pain GI: POD#7 appendectomy, perforated viscous >> ileus, NGT to LWIS >> O/P 600 ml, prealbumin low at 8.1 Endo: no hx DM, CBGs acceptable Lytes: all WNL Renal: SCr 0.77 (stable) -  UOP 1 ml/kg/hr Pulm: extubated, 2L02 Cards: HLD / HTN - BP elevated, on scheduled hydralazine  Hepatobil: mildly elevated AST, TG elevated at 227, tbili mildly  elevated - ok to continue supplementing trace elements Neuro: Fentanyl patch , GCS 15 ID: vanc, mycamine, zosyn changed to meropenem for appendicitis + peritonitis + abscess s/p drainage 8/27, Afeb, WBC elevated and trending up Best Practices: heparin SQ, MC, PPI IV TPN Access: triple lumen placed 01/08/14 TPN day#: 3 (8/31 >> )  Current Nutrition:  TPN  Nutritional Goals:  2000-2100 kCal and 100-115gm protein daily   Plan:  - Change Clinimix E 5/15 to E 5/20, continue at 83 ml/hr + IVFE at 10 ml/hr.  TPN will provide 2233 kCal and 100gm protein daily, meeting 100% of kCal and 100% of minimal protein needs. - Daily multivitamin and trace elements, watch tbili - Consider d/c'ing SSI/CBG checks if CBGs remain controlled at stable TPN rate - Watch TG and decrease lipid provision as appropriate - F/U AM labs    Talbert Cage PharmD, Pager:  [phone] 01/12/2014, 8:32 AM

## 2014-01-12 NOTE — Progress Notes (Signed)
Patient ID: Jesus Hunter, male   DOB: July 26, 1970, 43 y.o.   MRN: [id number]     [facility]      [address] [place], [place] [address]    Phone: [phone] FAX: [phone]     Subjective: Tachypnic, NAD.  Still febrile, WBC up today.  Non purulent drainage from JP drain.  600 out from NGT, having BMs now.   Objective:  Vital signs:  Filed Vitals:   01/12/14 0400 01/12/14 0436 01/12/14 0500 01/12/14 0755  BP: 168/87  141/108 203/104  Pulse: 89  101   Temp:  102.2 F (39 C)    TempSrc:  Oral    Resp: 29  41   Height:      Weight:      SpO2: 94%  95%     Last BM Date: 01/08/14  Intake/Output   Yesterday:  09/02 0701 - 09/03 0700 In: 3148.2 [I.V.:382.2; IV Piggyback:700; KCL:2751] Out: 2522 [ZGYFV:4944; Emesis/NG output:600; Drains:95; Stool:2] This shift:    I/O last 3 completed shifts: In: 4807.9 [I.V.:875.9; IV Piggyback:750] Out: 9675 [Urine:2775; Emesis/NG output:600; Drains:95; Stool:2]    Physical Exam:  General: Pt awake and alert. Chest: cta. Pulling 1041m on IS.  Tachypnic.  CV: Pulses intact. Regular rhythm.  Abdomen: Soft. distended. Mildly tender at incisions only. RLQ JP drain with serous output. No evidence of peritonitis. No incarcerated hernias.  Ext: SCDs BLE. No mjr edema. No cyanosis  Skin: No petechiae / purpura    Problem List:   Active Problems:   Acute perforated appendicitis   Acute respiratory failure   HTN (hypertension)   Sepsis    Results:   Labs: Results for orders placed during the hospital encounter of 01/05/14 (from the past 48 hour(s))  GLUCOSE, CAPILLARY     Status: Abnormal   Collection Time    01/10/14 11:58 AM      Result Value Ref Range   Glucose-Capillary 110 (*) 70 - 99 mg/dL  GLUCOSE, CAPILLARY     Status: Abnormal   Collection Time    01/10/14  6:08 PM      Result Value Ref Range   Glucose-Capillary 147 (*) 70 - 99 mg/dL  GLUCOSE, CAPILLARY     Status:  Abnormal   Collection Time    01/11/14 12:05 AM      Result Value Ref Range   Glucose-Capillary 160 (*) 70 - 99 mg/dL   Comment 1 Documented in Chart     Comment 2 Notify RN    BASIC METABOLIC PANEL     Status: Abnormal   Collection Time    01/11/14  3:00 AM      Result Value Ref Range   Sodium 137  137 - 147 mEq/L   Potassium 3.7  3.7 - 5.3 mEq/L   Chloride 101  96 - 112 mEq/L   CO2 25  19 - 32 mEq/L   Glucose, Bld 150 (*) 70 - 99 mg/dL   BUN 19  6 - 23 mg/dL   Creatinine, Ser 0.88  0.50 - 1.35 mg/dL   Calcium 7.4 (*) 8.4 - 10.5 mg/dL   GFR calc non Af Amer >90  >90 mL/min   GFR calc Af Amer >90  >90 mL/min   Comment: (NOTE)     The eGFR has been calculated using the CKD EPI equation.     This calculation has not been validated in all clinical situations.     eGFR's persistently <  90 mL/min signify possible Chronic Kidney     Disease.   Anion gap 11  5 - 15  MAGNESIUM     Status: None   Collection Time    01/11/14  3:00 AM      Result Value Ref Range   Magnesium 2.0  1.5 - 2.5 mg/dL  PHOSPHORUS     Status: None   Collection Time    01/11/14  3:00 AM      Result Value Ref Range   Phosphorus 2.7  2.3 - 4.6 mg/dL  CBC     Status: Abnormal   Collection Time    01/11/14  3:00 AM      Result Value Ref Range   WBC 17.2 (*) 4.0 - 10.5 K/uL   RBC 3.86 (*) 4.22 - 5.81 MIL/uL   Hemoglobin 10.6 (*) 13.0 - 17.0 g/dL   HCT 32.5 (*) 39.0 - 52.0 %   MCV 84.2  78.0 - 100.0 fL   MCH 27.5  26.0 - 34.0 pg   MCHC 32.6  30.0 - 36.0 g/dL   RDW 14.4  11.5 - 15.5 %   Platelets 306  150 - 400 K/uL  GLUCOSE, CAPILLARY     Status: Abnormal   Collection Time    01/11/14  6:46 AM      Result Value Ref Range   Glucose-Capillary 139 (*) 70 - 99 mg/dL   Comment 1 Documented in Chart     Comment 2 Notify RN    GLUCOSE, CAPILLARY     Status: Abnormal   Collection Time    01/11/14 11:41 AM      Result Value Ref Range   Glucose-Capillary 128 (*) 70 - 99 mg/dL  GLUCOSE, CAPILLARY     Status:  Abnormal   Collection Time    01/11/14  3:43 PM      Result Value Ref Range   Glucose-Capillary 144 (*) 70 - 99 mg/dL  GLUCOSE, CAPILLARY     Status: Abnormal   Collection Time    01/11/14 11:53 PM      Result Value Ref Range   Glucose-Capillary 121 (*) 70 - 99 mg/dL   Comment 1 Documented in Chart     Comment 2 Notify RN    CBC     Status: Abnormal   Collection Time    01/12/14  4:00 AM      Result Value Ref Range   WBC 19.0 (*) 4.0 - 10.5 K/uL   RBC 4.09 (*) 4.22 - 5.81 MIL/uL   Hemoglobin 11.2 (*) 13.0 - 17.0 g/dL   HCT 32.6 (*) 39.0 - 52.0 %   MCV 79.7  78.0 - 100.0 fL   MCH 27.4  26.0 - 34.0 pg   MCHC 34.4  30.0 - 36.0 g/dL   RDW 13.6  11.5 - 15.5 %   Platelets 407 (*) 150 - 400 K/uL   Comment: REPEATED TO VERIFY  COMPREHENSIVE METABOLIC PANEL     Status: Abnormal   Collection Time    01/12/14  4:00 AM      Result Value Ref Range   Sodium 137  137 - 147 mEq/L   Potassium 3.1 (*) 3.7 - 5.3 mEq/L   Chloride 100  96 - 112 mEq/L   CO2 23  19 - 32 mEq/L   Glucose, Bld 122 (*) 70 - 99 mg/dL   BUN 17  6 - 23 mg/dL   Creatinine, Ser 0.77  0.50 - 1.35 mg/dL  Calcium 7.6 (*) 8.4 - 10.5 mg/dL   Total Protein 5.9 (*) 6.0 - 8.3 g/dL   Albumin 1.6 (*) 3.5 - 5.2 g/dL   AST 54 (*) 0 - 37 U/L   ALT 41  0 - 53 U/L   Alkaline Phosphatase 98  39 - 117 U/L   Total Bilirubin 1.9 (*) 0.3 - 1.2 mg/dL   GFR calc non Af Amer >90  >90 mL/min   GFR calc Af Amer >90  >90 mL/min   Comment: (NOTE)     The eGFR has been calculated using the CKD EPI equation.     This calculation has not been validated in all clinical situations.     eGFR's persistently <90 mL/min signify possible Chronic Kidney     Disease.   Anion gap 14  5 - 15  MAGNESIUM     Status: None   Collection Time    01/12/14  4:00 AM      Result Value Ref Range   Magnesium 1.7  1.5 - 2.5 mg/dL  PHOSPHORUS     Status: None   Collection Time    01/12/14  4:00 AM      Result Value Ref Range   Phosphorus 2.3  2.3 - 4.6  mg/dL  TRIGLYCERIDES     Status: Abnormal   Collection Time    01/12/14  4:00 AM      Result Value Ref Range   Triglycerides 227 (*) <150 mg/dL    Imaging / Studies: Dg Abd 1 View  01/10/2014   CLINICAL DATA:  Nasogastric tube placement.  EXAM: ABDOMEN - 1 VIEW  COMPARISON:  Abdominal CT from yesterday  FINDINGS: Gastric suction tube tip is at the level of the pylorus. Persistent dilatation of small bowel, measuring up to 7 cm in diameter. Previously administered oral contrast has progressed distally, visible currently in the descending colon. Surgical drain again noted in the right lower quadrant.  IMPRESSION: 1. Nasogastric tube tip at the pylorus. 2. Unchanged small bowel dilatation.   Electronically Signed   By: Jorje Guild M.D.   On: 01/10/2014 13:38    Medications / Allergies:  Scheduled Meds: . antiseptic oral rinse  7 mL Mouth Rinse QID  . chlorhexidine  15 mL Mouth Rinse BID  . fentaNYL  50 mcg Transdermal Q72H  . heparin subcutaneous  5,000 Units Subcutaneous 3 times per day  . hydrALAZINE  10 mg Intravenous Q6H  . insulin aspart  0-9 Units Subcutaneous 4 times per day  . meropenem (MERREM) IV  1 g Intravenous 3 times per day  . micafungin [facility]) IV  100 mg Intravenous Daily  . potassium chloride  10 mEq Intravenous Q1 Hr x 4  . vancomycin  1,000 mg Intravenous Q8H   Continuous Infusions: . Marland KitchenTPN (CLINIMIX-E) Adult 83 mL/hr at 01/11/14 1729   And  . fat emulsion 250 mL (01/11/14 1729)  . sodium chloride 10 mL/hr (01/10/14 0800)   PRN Meds:.acetaminophen (TYLENOL) oral liquid 160 mg/5 mL, acetaminophen, acetaminophen, albuterol, HYDROmorphone (DILAUDID) injection, labetalol, ondansetron (ZOFRAN) IV  Antibiotics: Anti-infectives   Start     Dose/Rate Route Frequency Ordered Stop   01/11/14 1400  meropenem (MERREM) 1 g in sodium chloride 0.9 % 100 mL IVPB     1 g 200 mL/hr over 30 Minutes Intravenous 3 times per day 01/11/14 1138     01/11/14 1300  vancomycin  (VANCOCIN) IVPB 1000 mg/200 mL premix     1,000 mg 200 mL/hr over  60 Minutes Intravenous Every 8 hours 01/11/14 1233     01/10/14 1500  micafungin (MYCAMINE) 100 mg in sodium chloride 0.9 % 100 mL IVPB     100 mg 100 mL/hr over 1 Hours Intravenous Daily 01/10/14 1405     01/08/14 2200  vancomycin (VANCOCIN) IVPB 1000 mg/200 mL premix  Status:  Discontinued     1,000 mg 200 mL/hr over 60 Minutes Intravenous Every 12 hours 01/08/14 0913 01/10/14 1417   01/08/14 0945  vancomycin (VANCOCIN) 2,000 mg in sodium chloride 0.9 % 500 mL IVPB     2,000 mg 250 mL/hr over 120 Minutes Intravenous  Once 01/08/14 0913 01/08/14 1206   01/05/14 2200  piperacillin-tazobactam (ZOSYN) IVPB 3.375 g  Status:  Discontinued     3.375 g 12.5 mL/hr over 240 Minutes Intravenous 3 times per day 01/05/14 1847 01/11/14 1138   01/05/14 1345  piperacillin-tazobactam (ZOSYN) IVPB 3.375 g     3.375 g 100 mL/hr over 30 Minutes Intravenous  Once 01/05/14 1343 01/05/14 1445       Assessment/Plan Gangrenous Ruptured Appendicitis---Dr. Hulen Skains 01/05/14  PCM  POD#7  -continue with IV antibiotics  ileus - NGT to LWIS for another day, will consider clamping trial tomorrow -SCD/lovenox  -continue foley for I&O  -drain care(serous)  -cultures-multiple organisms present  -zosyn 8/27-9/2.  Vanc 8/30--->  Micafungin 9/1--->  Meropenem 9/2---> -CT of abdomen/pelvis negative for abscess(8/31) shows bibasilar PNA -too early to repeat CT scan at this point -TPN  VDRF  -appreciate CCM management  -Vanc for PNA  AKI-stable HTN-hydralazine scheduled, PRN labetalol  Dispo-continue ICU today  Erby Pian, [facility] Pager [phone](7A-4:30P) For consults and floor pages call [phone](7A-4:30P)  01/12/2014 8:06 AM

## 2014-01-12 NOTE — Progress Notes (Signed)
ANTIBIOTIC CONSULT NOTE - FOLLOW UP  Pharmacy Consult for Restart Vancomycin, Merrem Indication: Persistent Fever, Peritonitis vs r/o PNA  No Known Allergies  Patient Measurements: Height:  (157.5 cm) Weight: 173 lb 15.1 oz (78.9 kg) IBW/kg (Calculated) : 54.6 Vital Signs: Temp: 97.6 F (36.4 C) (09/03 0809) Temp src: Oral (09/03 0809) BP: 178/89 mmHg (09/03 1100) Pulse Rate: 88 (09/03 1100) Intake/Output from previous day: 09/02 0701 - 09/03 0700 In: 3158.2 [I.V.:392.2; IV Piggyback:700; TPN:2066] Out: 2522 [Urine:1825; Emesis/NG output:600; Drains:95; Stool:2] Intake/Output from this shift: Total I/O In: 745 [I.V.:30; IV Piggyback:250; TPN:465] Out: 475 [Urine:425; Drains:50] Labs:  Recent Labs  01/10/14 0525 01/11/14 0300 01/12/14 0400  WBC 14.5* 17.2* 19.0*  HGB 10.8* 10.6* 11.2*  PLT 240 306 407*  CREATININE 0.98 0.88 0.77   Estimated Creatinine Clearance: 107.2 ml/min (by C-G formula based on Cr of 0.77).  Recent Labs  01/12/14 1205  VANCOTROUGH 7.9*     Microbiology: Recent Results (from the past 720 hour(s))  BODY FLUID CULTURE     Status: None   Collection Time    01/05/14  4:27 PM      Result Value Ref Range Status   Specimen Description PERITONEAL FLUID   Final   Special Requests PATIENT ON FOLLOWING ZOSYN RECEIVED SWAB   Final   Gram Stain     Final   Value: NO WBC SEEN     ABUNDANT GRAM NEGATIVE RODS     ABUNDANT GRAM POSITIVE RODS     MODERATE GRAM POSITIVE COCCI     IN PAIRS Gram Stain Report Called to,Read Back By and Verified With: Gram Stain Report Called to,Read Back By and Verified With: Purcell Nails RN 01/06/14 0740AM BY MILSH   Culture     Final   Value: MULTIPLE ORGANISMS PRESENT, NONE PREDOMINANT     Performed at [facility]   Report Status 01/07/2014 FINAL   Final  ANAEROBIC CULTURE     Status: None   Collection Time    01/05/14  4:27 PM      Result Value Ref Range Status   Specimen Description PERITONEAL FLUID    Final   Special Requests PATIENT ON FOLLOWING ZOSYN RECEIVED SWAB   Final   Gram Stain     Final   Value: NO WBC SEEN     ABUNDANT GRAM NEGATIVE RODS     ABUNDANT GRAM POSITIVE RODS     MODERATE GRAM POSITIVE COCCI     IN PAIRS   Culture     Final   Value: NO ANAEROBES ISOLATED     Performed at [facility]   Report Status 01/10/2014 FINAL   Final  CULTURE, BLOOD (ROUTINE X 2)     Status: None   Collection Time    01/08/14 12:45 PM      Result Value Ref Range Status   Specimen Description BLOOD LEFT ARM   Final   Special Requests BOTTLES DRAWN AEROBIC ONLY 5CC   Final   Culture  Setup Time     Final   Value: 01/08/2014 18:43     Performed at [facility]   Culture     Final   Value:        BLOOD CULTURE RECEIVED NO GROWTH TO DATE CULTURE WILL BE HELD FOR 5 DAYS BEFORE ISSUING A FINAL NEGATIVE REPORT     Performed at [facility]   Report Status PENDING   Incomplete  CULTURE, BLOOD (ROUTINE X  2)     Status: None   Collection Time    01/08/14  1:00 PM      Result Value Ref Range Status   Specimen Description BLOOD LEFT HAND   Final   Special Requests BOTTLES DRAWN AEROBIC ONLY 8CC   Final   Culture  Setup Time     Final   Value: 01/08/2014 18:44     Performed at [facility]   Culture     Final   Value:        BLOOD CULTURE RECEIVED NO GROWTH TO DATE CULTURE WILL BE HELD FOR 5 DAYS BEFORE ISSUING A FINAL NEGATIVE REPORT     Performed at [facility]   Report Status PENDING   Incomplete  MRSA PCR SCREENING     Status: None   Collection Time    01/08/14  5:23 PM      Result Value Ref Range Status   MRSA by PCR NEGATIVE  NEGATIVE Final   Comment:            The GeneXpert MRSA Assay (FDA     approved for NASAL specimens     only), is one component of a     comprehensive MRSA colonization     surveillance program. It is not     intended to diagnose MRSA     infection nor to guide or     monitor treatment for     MRSA infections.    Assessment: Jesus Hunter s/p lap appendectomy on 8/27 for ruptured appendix on day #8 of empiric therapy for r/o peritonitis. Patient with persistent fever and worsening leukocytosis was restarted vancomycin and change Zosyn to Merrem and continue Micafungin on 01/11/14 with concern for abscess. Tmax 102.2. SCr has improved with CrCl >100 mL/min. UOP is 1 cc/kg/hr last 24 hours.   Vancomycin trough prior to 4th dose = 7.9 on 1g IV q8h. Drawn 1hr prior to next dose and prior dose given on time.   Zosyn 8/27 >> 9/2 Harvel Quale 9/2 >> Micafungin 9/1 >> Vanc 8/30>>9/1; restart 9/2 >>  8/27: peritoneal cx- polymicrobial, none predominant 8/30: BCx2 >> ngtd 8/27: Peritoneal fluid-anaerobic >> none  Goal of Therapy:  Vancomycin trough level 15-20 mcg/ml  Plan:  1. Continue Merrem 1g IV q8h.  2. Increase vancomycin to 1250 mg IV q6h.  3. Monitor cultures, clinical status, and renal function. 4. Re-check vancomycin trough tomorrow prior to 8AM dose.   Link Snuffer, PharmD, BCPS Clinical Pharmacist [phone] 01/12/2014,1:01 PM

## 2014-01-12 NOTE — Progress Notes (Signed)
STILL SEEMS TO HAVE PULMONARY ISSUES.  CT JUST DONE AND SHOWED NOTHING WORRISOME.  CONCERN WOULD BE ANOTHER BLAST OF CONTRAST WITH MINIMAL BENEFIT. FAVOR RESOLVING PULMONARY ISSUE AS ETIOLOGY FOR ISSUES.

## 2014-01-12 NOTE — Progress Notes (Signed)
Acute PCCM evaluation.  S: Pt with SIRS criteria.  Had recent appendectomy, and extubated 9/02.  Also ?of opiate withdrawal syndrome.  O: Temp:  [97.6 F (36.4 C)-102.7 F (39.3 C)] 102.7 F (39.3 C) (09/03 2232) Pulse Rate:  [77-114] 114 (09/03 2200) Resp:  [26-41] 27 (09/03 2200) BP: (141-203)/(81-108) 169/97 mmHg (09/03 2200) SpO2:  [91 %-98 %] 92 % (09/03 2200) Weight:  [173 lb 15.1 oz (78.9 kg)] 173 lb 15.1 oz (78.9 kg) (09/03 0349)  I/O last 3 completed shifts: In: 4858.2 [I.V.:512.2; IV Piggyback:1350] Out: 4502 [Urine:2875; Emesis/NG output:1350; Drains:275; Stool:2]  General: diaphoretic Neuro: alert, normal strength Cardiac: regular, tachycardic Chest: scattered rhonchi Abd: soft, non tender, wound sites clean, + bowel sounds Ext: 1+ edema Skin: Lt IJ CVL site clean  CBC Recent Labs     01/10/14  0525  01/11/14  0300  01/12/14  0400  WBC  14.5*  17.2*  19.0*  HGB  10.8*  10.6*  11.2*  HCT  32.1*  32.5*  32.6*  PLT  240  306  407*   BMET Recent Labs     01/10/14  0525  01/11/14  0300  01/12/14  0400  NA  137  137  137  K  3.9  3.7  3.1*  CL  102  101  100  CO2  BUN  24*  19  17  CREATININE  0.98  0.88  0.77  GLUCOSE  111*  150*  122*    Electrolytes Recent Labs     01/10/14  0525  01/11/14  0300  01/12/14  0400  CALCIUM  7.1*  7.4*  7.6*  MG  2.3  2.0  1.7  PHOS  2.4  2.7  2.3   Liver Enzymes Recent Labs     01/10/14  0525  01/12/14  0400  AST  39*  54*  ALT  30  41  ALKPHOS  58  98  BILITOT  1.9*  1.9*  ALBUMIN  1.5*  1.6*    Glucose Recent Labs     01/11/14  1141  01/11/14  1543  01/11/14  2353  01/12/14  0817  01/12/14  1255  01/12/14  1908  GLUCAP  128*  144*  121*  141*  144*  126*    Imaging No results found.   Impression:  SIRS/sepsis >> PNA vs intra-abdominal. Plan: Continue current Abx, antifungal Check CXR, Abd Xray Might need CT abd/pelvis Check lactic acid, procalcitonin  Possible  opiate withdrawal syndrome. Plan: Continue prn dilaudid  CC time 45 minutes.  Coralyn Helling, MD [facility] Pulmonary/Critical Care 01/12/2014, 11:34 PM Pager:  [phone] After 3pm call: [phone]

## 2014-01-12 NOTE — Progress Notes (Addendum)
Informed V. Sood, MD of patients continuous tachycardia, recurrent temperatures, hypertension, increasingly firm abdomen with recurent pain and clamminess throughout day shift and continued into night shift. CT of abdomen suggested, although this was discussed by day time CCM MD and Surgical MD. Awaiting further orders at this time.

## 2014-01-12 NOTE — Progress Notes (Signed)
eLink Nursing ICU Electrolyte Replacement Protocol  Patient Name: Jesus Hunter DOB: Jul 03, 1970 MRN: [id number]  Date of Service  01/12/2014   HPI/Events of Note    Recent Labs Lab 01/08/14 1615 01/09/14 0505 01/10/14 0525 01/11/14 0300 01/12/14 0400  NA 134* 138 137 137 137  K 3.8 3.7 3.9 3.7 3.1*  CL 95* 100 102 101 100  CO2 GLUCOSE 131* 98 111* 150* 122*  BUN 25* 25* 24* 19 17  CREATININE 0.92 1.09 0.98 0.88 0.77  CALCIUM 8.4 7.6* 7.1* 7.4* 7.6*  MG 2.2 2.5 2.3 2.0 1.7  PHOS 1.6* 1.7* 2.4 2.7 2.3    Estimated Creatinine Clearance: 107.2 ml/min (by C-G formula based on Cr of 0.77).  Intake/Output     09/02 0701 - 09/03 0700   I.V. (mL/kg) 382.2 (4.8)   IV Piggyback 700   TPN 2066   Total Intake(mL/kg) 3148.2 (39.9)   Urine (mL/kg/hr) 1825 (1)   Drains 55 (0)   Stool 2 (0)   Total Output 1882   Net +1266.2        - I/O DETAILED x24h    Total I/O In: 1583 [I.V.:110; IV Piggyback:450; TPN:1023] Out: 401 [Urine:400; Stool:1] - I/O THIS SHIFT    ASSESSMENT   eICURN Interventions  K+ 3.1 This is an elink summary and not part of the permanent medical record. Do not Print!   ASSESSMENT: MAJOR ELECTROLYTE    Merita Norton 01/12/2014, 6:23 AM

## 2014-01-13 ENCOUNTER — Inpatient Hospital Stay (HOSPITAL_COMMUNITY): Payer: BC Managed Care – PPO

## 2014-01-13 LAB — BASIC METABOLIC PANEL
Anion gap: 14 (ref 5–15)
BUN: 16 mg/dL (ref 6–23)
CO2: 20 mEq/L (ref 19–32)
Calcium: 7.3 mg/dL — ABNORMAL LOW (ref 8.4–10.5)
Chloride: 102 mEq/L (ref 96–112)
Creatinine, Ser: 0.77 mg/dL (ref 0.50–1.35)
GFR calc Af Amer: >90 mL/min (ref >90)
Glucose, Bld: 121 mg/dL — ABNORMAL HIGH (ref 70–99)
Potassium: 3.6 mEq/L — ABNORMAL LOW (ref 3.7–5.3)
Sodium: 136 mEq/L — ABNORMAL LOW (ref 137–147)

## 2014-01-13 LAB — CBC
HCT: 35.2 % — ABNORMAL LOW (ref 39.0–52.0)
Hemoglobin: 12 g/dL — ABNORMAL LOW (ref 13.0–17.0)
MCH: 27.2 pg (ref 26.0–34.0)
MCHC: 34.1 g/dL (ref 30.0–36.0)
MCV: 79.8 fL (ref 78.0–100.0)
Platelets: 487 10*3/uL — ABNORMAL HIGH (ref 150–400)
RBC: 4.41 MIL/uL (ref 4.22–5.81)
RDW: 13.7 % (ref 11.5–15.5)
WBC: 24.1 10*3/uL — AB (ref 4.0–10.5)

## 2014-01-13 LAB — URINALYSIS, ROUTINE W REFLEX MICROSCOPIC
GLUCOSE, UA: 100 mg/dL — AB
Hgb urine dipstick: NEGATIVE
KETONES UR: NEGATIVE mg/dL
LEUKOCYTES UA: NEGATIVE
NITRITE: NEGATIVE
Protein, ur: 30 mg/dL — AB
Specific Gravity, Urine: 1.02 (ref 1.005–1.030)
Urobilinogen, UA: 1 mg/dL (ref 0.0–1.0)
pH: 6.5 (ref 5.0–8.0)

## 2014-01-13 LAB — MAGNESIUM: Magnesium: 1.8 mg/dL (ref 1.5–2.5)

## 2014-01-13 LAB — URINE MICROSCOPIC-ADD ON

## 2014-01-13 LAB — GLUCOSE, CAPILLARY
GLUCOSE-CAPILLARY: 148 mg/dL — AB (ref 70–99)
Glucose-Capillary: 137 mg/dL — ABNORMAL HIGH (ref 70–99)
Glucose-Capillary: 142 mg/dL — ABNORMAL HIGH (ref 70–99)
Glucose-Capillary: 144 mg/dL — ABNORMAL HIGH (ref 70–99)

## 2014-01-13 LAB — LACTIC ACID, PLASMA: Lactic Acid, Venous: 1.5 mmol/L (ref 0.5–2.2)

## 2014-01-13 LAB — PROCALCITONIN
PROCALCITONIN: 2.37 ng/mL
Procalcitonin: 2.25 ng/mL

## 2014-01-13 LAB — PHOSPHORUS: Phosphorus: 2.8 mg/dL (ref 2.3–4.6)

## 2014-01-13 LAB — VANCOMYCIN, TROUGH: Vancomycin Tr: 21.5 ug/mL — ABNORMAL HIGH (ref 10.0–20.0)

## 2014-01-13 MED ORDER — IOHEXOL 300 MG/ML  SOLN
80.0000 mL | Freq: Once | INTRAMUSCULAR | Status: AC | PRN
  Administered 2014-01-13: 80 mL via INTRAVENOUS

## 2014-01-13 MED ORDER — TRACE MINERALS CR-CU-F-FE-I-MN-MO-SE-ZN IV SOLN
INTRAVENOUS | Status: AC
  Administered 2014-01-13: 17:00:00 via INTRAVENOUS
  Filled 2014-01-13: qty 2000

## 2014-01-13 MED ORDER — IOHEXOL 300 MG/ML  SOLN
25.0000 mL | INTRAMUSCULAR | Status: AC
  Administered 2014-01-13 (×2): 25 mL via ORAL

## 2014-01-13 MED ORDER — FAT EMULSION 20 % IV EMUL
250.0000 mL | INTRAVENOUS | Status: AC
  Administered 2014-01-13: 250 mL via INTRAVENOUS
  Filled 2014-01-13: qty 250

## 2014-01-13 MED ORDER — POTASSIUM CHLORIDE 10 MEQ/50ML IV SOLN
10.0000 meq | INTRAVENOUS | Status: AC
  Administered 2014-01-13 (×3): 10 meq via INTRAVENOUS
  Filled 2014-01-13 (×3): qty 50

## 2014-01-13 NOTE — Progress Notes (Signed)
Will repeat CT but CXR looks worse to me.  Will scan chest abdomen and pelvis.  Abdomen feels benign but given clinical picture will look for source of WBC elevation.

## 2014-01-13 NOTE — Progress Notes (Signed)
Patient ID: Jesus Hunter, male   DOB: 05/23/1970, 43 y.o.   MRN: [id number]  CT of abdomen and pelvis reviewed.  Fluid collections are unimpressive, doubt ischemic bowel.  Will continue with NGT, bowel rest and IV antibiotics.  CT of chest with bibasilar PNA.  Appreciate CCM assistance.  Hopefully, will improve with broad spectrum atbx.  Quintavius Niebuhr, ANP-BC

## 2014-01-13 NOTE — Progress Notes (Signed)
PULMONARY / CRITICAL CARE MEDICINE  Name: Jesus Hunter MRN: [id number] DOB: 10/20/1970    ADMISSION DATE:  01/05/2014 CONSULTATION DATE:  01/08/2014  REFERRING MD :  Catha Gosselin  CHIEF COMPLAINT:  Respiratory distress  INITIAL PRESENTATION: 43 yo presented 8/27 with ruptured appendix, taken to surgery for laparoscopic appendectomy and drainage of intra-abd abscess. On 8/30 developed sepsis and respiratory distress requiring intubation.  STUDIES / EVENTS:  8/27  Abdomen CT >>>  Ruptured appendix  8/30  Sepsis / acute respiratory failure >>> intubated 8/31  Abdomen CT >>> Ileus, mildly distended gallbladder without stones, bibasal atelectasis  9/1    Opioids withdrawal suspected 9/2    Extubated   INTERVAL HISTORY:  SIRS picture overnight  VITAL SIGNS: Temp:  [98.7 F (37.1 C)-102.7 F (39.3 C)] 99.9 F (37.7 C) (09/04 0752) Pulse Rate:  [83-142] 122 (09/04 0600) Resp:  [20-41] 24 (09/04 0600) BP: (147-190)/(84-107) 147/85 mmHg (09/04 0500) SpO2:  [91 %-96 %] 94 % (09/04 0600) Weight:  [76.6 kg (168 lb 14 oz)] 76.6 kg (168 lb 14 oz) (09/04 0406)  HEMODYNAMICS:   VENTILATOR SETTINGS:   INTAKE / OUTPUT: Intake/Output     09/03 0701 - 09/04 0700 09/04 0701 - 09/05 0700   I.V. (mL/kg) 240 (3.1) 10 (0.1)   IV Piggyback 1350    TPN 2046 93   Total Intake(mL/kg) 3636 (47.5) 103 (1.3)   Urine (mL/kg/hr) 2050 (1.1) 100 (0.7)   Emesis/NG output 1450 (0.8)    Drains 480 (0.3)    Stool     Total Output 3980 100   Net -344 +3         PHYSICAL EXAMINATION: General: Looks uncomfortable Neuro:  Awake, alert HEENT:  NGT Cardiovascular:  Regular, no murmurs Lungs:  Bilateral diminished air entry, rales Abdomen:  Abdomen tender with guarding, bowel sounds diminished Musculoskeletal:  Intact  Skin:  Intact   LABS:  CBC  Recent Labs Lab 01/11/14 0300 01/12/14 0400 01/13/14 0415  WBC 17.2* 19.0* 24.1*  HGB 10.6* 11.2* 12.0*  HCT 32.5* 32.6* 35.2*  PLT 306 407* 487*    Coag's No results found for this basename: APTT, INR,  in the last 168 hours  BMET  Recent Labs Lab 01/11/14 0300 01/12/14 0400 01/13/14 0415  NA 137 137 136*  K 3.7 3.1* 3.6*  CL 101 100 102  CO2 BUN CREATININE 0.88 0.77 0.77  GLUCOSE 150* 122* 121*   Electrolytes  Recent Labs Lab 01/11/14 0300 01/12/14 0400 01/13/14 0415  CALCIUM 7.4* 7.6* 7.3*  MG 2.0 1.7 1.8  PHOS 2.7 2.3 2.8   Sepsis Markers  Recent Labs Lab 01/08/14 1630 01/12/14 2331 01/12/14 2359 01/13/14 0415  LATICACIDVEN 1.9  --  1.5  --   PROCALCITON  --  2.37  --  2.25   ABG  Recent Labs Lab 01/08/14 1608 01/08/14 1825 01/09/14 0350  PHART 7.436 7.362 7.377  PCO2ART 37.4 49.1* 49.8*  PO2ART 59.6* 208.0* 106.0*   Liver Enzymes  Recent Labs Lab 01/09/14 0505 01/10/14 0525 01/12/14 0400  AST 31 39* 54*  ALT 25 30 41  ALKPHOS 46 58 98  BILITOT 1.3* 1.9* 1.9*  ALBUMIN 1.8* 1.5* 1.6*   Cardiac Enzymes  Recent Labs Lab 01/08/14 0938  01/08/14 2240 01/09/14 0505 01/09/14 1000  TROPONINI <0.30  < > <0.30 <0.30 <0.30  PROBNP 334.9*  --   --   --   --   < > = values  in this interval not displayed. Glucose  Recent Labs Lab 01/11/14 2353 01/12/14 0817 01/12/14 1255 01/12/14 1908 01/13/14 0028 01/13/14 0554  GLUCAP 121* 141* 144* 126* 144* 148*   IMAGING:   No results found.  ASSESSMENT / PLAN:  PULMONARY A:  Acute hypoxemic respiratory failure in setting of perforated viscus, abdominal surgery and possible opioid withdrawal; extubated 9/2 Possible HCAP P:   Supplemental oxygen Goal SpO2>92 Incentive spirometry / flutter valve  CARDIOVASCULAR A: HTN SIRS / sepsis P:  Goal BP < 160/90 Hydralazine Labetalol PRN  RENAL A:   Hypokalemia P:   Trend BMP K 10 x 4  GASTROINTESTINAL A:   Ruptured appendicitis Ileus GI Px is not required Nutrition P:   NPO NGT to Sx TPN  HEMATOLOGIC A:   VTE Px P:  Heparin  [employer]  INFECTIOUS A:   Appendicitis, peritonitis, abscess s/p appendectomy and drainage 8/27 Abscess culture 8/27 >>> polymicrobial Possible HCAP WBC is increasing and SIRS / sepsis picture P:   Meropenem 9/2 >>> Vancomycin 8/30 >>> Micafungin 9/1 >>> Follow blood cx 8/30 >>> CT chest / abdomen / pelvis  ENDOCRINE A: At risk for hyperglycemia with TNA   P:   SSI  NEUROLOGIC A:   Possible opioid use / abuse prior to admission? Possible opioid withdrawal ( high tolerance, fever, agitation / diaphoresis promptly responding to IV opioids ) Post op pain P:   Fentanyl patch Dilaudid PRN  I have personally obtained history, examined patient, evaluated and interpreted laboratory and imaging results, reviewed medical records, formulated assessment / plan and placed orders.  CRITICAL CARE:  The patient is critically ill with multiple organ systems failure and requires high complexity decision making for assessment and support, frequent evaluation and titration of therapies, application of advanced monitoring technologies and extensive interpretation of multiple databases. Critical Care Time devoted to patient care services described in this note is 35 minutes.   Lonia Farber, MD Pulmonary and Critical Care Medicine [facility] Pager: [phone]  01/13/2014, 9:10 AM

## 2014-01-13 NOTE — Progress Notes (Addendum)
ANTIBIOTIC CONSULT NOTE - FOLLOW UP  Pharmacy Consult for Restart Vancomycin, Merrem Indication: Persistent Fever, Peritonitis vs r/o PNA  No Known Allergies  Patient Measurements: Height:  (157.5 cm) Weight: 168 lb 14 oz (76.6 kg) IBW/kg (Calculated) : 54.6 Vital Signs: Temp: 99.9 F (37.7 C) (09/04 0752) Temp src: Oral (09/04 0752) BP: 147/85 mmHg (09/04 0500) Pulse Rate: 122 (09/04 0600) Intake/Output from previous day: 09/03 0701 - 09/04 0700 In: 3533 [I.V.:230; IV Piggyback:1350; TPN:1953] Out: 3980 [Urine:2050; Emesis/NG output:1450; Drains:480] Intake/Output from this shift:   Labs:  Recent Labs  01/11/14 0300 01/12/14 0400 01/13/14 0415  WBC 17.2* 19.0* 24.1*  HGB 10.6* 11.2* 12.0*  PLT 306 407* 487*  CREATININE 0.88 0.77 0.77   Estimated Creatinine Clearance: 105.7 ml/min (by C-G formula based on Cr of 0.77).  Recent Labs  01/12/14 1205 01/13/14 0730  VANCOTROUGH 7.9* 21.5*    Microbiology: Recent Results (from the past 720 hour(s))  BODY FLUID CULTURE     Status: None   Collection Time    01/05/14  4:27 PM      Result Value Ref Range Status   Specimen Description PERITONEAL FLUID   Final   Special Requests PATIENT ON FOLLOWING ZOSYN RECEIVED SWAB   Final   Gram Stain     Final   Value: NO WBC SEEN     ABUNDANT GRAM NEGATIVE RODS     ABUNDANT GRAM POSITIVE RODS     MODERATE GRAM POSITIVE COCCI     IN PAIRS Gram Stain Report Called to,Read Back By and Verified With: Gram Stain Report Called to,Read Back By and Verified With: Purcell Nails RN 01/06/14 0740AM BY MILSH   Culture     Final   Value: MULTIPLE ORGANISMS PRESENT, NONE PREDOMINANT     Performed at [facility]   Report Status 01/07/2014 FINAL   Final  ANAEROBIC CULTURE     Status: None   Collection Time    01/05/14  4:27 PM      Result Value Ref Range Status   Specimen Description PERITONEAL FLUID   Final   Special Requests PATIENT ON FOLLOWING ZOSYN RECEIVED SWAB   Final   Gram Stain     Final   Value: NO WBC SEEN     ABUNDANT GRAM NEGATIVE RODS     ABUNDANT GRAM POSITIVE RODS     MODERATE GRAM POSITIVE COCCI     IN PAIRS   Culture     Final   Value: NO ANAEROBES ISOLATED     Performed at [facility]   Report Status 01/10/2014 FINAL   Final  CULTURE, BLOOD (ROUTINE X 2)     Status: None   Collection Time    01/08/14 12:45 PM      Result Value Ref Range Status   Specimen Description BLOOD LEFT ARM   Final   Special Requests BOTTLES DRAWN AEROBIC ONLY 5CC   Final   Culture  Setup Time     Final   Value: 01/08/2014 18:43     Performed at [facility]   Culture     Final   Value:        BLOOD CULTURE RECEIVED NO GROWTH TO DATE CULTURE WILL BE HELD FOR 5 DAYS BEFORE ISSUING A FINAL NEGATIVE REPORT     Performed at [facility]   Report Status PENDING   Incomplete  CULTURE, BLOOD (ROUTINE X 2)     Status: None   Collection  Time    01/08/14  1:00 PM      Result Value Ref Range Status   Specimen Description BLOOD LEFT HAND   Final   Special Requests BOTTLES DRAWN AEROBIC ONLY 8CC   Final   Culture  Setup Time     Final   Value: 01/08/2014 18:44     Performed at [facility]   Culture     Final   Value:        BLOOD CULTURE RECEIVED NO GROWTH TO DATE CULTURE WILL BE HELD FOR 5 DAYS BEFORE ISSUING A FINAL NEGATIVE REPORT     Performed at [facility]   Report Status PENDING   Incomplete  MRSA PCR SCREENING     Status: None   Collection Time    01/08/14  5:23 PM      Result Value Ref Range Status   MRSA by PCR NEGATIVE  NEGATIVE Final   Comment:            The GeneXpert MRSA Assay (FDA     approved for NASAL specimens     only), is one component of a     comprehensive MRSA colonization     surveillance program. It is not     intended to diagnose MRSA     infection nor to guide or     monitor treatment for     MRSA infections.   Assessment: 19 YOM s/p lap appendectomy on 8/27 for ruptured appendix on day  #9 of empiric therapy for r/o peritonitis. Patient with persistent fever and worsening leukocytosis (24.1), restarted on vancomycin, Merrem and continued on Micafungin on 01/11/14 with concern for abscess. Tmax 102.7 last night and 99.9 this morning.  He continues to have good UOP at 1.64ml/kg/hr in the last 24 hours.  Given ongoing leukocytosis, will continue current regimen for now but re-evaluate for accumulation after 3rd subsequent dose.  He continues to have some tachycardia with rates of 122.  Vancomycin trough reported as 21.56mcg, this was drawn slightly early and I suspect he is clearing appropriately.   Zosyn 8/27 >> 9/2 Harvel Quale 9/2 >> Micafungin 9/1 >> Vanc 8/30>>9/1; restart 9/2 >>  8/27: peritoneal cx- polymicrobial, none predominant 8/30: BCx2 >> ngtd 8/27: Peritoneal fluid-anaerobic >> none  Goal of Therapy:  Vancomycin trough level 15-20 mcg/ml  Plan:  1. Continue Merrem 1g IV q8h.  2. Continue Vancomycin to 1250 mg IV q6h.  3. Monitor cultures, clinical status, and renal function. 4. F/U Vancomycin trough in 72 hr.  Nadara Mustard, PharmD., MS Clinical Pharmacist Pager:  [phone] Thank you for allowing pharmacy to be part of this patients care team. 01/13/2014,8:45 AM

## 2014-01-13 NOTE — Progress Notes (Signed)
CT  Shows pulmonary infiltrates.  Some small intraabdominal fluid collection but none enhance. Fluid appears acsites.  Bowel reactive appearance noted. Unlikely ischemic more likely reactive. Question the need for bronchoscopy at some point.  Abdomen benign on exam and contract seems to go through to rectum but some of that may be old.

## 2014-01-13 NOTE — Progress Notes (Signed)
PARENTERAL NUTRITION CONSULT NOTE - FOLLOW UP  Pharmacy Consult:  TPN Indication: prolonged ileus  No Known Allergies  Patient Measurements: Height:  (157.5 cm) Weight: 168 lb 14 oz (76.6 kg) IBW/kg (Calculated) : 54.6 Adjusted Body Weight: 61.2 kg  Vital Signs: Temp: 99.9 F (37.7 C) (09/04 0752) Temp src: Oral (09/04 0752) BP: 147/85 mmHg (09/04 0500) Pulse Rate: 122 (09/04 0600) Intake/Output from previous day: 09/03 0701 - 09/04 0700 In: 3533 [I.V.:230; IV Piggyback:1350; TPN:1953] Out: 3980 [Urine:2050; Emesis/NG output:1450; Drains:480]  Labs:  Recent Labs  01/11/14 0300 01/12/14 0400 01/13/14 0415  WBC 17.2* 19.0* 24.1*  HGB 10.6* 11.2* 12.0*  HCT 32.5* 32.6* 35.2*  PLT 306 407* 487*     Recent Labs  01/11/14 0300 01/12/14 0400 01/13/14 0415  NA 137 137 136*  K 3.7 3.1* 3.6*  CL 101 100 102  CO2 GLUCOSE 150* 122* 121*  BUN CREATININE 0.88 0.77 0.77  CALCIUM 7.4* 7.6* 7.3*  MG 2.0 1.7 1.8  PHOS 2.7 2.3 2.8  PROT  --  5.9*  --   ALBUMIN  --  1.6*  --   AST  --  54*  --   ALT  --  41  --   ALKPHOS  --  98  --   BILITOT  --  1.9*  --   TRIG  --  227*  --    Estimated Creatinine Clearance: 105.7 ml/min (by C-G formula based on Cr of 0.77).    Recent Labs  01/12/14 1908 01/13/14 0028 01/13/14 0554  GLUCAP 126* 144* 148*     Insulin Requirements in the past 24 hours:  4 units sensitive SSI  Assessment: Jesus Hunter s/p laparoscopic appendectomy for gangrenous ruptured appendicitis.  Pharmacy managing TPN for prolonged ileus.  Admit: RLQ abd pain GI: POD#8 appendectomy, perforated viscous >> ileus, NGT to LWIS >> O/P 400 ml, prealbumin low at 8.1 Endo: no hx DM, CBGs acceptable Lytes: Na 136, K 3.6, Mag 1.8, Phos 2.8 Renal: SCr 0.77(stable) -  UOP 1.1 ml/kg/hr Pulm: extubated, 2L02 Cards: HLD / HTN - BP elevated, on scheduled hydralazine  HR 122 Hepatobil: mildly elevated AST, TG elevated at 227, tbili mildly  elevated - ok to continue supplementing trace elements Neuro: Fentanyl patch , GCS 15 ID: vanc, mycamine, zosyn changed to meropenem for appendicitis + peritonitis + abscess s/p drainage 8/27, Tmax 102.7 WBC elevated and trending up  Plan recheck CT chest, abdomen and pelvis Best Practices: heparin SQ, MC, PPI IV TPN Access: triple lumen placed 01/08/14 TPN day#: 4 (8/31 >> )  Current Nutrition:  TPN  Nutritional Goals:  2000-2100 kCal and 100-115gm protein daily   Plan:  - Cont Clinimix E 5/20 at 83 ml/hr + IVFE at 10 ml/hr.  TPN will provide 2233 kCal and 100gm protein daily, meeting 100% of kCal and 100% of minimal protein needs. -Will give 3 runs K as goal > 4 for ileus - Daily multivitamin and trace elements, watch tbili - Consider d/c'ing SSI/CBG checks if CBGs remain controlled at stable TPN rate - Watch TG and decrease lipid provision as appropriate - F/U AM labs    Talbert Cage PharmD, Pager:  [phone] 01/13/2014, 8:05 AM

## 2014-01-13 NOTE — Progress Notes (Signed)
Patient ID: Jesus Hunter, male   DOB: 1971/04/27, 43 y.o.   MRN: [id number]     [facility]      [address] [place], [place] [address]    Phone: [phone] FAX: [phone]     Subjective: Continues to have fevers, worsening white count.  Tachypnic, no NAD.  States not much abdominal pain  Objective:  Vital signs:  Filed Vitals:   01/13/14 0406 01/13/14 0500 01/13/14 0600 01/13/14 0752  BP:  147/85    Pulse:  127 122   Temp: 102.2 F (39 C)   99.9 F (37.7 C)  TempSrc: Oral   Oral  Resp:  26 24   Height:      Weight: 168 lb 14 oz (76.6 kg)     SpO2:  94% 94%     Last BM Date: 01/12/14  Intake/Output   Yesterday:  09/03 0701 - 09/04 0700 In: 2585 [I.V.:230; IV Piggyback:1350; IDP:8242] Out: 3536 [Urine:2050; Emesis/NG output:1450; Drains:480] This shift:    I/O last 3 completed shifts: In: 5126 [I.V.:350; IV Piggyback:1800] Out: 1443 [Urine:2450; Emesis/NG output:1450; Drains:520; Stool:1]     Physical Exam:  General: Pt awake and alert.  Chest: cta. Pulling 1094ml on IS. Tachypnic.  CV: Pulses intact. Regular rhythm. tachy Abdomen: Soft. distended. Mildly tender at incisions only. RLQ JP drain with serous output. No evidence of peritonitis. No incarcerated hernias.  Ext: SCDs BLE. No mjr edema. No cyanosis  Skin: No petechiae / purpura   Problem List:   Active Problems:   Acute perforated appendicitis   Acute respiratory failure   HTN (hypertension)   Sepsis    Results:   Labs: Results for orders placed during the hospital encounter of 01/05/14 (from the past 48 hour(s))  GLUCOSE, CAPILLARY     Status: Abnormal   Collection Time    01/11/14 11:41 AM      Result Value Ref Range   Glucose-Capillary 128 (*) 70 - 99 mg/dL  GLUCOSE, CAPILLARY     Status: Abnormal   Collection Time    01/11/14  3:43 PM      Result Value Ref Range   Glucose-Capillary 144 (*) 70 - 99 mg/dL  GLUCOSE, CAPILLARY      Status: Abnormal   Collection Time    01/11/14 11:53 PM      Result Value Ref Range   Glucose-Capillary 121 (*) 70 - 99 mg/dL   Comment 1 Documented in Chart     Comment 2 Notify RN    CBC     Status: Abnormal   Collection Time    01/12/14  4:00 AM      Result Value Ref Range   WBC 19.0 (*) 4.0 - 10.5 K/uL   RBC 4.09 (*) 4.22 - 5.81 MIL/uL   Hemoglobin 11.2 (*) 13.0 - 17.0 g/dL   HCT 32.6 (*) 39.0 - 52.0 %   MCV 79.7  78.0 - 100.0 fL   MCH 27.4  26.0 - 34.0 pg   MCHC 34.4  30.0 - 36.0 g/dL   RDW 13.6  11.5 - 15.5 %   Platelets 407 (*) 150 - 400 K/uL   Comment: REPEATED TO VERIFY  COMPREHENSIVE METABOLIC PANEL     Status: Abnormal   Collection Time    01/12/14  4:00 AM      Result Value Ref Range   Sodium 137  137 - 147 mEq/L   Potassium 3.1 (*) 3.7 - 5.3 mEq/L  Chloride 100  96 - 112 mEq/L   CO2 23  19 - 32 mEq/L   Glucose, Bld 122 (*) 70 - 99 mg/dL   BUN 17  6 - 23 mg/dL   Creatinine, Ser 0.77  0.50 - 1.35 mg/dL   Calcium 7.6 (*) 8.4 - 10.5 mg/dL   Total Protein 5.9 (*) 6.0 - 8.3 g/dL   Albumin 1.6 (*) 3.5 - 5.2 g/dL   AST 54 (*) 0 - 37 U/L   ALT 41  0 - 53 U/L   Alkaline Phosphatase 98  39 - 117 U/L   Total Bilirubin 1.9 (*) 0.3 - 1.2 mg/dL   GFR calc non Af Amer >90  >90 mL/min   GFR calc Af Amer >90  >90 mL/min   Comment: (NOTE)     The eGFR has been calculated using the CKD EPI equation.     This calculation has not been validated in all clinical situations.     eGFR's persistently <90 mL/min signify possible Chronic Kidney     Disease.   Anion gap 14  5 - 15  MAGNESIUM     Status: None   Collection Time    01/12/14  4:00 AM      Result Value Ref Range   Magnesium 1.7  1.5 - 2.5 mg/dL  PHOSPHORUS     Status: None   Collection Time    01/12/14  4:00 AM      Result Value Ref Range   Phosphorus 2.3  2.3 - 4.6 mg/dL  TRIGLYCERIDES     Status: Abnormal   Collection Time    01/12/14  4:00 AM      Result Value Ref Range   Triglycerides 227 (*) <150 mg/dL   GLUCOSE, CAPILLARY     Status: Abnormal   Collection Time    01/12/14  8:17 AM      Result Value Ref Range   Glucose-Capillary 141 (*) 70 - 99 mg/dL  VANCOMYCIN, TROUGH     Status: Abnormal   Collection Time    01/12/14 12:05 PM      Result Value Ref Range   Vancomycin Tr 7.9 (*) 10.0 - 20.0 ug/mL  GLUCOSE, CAPILLARY     Status: Abnormal   Collection Time    01/12/14 12:55 PM      Result Value Ref Range   Glucose-Capillary 144 (*) 70 - 99 mg/dL  GLUCOSE, CAPILLARY     Status: Abnormal   Collection Time    01/12/14  7:08 PM      Result Value Ref Range   Glucose-Capillary 126 (*) 70 - 99 mg/dL   Comment 1 Notify RN    PROCALCITONIN     Status: None   Collection Time    01/12/14 11:31 PM      Result Value Ref Range   Procalcitonin 2.37     Comment:            Interpretation:     PCT > 2 ng/mL:     Systemic infection (sepsis) is likely,     unless other causes are known.     (NOTE)             ICU PCT Algorithm               Non ICU PCT Algorithm        ----------------------------     ------------------------------             PCT < 0.25 ng/mL  PCT < 0.1 ng/mL         Stopping of antibiotics            Stopping of antibiotics           strongly encouraged.               strongly encouraged.        ----------------------------     ------------------------------           PCT level decrease by               PCT < 0.25 ng/mL           >= 80% from peak PCT           OR PCT 0.25 - 0.5 ng/mL          Stopping of antibiotics                                                 encouraged.         Stopping of antibiotics               encouraged.        ----------------------------     ------------------------------           PCT level decrease by              PCT >= 0.25 ng/mL           < 80% from peak PCT            AND PCT >= 0.5 ng/mL            Continuing antibiotics                                                  encouraged.           Continuing antibiotics                 encouraged.        ----------------------------     ------------------------------         PCT level increase compared          PCT > 0.5 ng/mL             with peak PCT AND              PCT >= 0.5 ng/mL             Escalation of antibiotics                                              strongly encouraged.          Escalation of antibiotics            strongly encouraged.  LACTIC ACID, PLASMA     Status: None   Collection Time    01/12/14 11:59 PM      Result Value Ref Range   Lactic Acid, Venous 1.5  0.5 - 2.2 mmol/L  GLUCOSE, CAPILLARY     Status: Abnormal   Collection Time  01/13/14 12:28 AM      Result Value Ref Range   Glucose-Capillary 144 (*) 70 - 99 mg/dL  CBC     Status: Abnormal   Collection Time    01/13/14  4:15 AM      Result Value Ref Range   WBC 24.1 (*) 4.0 - 10.5 K/uL   RBC 4.41  4.22 - 5.81 MIL/uL   Hemoglobin 12.0 (*) 13.0 - 17.0 g/dL   HCT 35.2 (*) 39.0 - 52.0 %   MCV 79.8  78.0 - 100.0 fL   MCH 27.2  26.0 - 34.0 pg   MCHC 34.1  30.0 - 36.0 g/dL   RDW 13.7  11.5 - 15.5 %   Platelets 487 (*) 150 - 400 K/uL  BASIC METABOLIC PANEL     Status: Abnormal   Collection Time    01/13/14  4:15 AM      Result Value Ref Range   Sodium 136 (*) 137 - 147 mEq/L   Potassium 3.6 (*) 3.7 - 5.3 mEq/L   Chloride 102  96 - 112 mEq/L   CO2 20  19 - 32 mEq/L   Glucose, Bld 121 (*) 70 - 99 mg/dL   BUN 16  6 - 23 mg/dL   Creatinine, Ser 0.77  0.50 - 1.35 mg/dL   Calcium 7.3 (*) 8.4 - 10.5 mg/dL   GFR calc non Af Amer >90  >90 mL/min   GFR calc Af Amer >90  >90 mL/min   Comment: (NOTE)     The eGFR has been calculated using the CKD EPI equation.     This calculation has not been validated in all clinical situations.     eGFR's persistently <90 mL/min signify possible Chronic Kidney     Disease.   Anion gap 14  5 - 15  MAGNESIUM     Status: None   Collection Time    01/13/14  4:15 AM      Result Value Ref Range   Magnesium 1.8  1.5 - 2.5 mg/dL   PHOSPHORUS     Status: None   Collection Time    01/13/14  4:15 AM      Result Value Ref Range   Phosphorus 2.8  2.3 - 4.6 mg/dL  PROCALCITONIN     Status: None   Collection Time    01/13/14  4:15 AM      Result Value Ref Range   Procalcitonin 2.25     Comment:            Interpretation:     PCT > 2 ng/mL:     Systemic infection (sepsis) is likely,     unless other causes are known.     (NOTE)             ICU PCT Algorithm               Non ICU PCT Algorithm        ----------------------------     ------------------------------             PCT < 0.25 ng/mL                 PCT < 0.1 ng/mL         Stopping of antibiotics            Stopping of antibiotics           strongly encouraged.  strongly encouraged.        ----------------------------     ------------------------------           PCT level decrease by               PCT < 0.25 ng/mL           >= 80% from peak PCT           OR PCT 0.25 - 0.5 ng/mL          Stopping of antibiotics                                                 encouraged.         Stopping of antibiotics               encouraged.        ----------------------------     ------------------------------           PCT level decrease by              PCT >= 0.25 ng/mL           < 80% from peak PCT            AND PCT >= 0.5 ng/mL            Continuing antibiotics                                                  encouraged.           Continuing antibiotics                encouraged.        ----------------------------     ------------------------------         PCT level increase compared          PCT > 0.5 ng/mL             with peak PCT AND              PCT >= 0.5 ng/mL             Escalation of antibiotics                                              strongly encouraged.          Escalation of antibiotics            strongly encouraged.  GLUCOSE, CAPILLARY     Status: Abnormal   Collection Time    01/13/14  5:54 AM      Result Value Ref Range    Glucose-Capillary 148 (*) 70 - 99 mg/dL    Imaging / Studies: Dg Chest Port 1 View  01/13/2014   CLINICAL DATA:  Fever, short of breath ?pneumonia  EXAM: PORTABLE CHEST - 1 VIEW  COMPARISON:  01/10/2014  FINDINGS: Increased patchy airspace opacity along the periphery right lung. Decreased retrocardiac opacity with some residual Hypoaeration. Interstitial and vascular crowding. Elevated right hemidiaphragm. Right pleural effusion not excluded. Left IJ central venous catheter tip projects over  the cavoatrial junction. NG tube descends over the distal stomach.  IMPRESSION: Hypoaeration and bibasilar opacities. Increased peripheral right lung opacity, may reflect atelectasis and/or pneumonia.   Electronically Signed   By: Carlos Levering M.D.   On: 01/13/2014 04:47   Dg Abd Portable 1v  01/13/2014   CLINICAL DATA:  Abdominal pain, recent appendectomy.  EXAM: PORTABLE ABDOMEN - 1 VIEW  COMPARISON:  01/10/2014  FINDINGS: NG tube tip projects over the distal stomach. There is contrast within colon. Nonspecific small bowel loop dilatation in the left upper quadrant. Surgical drain right lower quadrant and surgical clips in keeping with recent history of appendectomy.  IMPRESSION: NG tube tip projects over the distal stomach.  Postsurgical changes of recent appendectomy with drain in place.  Dilated loop of small bowel loops in the left upper abdomen is nonspecific and may reflect ileus. There is air and previously ingested contrast within normal caliber colon.   Electronically Signed   By: Carlos Levering M.D.   On: 01/13/2014 04:45    Medications / Allergies:  Scheduled Meds: . antiseptic oral rinse  7 mL Mouth Rinse QID  . chlorhexidine  15 mL Mouth Rinse BID  . fentaNYL  75 mcg Transdermal Q72H  . heparin subcutaneous  5,000 Units Subcutaneous 3 times per day  . hydrALAZINE  10 mg Intravenous Q6H  . insulin aspart  0-9 Units Subcutaneous 4 times per day  . meropenem (MERREM) IV  1 g Intravenous 3 times  per day  . micafungin [facility]) IV  100 mg Intravenous Daily  . vancomycin  1,250 mg Intravenous Q6H   Continuous Infusions: . sodium chloride 10 mL/hr (01/10/14 0800)  . Marland KitchenTPN (CLINIMIX-E) Adult 83 mL/hr at 01/12/14 1712   And  . fat emulsion 250 mL (01/12/14 1713)   PRN Meds:.acetaminophen (TYLENOL) oral liquid 160 mg/5 mL, acetaminophen, acetaminophen, albuterol, HYDROmorphone (DILAUDID) injection, labetalol, ondansetron (ZOFRAN) IV  Antibiotics: Anti-infectives   Start     Dose/Rate Route Frequency Ordered Stop   01/12/14 1400  vancomycin (VANCOCIN) 1,250 mg in sodium chloride 0.9 % 250 mL IVPB     1,250 mg 166.7 mL/hr over 90 Minutes Intravenous Every 6 hours 01/12/14 1305     01/11/14 1400  meropenem (MERREM) 1 g in sodium chloride 0.9 % 100 mL IVPB     1 g 200 mL/hr over 30 Minutes Intravenous 3 times per day 01/11/14 1138     01/11/14 1300  vancomycin (VANCOCIN) IVPB 1000 mg/200 mL premix  Status:  Discontinued     1,000 mg 200 mL/hr over 60 Minutes Intravenous Every 8 hours 01/11/14 1233 01/12/14 1305   01/10/14 1500  micafungin (MYCAMINE) 100 mg in sodium chloride 0.9 % 100 mL IVPB     100 mg 100 mL/hr over 1 Hours Intravenous Daily 01/10/14 1405     01/08/14 2200  vancomycin (VANCOCIN) IVPB 1000 mg/200 mL premix  Status:  Discontinued     1,000 mg 200 mL/hr over 60 Minutes Intravenous Every 12 hours 01/08/14 0913 01/10/14 1417   01/08/14 0945  vancomycin (VANCOCIN) 2,000 mg in sodium chloride 0.9 % 500 mL IVPB     2,000 mg 250 mL/hr over 120 Minutes Intravenous  Once 01/08/14 0913 01/08/14 1206   01/05/14 2200  piperacillin-tazobactam (ZOSYN) IVPB 3.375 g  Status:  Discontinued     3.375 g 12.5 mL/hr over 240 Minutes Intravenous 3 times per day 01/05/14 1847 01/11/14 1138   01/05/14 1345  piperacillin-tazobactam (ZOSYN) IVPB 3.375 g  3.375 g 100 mL/hr over 30 Minutes Intravenous  Once 01/05/14 1343 01/05/14 1445      Assessment/Plan  Gangrenous Ruptured  Appendicitis---Dr. Hulen Skains 01/05/14  PCM  POD#8  -continue with IV antibiotics  ileus - resolving, NGT to LWIS, will consider clamping trial based on CT results and if output decreases. -SCD/lovenox  -continue foley for I&O  -drain care(serous)  -cultures-multiple organisms present  -zosyn 8/27-9/2. Vanc 8/30---> Micafungin 9/1---> Meropenem 9/2--->  -CT of abdomen/pelvis negative for abscess(8/31) shows bibasilar PNA.  Continues to have fevers, tachycardia, tachypnea and increasing white count.  Will obtain a CT of chest, abdomen and pelvis.   -TPN  VDRF  -appreciate CCM management  -Vanc for PNA  AKI-resolved, monitor renal function as this is his 2nd CT in 4 days. HTN-hydralazine scheduled, PRN labetalol  Dispo-continue ICU  Erby Pian, [facility] Surgery Pager [phone](7A-4:30P) For consults and floor pages call [phone](7A-4:30P)  01/13/2014 8:11 AM

## 2014-01-14 ENCOUNTER — Inpatient Hospital Stay (HOSPITAL_COMMUNITY): Payer: BC Managed Care – PPO

## 2014-01-14 LAB — BASIC METABOLIC PANEL
Anion gap: 11 (ref 5–15)
BUN: 17 mg/dL (ref 6–23)
CHLORIDE: 101 meq/L (ref 96–112)
CO2: 22 mEq/L (ref 19–32)
CREATININE: 0.78 mg/dL (ref 0.50–1.35)
Calcium: 7.3 mg/dL — ABNORMAL LOW (ref 8.4–10.5)
GFR calc non Af Amer: >90 mL/min (ref >90)
Glucose, Bld: 144 mg/dL — ABNORMAL HIGH (ref 70–99)
Potassium: 3.9 mEq/L (ref 3.7–5.3)
Sodium: 134 mEq/L — ABNORMAL LOW (ref 137–147)

## 2014-01-14 LAB — CBC WITH DIFFERENTIAL/PLATELET
Basophils Absolute: 0 10*3/uL (ref 0.0–0.1)
Basophils Relative: 0 % (ref 0–1)
EOS PCT: 0 % (ref 0–5)
Eosinophils Absolute: 0 10*3/uL (ref 0.0–0.7)
HEMATOCRIT: 36.4 % — AB (ref 39.0–52.0)
Hemoglobin: 12 g/dL — ABNORMAL LOW (ref 13.0–17.0)
LYMPHS ABS: 2 10*3/uL (ref 0.7–4.0)
Lymphocytes Relative: 7 % — ABNORMAL LOW (ref 12–46)
MCH: 27.5 pg (ref 26.0–34.0)
MCHC: 33 g/dL (ref 30.0–36.0)
MCV: 83.3 fL (ref 78.0–100.0)
MONO ABS: 1.2 10*3/uL — AB (ref 0.1–1.0)
Monocytes Relative: 4 % (ref 3–12)
NEUTROS ABS: 25.8 10*3/uL — AB (ref 1.7–7.7)
Neutrophils Relative %: 89 % — ABNORMAL HIGH (ref 43–77)
Platelets: 470 10*3/uL — ABNORMAL HIGH (ref 150–400)
RBC: 4.37 MIL/uL (ref 4.22–5.81)
RDW: 14.1 % (ref 11.5–15.5)
WBC: 29 10*3/uL — AB (ref 4.0–10.5)

## 2014-01-14 LAB — CULTURE, BLOOD (ROUTINE X 2)
CULTURE: NO GROWTH
Culture: NO GROWTH

## 2014-01-14 LAB — GLUCOSE, CAPILLARY
GLUCOSE-CAPILLARY: 174 mg/dL — AB (ref 70–99)
Glucose-Capillary: 137 mg/dL — ABNORMAL HIGH (ref 70–99)
Glucose-Capillary: 145 mg/dL — ABNORMAL HIGH (ref 70–99)

## 2014-01-14 LAB — PROTIME-INR
INR: 1.22 (ref 0.00–1.49)
Prothrombin Time: 15.4 seconds — ABNORMAL HIGH (ref 11.6–15.2)

## 2014-01-14 LAB — PROCALCITONIN: Procalcitonin: 1.89 ng/mL

## 2014-01-14 MED ORDER — LINEZOLID 2 MG/ML IV SOLN
600.0000 mg | Freq: Two times a day (BID) | INTRAVENOUS | Status: DC
  Administered 2014-01-14 – 2014-01-25 (×23): 600 mg via INTRAVENOUS
  Filled 2014-01-14 (×26): qty 300

## 2014-01-14 MED ORDER — FAT EMULSION 20 % IV EMUL
250.0000 mL | INTRAVENOUS | Status: AC
  Administered 2014-01-14: 250 mL via INTRAVENOUS
  Filled 2014-01-14: qty 250

## 2014-01-14 MED ORDER — FENTANYL CITRATE 0.05 MG/ML IJ SOLN
INTRAMUSCULAR | Status: AC
  Filled 2014-01-14: qty 2

## 2014-01-14 MED ORDER — TRACE MINERALS CR-CU-F-FE-I-MN-MO-SE-ZN IV SOLN
INTRAVENOUS | Status: AC
  Administered 2014-01-14: 17:00:00 via INTRAVENOUS
  Filled 2014-01-14: qty 2000

## 2014-01-14 MED ORDER — METOPROLOL TARTRATE 1 MG/ML IV SOLN
2.5000 mg | INTRAVENOUS | Status: DC | PRN
  Administered 2014-01-15 – 2014-01-16 (×3): 5 mg via INTRAVENOUS
  Filled 2014-01-14 (×3): qty 5

## 2014-01-14 MED ORDER — MIDAZOLAM HCL 2 MG/2ML IJ SOLN
INTRAMUSCULAR | Status: AC | PRN
  Administered 2014-01-14: 0.5 mg via INTRAVENOUS

## 2014-01-14 MED ORDER — FENTANYL CITRATE 0.05 MG/ML IJ SOLN
INTRAMUSCULAR | Status: AC | PRN
  Administered 2014-01-14: 25 ug via INTRAVENOUS

## 2014-01-14 MED ORDER — MIDAZOLAM HCL 2 MG/2ML IJ SOLN
INTRAMUSCULAR | Status: AC
  Filled 2014-01-14: qty 2

## 2014-01-14 MED ORDER — MIDAZOLAM HCL 2 MG/2ML IJ SOLN
0.5000 mg | INTRAMUSCULAR | Status: DC | PRN
  Administered 2014-01-14 – 2014-01-15 (×4): 0.5 mg via INTRAVENOUS
  Filled 2014-01-14 (×4): qty 2

## 2014-01-14 MED ORDER — HEPARIN SODIUM (PORCINE) 5000 UNIT/ML IJ SOLN
5000.0000 [IU] | Freq: Three times a day (TID) | INTRAMUSCULAR | Status: DC
  Administered 2014-01-14 – 2014-01-17 (×8): 5000 [IU] via SUBCUTANEOUS
  Filled 2014-01-14 (×9): qty 1

## 2014-01-14 MED ORDER — HYDRALAZINE HCL 20 MG/ML IJ SOLN
10.0000 mg | INTRAMUSCULAR | Status: DC | PRN
  Administered 2014-01-24 – 2014-01-25 (×2): 10 mg via INTRAVENOUS
  Filled 2014-01-14 (×3): qty 1

## 2014-01-14 MED ORDER — LIDOCAINE HCL 1 % IJ SOLN
INTRAMUSCULAR | Status: AC
  Filled 2014-01-14: qty 10

## 2014-01-14 NOTE — Progress Notes (Signed)
Agree.  Reviewed CT.  Reasonable to aspirate pelvic collection with possible drain placement.  The LUQ subphrenic fluid is above spleen, and difficult to access percutaneously.

## 2014-01-14 NOTE — Sedation Documentation (Signed)
Successful drain to rt gluteaus jackson pratt connected to drain, purulent  Drainage noted  drainage

## 2014-01-14 NOTE — Progress Notes (Signed)
PARENTERAL NUTRITION CONSULT NOTE - FOLLOW UP  Pharmacy Consult:  TPN Indication: prolonged ileus  No Known Allergies  Patient Measurements: Height:  (157.5 cm) Weight: 171 lb 11.8 oz (77.9 kg) IBW/kg (Calculated) : 54.6 Adjusted Body Weight: 61.2 kg  Vital Signs: Temp: 100.5 F (38.1 C) (09/05 0427) Temp src: Oral (09/05 0427) BP: 150/90 mmHg (09/05 0600) Pulse Rate: 124 (09/05 0600) Intake/Output from previous day: 09/04 0701 - 09/05 0700 In: 3769 [I.V.:230; IV Piggyback:1400; TPN:2139] Out: 3500 [Urine:2055; Emesis/NG output:1250; Drains:195]  Labs:  Recent Labs  01/12/14 0400 01/13/14 0415 01/14/14 0433  WBC 19.0* 24.1* 29.0*  HGB 11.2* 12.0* 12.0*  HCT 32.6* 35.2* 36.4*  PLT 407* 487* 470*     Recent Labs  01/12/14 0400 01/13/14 0415 01/14/14 0433  NA 137 136* 134*  K 3.1* 3.6* 3.9  CL 100 102 101  CO2 GLUCOSE 122* 121* 144*  BUN CREATININE 0.77 0.77 0.78  CALCIUM 7.6* 7.3* 7.3*  MG 1.7 1.8  --   PHOS 2.3 2.8  --   PROT 5.9*  --   --   ALBUMIN 1.6*  --   --   AST 54*  --   --   ALT 41  --   --   ALKPHOS 98  --   --   BILITOT 1.9*  --   --   TRIG 227*  --   --    Estimated Creatinine Clearance: 106.5 ml/min (by C-G formula based on Cr of 0.78).    Recent Labs  01/13/14 1418 01/13/14 1826 01/13/14 2345  GLUCAP 142* 137* 137*     Insulin Requirements in the past 24 hours:  4 units sensitive SSI  Assessment: 42 YOM s/p laparoscopic appendectomy for gangrenous ruptured appendicitis.  Pharmacy managing TPN for prolonged ileus.  Admit: RLQ abd pain GI: POD#9 appendectomy, perforated viscous >> ileus, NGT to LWIS >> O/P 1250 ml, prealbumin low at 8.1  LBM 9/3 Endo: no hx DM, CBGs acceptable Lytes: Na 134, K 3.9, Mag 1.8, Phos 2.8 Renal: SCr 0.78(stable) -  UOP 1.1 ml/kg/hr Pulm: extubated, 5L02 Cards: HLD / HTN - BP elevated, tachycardic  on scheduled hydralazine   Hepatobil: mildly elevated AST, TG elevated  at 227, tbili mildly elevated - ok to continue supplementing trace elements Neuro: Fentanyl patch  ID: vanc, mycamine, zosyn changed to meropenem for appendicitis + peritonitis + abscess s/p drainage 8/27, Tmax 100.5 WBC elevated and trending up  CT chest shows bibasilar PNA, fluid collections in abdomen unimpressive Best Practices: heparin SQ, MC, PPI IV TPN Access: triple lumen placed 01/08/14 TPN day#: 5 (8/31 >> )  Current Nutrition:  TPN  Nutritional Goals:  2000-2100 kCal and 100-115gm protein daily   Plan:  - Cont Clinimix E 5/20 at 83 ml/hr + IVFE at 10 ml/hr.  TPN will provide 2233 kCal and 100gm protein daily, meeting 100% of kCal and 100% of minimal protein needs. - Daily multivitamin and trace elements, watch tbili - Consider d/c'ing SSI/CBG checks if CBGs remain controlled at stable TPN rate - Watch TG and decrease lipid provision as appropriate - F/U AM labs    Talbert Cage PharmD, Pager:  [phone] 01/14/2014, 7:08 AM

## 2014-01-14 NOTE — Progress Notes (Signed)
PULMONARY / CRITICAL CARE MEDICINE  Name: Jesus Hunter MRN: [id number] DOB: Mar 15, 1971    ADMISSION DATE:  01/05/2014 CONSULTATION DATE:  01/08/2014  REFERRING MD :  Catha Gosselin  CHIEF COMPLAINT:  Respiratory distress  INITIAL PRESENTATION: 43 yo presented 8/27 with ruptured appendix, taken to surgery for laparoscopic appendectomy and drainage of intra-abd abscess. On 8/30 developed sepsis and respiratory distress requiring intubation.  STUDIES / EVENTS:  8/27  Abdomen CT >>>  Ruptured appendix  8/30  Sepsis / acute respiratory failure >>> intubated 8/31  Abdomen CT >>> Ileus, mildly distended gallbladder without stones, bibasal atelectasis  9/1    Opioids withdrawal suspected 9/2    Extubated  9/4    Chest / abdomen CT >>> bilateral dense pulmonary infiltrates, several abdominal fluid collections  INTERVAL HISTORY:  Remains with signs of SIRS, reports some upper abdominal discomfort.  VITAL SIGNS: Temp:  [99.9 F (37.7 C)-102.5 F (39.2 C)] 100.5 F (38.1 C) (09/05 0427) Pulse Rate:  [109-146] 124 (09/05 0600) Resp:  [24-40] 28 (09/05 0600) BP: (141-195)/(78-110) 150/90 mmHg (09/05 0600) SpO2:  [89 %-95 %] 95 % (09/05 0600) Weight:  [77.9 kg (171 lb 11.8 oz)] 77.9 kg (171 lb 11.8 oz) (09/05 0434)  HEMODYNAMICS:   VENTILATOR SETTINGS:   INTAKE / OUTPUT: Intake/Output     09/04 0701 - 09/05 0700 09/05 0701 - 09/06 0700   I.V. (mL/kg) 230 (3)    IV Piggyback 1400    TPN 2139    Total Intake(mL/kg) 3769 (48.4)    Urine (mL/kg/hr) 2055 (1.1)    Emesis/NG output 1250 (0.7)    Drains 195 (0.1)    Total Output 3500     Net +269           PHYSICAL EXAMINATION: General: Work of breathing is somewhat increased Neuro:  Awake, alert HEENT:  NGT, draining bilious fluid Cardiovascular:  Regular, no murmurs Lungs:  Bilateral rales Abdomen:  Abdomen somewhat tender, bowel sounds diminished Musculoskeletal:  Intact  Skin:  Intact   LABS:  CBC  Recent Labs Lab 01/12/14 0400  01/13/14 0415 01/14/14 0433  WBC 19.0* 24.1* 29.0*  HGB 11.2* 12.0* 12.0*  HCT 32.6* 35.2* 36.4*  PLT 407* 487* 470*   Coag's No results found for this basename: APTT, INR,  in the last 168 hours  BMET  Recent Labs Lab 01/12/14 0400 01/13/14 0415 01/14/14 0433  NA 137 136* 134*  K 3.1* 3.6* 3.9  CL 100 102 101  CO2 BUN CREATININE 0.77 0.77 0.78  GLUCOSE 122* 121* 144*   Electrolytes  Recent Labs Lab 01/11/14 0300 01/12/14 0400 01/13/14 0415 01/14/14 0433  CALCIUM 7.4* 7.6* 7.3* 7.3*  MG 2.0 1.7 1.8  --   PHOS 2.7 2.3 2.8  --    Sepsis Markers  Recent Labs Lab 01/08/14 1630 01/12/14 2331 01/12/14 2359 01/13/14 0415 01/14/14 0433  LATICACIDVEN 1.9  --  1.5  --   --   PROCALCITON  --  2.37  --  2.25 1.89   ABG  Recent Labs Lab 01/08/14 1608 01/08/14 1825 01/09/14 0350  PHART 7.436 7.362 7.377  PCO2ART 37.4 49.1* 49.8*  PO2ART 59.6* 208.0* 106.0*   Liver Enzymes  Recent Labs Lab 01/09/14 0505 01/10/14 0525 01/12/14 0400  AST 31 39* 54*  ALT 25 30 41  ALKPHOS 46 58 98  BILITOT 1.3* 1.9* 1.9*  ALBUMIN 1.8* 1.5* 1.6*   Cardiac Enzymes  Recent Labs Lab 01/08/14 [phone]  01/08/14 2240 01/09/14 0505 01/09/14 1000  TROPONINI <0.30  < > <0.30 <0.30 <0.30  PROBNP 334.9*  --   --   --   --   < > = values in this interval not displayed. Glucose  Recent Labs Lab 01/12/14 1908 01/13/14 0028 01/13/14 0554 01/13/14 1418 01/13/14 1826 01/13/14 2345  GLUCAP 126* 144* 148* 142* 137* 137*   IMAGING:   Ct Chest W Contrast  01/13/2014   CLINICAL DATA:  Ruptured appendix.  Sepsis.  EXAM: CT CHEST WITH CONTRAST  CT ABDOMEN AND PELVIS WITH CONTRAST  TECHNIQUE: Multidetector CT imaging of the chest was performed during intravenous contrast administration. Multidetector CT imaging of the abdomen and pelvis was performed following the standard protocol before and during bolus administration of intravenous contrast.  CONTRAST:  80mL  OMNIPAQUE IOHEXOL 300 MG/ML  SOLN  COMPARISON:  Chest x-ray 01/13/2014 and 01/10/2014. CT abdomen pelvis 01/09/2014.  FINDINGS: CT CHEST FINDINGS  Thoracic aorta normal in caliber. No aneurysm. Heart size normal. Left IJ catheter noted with its tip in the lower superior vena cava. NG tube noted with tip in the stomach.  Shotty mediastinal lymph nodes.  Esophagus is nondistended.  Large airways are patent. Severe dense diffuse pulmonary infiltrates are present with this basilar atelectasis and moderate bilateral pleural effusions. The prominent dense patchy bilateral pulmonary infiltrates are most consistent with pneumonia. Process such as pulmonary edema/ARDS cannot be excluded.  Thyroid is unremarkable. No significant axillary adenopathy. No acute bony abnormality identified.  CT ABDOMEN AND PELVIS FINDINGS  No focal hepatic abnormality. Spleen is unremarkable. Pancreas is unremarkable. No biliary distention. Gallbladder is nondistended. A pericholecystic/subhepatic fluid collection is present. Maximum diameter is 3 cm. This could represent ascites. A phlegmon and/or abscess cannot be excluded. Left subdiaphragmatic fluid collections are noted. These measure up to 3.5 cm. This could represent loculated ascites. Seven Left medic phlegmon and/or abscess abscess on the left cannot be excluded. No air noted in these fluid collections. 5.3 cm fluid collection in the pelvis. This could represent ascites, however pelvic abscess cannot be excluded. No air noted within this fluid collection.  Adrenals are unremarkable. Symmetric bilateral renal perfusion. No focal renal abnormality. No evidence of hydronephrosis. The bladder is nondistended. Prostate is not enlarged.  No significant inguinal or retroperitoneal adenopathy. Abdominal aorta normal in caliber.  Right lower quadrant drain catheter is noted. No evidence of right lower quadrant abscess. Inflammatory changes in the right lower quadrant noted consistent with prior  appendicitis. Prominent thickening of small and large bowel walls. These changes may be reactive. Enterocolitis could present in this fashion. Ischemic bowel cannot be excluded. Stomach is nondistended. No evidence of free air. No abdominal wall significant hernia.  No acute bony abnormality identified.  IMPRESSION: 1. Severe diffuse dense bilateral pulmonary infiltrates most consistent severe bilateral pneumonia. Dense bibasilar atelectasis and moderate bilateral pleural effusions are present. 2. Prominent fluid collections are noted in the pericholecystic/ right subhepatic region, left subdiaphragmatic region, and in the pelvis. These could represent loculated regions of ascites however phlegmon/ developing abscess abscess in these regions cannot be excluded. 3. Severe thickened small large bowel wall. Although these changes may be reactive, enterocolitis or ischemic bowel cannot be excluded. 4. Right lower quadrant drainage catheter is again noted. No prominent fluid collections in this region. Adjacent inflammatory changes are present from patient's prior appendicitis.   Electronically Signed   By: Maisie Fus  Register   On: 01/13/2014 13:11   Ct Abdomen Pelvis W Contrast  01/13/2014  CLINICAL DATA:  Ruptured appendix.  Sepsis.  EXAM: CT CHEST WITH CONTRAST  CT ABDOMEN AND PELVIS WITH CONTRAST  TECHNIQUE: Multidetector CT imaging of the chest was performed during intravenous contrast administration. Multidetector CT imaging of the abdomen and pelvis was performed following the standard protocol before and during bolus administration of intravenous contrast.  CONTRAST:  80mL OMNIPAQUE IOHEXOL 300 MG/ML  SOLN  COMPARISON:  Chest x-ray 01/13/2014 and 01/10/2014. CT abdomen pelvis 01/09/2014.  FINDINGS: CT CHEST FINDINGS  Thoracic aorta normal in caliber. No aneurysm. Heart size normal. Left IJ catheter noted with its tip in the lower superior vena cava. NG tube noted with tip in the stomach.  Shotty mediastinal  lymph nodes.  Esophagus is nondistended.  Large airways are patent. Severe dense diffuse pulmonary infiltrates are present with this basilar atelectasis and moderate bilateral pleural effusions. The prominent dense patchy bilateral pulmonary infiltrates are most consistent with pneumonia. Process such as pulmonary edema/ARDS cannot be excluded.  Thyroid is unremarkable. No significant axillary adenopathy. No acute bony abnormality identified.  CT ABDOMEN AND PELVIS FINDINGS  No focal hepatic abnormality. Spleen is unremarkable. Pancreas is unremarkable. No biliary distention. Gallbladder is nondistended. A pericholecystic/subhepatic fluid collection is present. Maximum diameter is 3 cm. This could represent ascites. A phlegmon and/or abscess cannot be excluded. Left subdiaphragmatic fluid collections are noted. These measure up to 3.5 cm. This could represent loculated ascites. Seven Left medic phlegmon and/or abscess abscess on the left cannot be excluded. No air noted in these fluid collections. 5.3 cm fluid collection in the pelvis. This could represent ascites, however pelvic abscess cannot be excluded. No air noted within this fluid collection.  Adrenals are unremarkable. Symmetric bilateral renal perfusion. No focal renal abnormality. No evidence of hydronephrosis. The bladder is nondistended. Prostate is not enlarged.  No significant inguinal or retroperitoneal adenopathy. Abdominal aorta normal in caliber.  Right lower quadrant drain catheter is noted. No evidence of right lower quadrant abscess. Inflammatory changes in the right lower quadrant noted consistent with prior appendicitis. Prominent thickening of small and large bowel walls. These changes may be reactive. Enterocolitis could present in this fashion. Ischemic bowel cannot be excluded. Stomach is nondistended. No evidence of free air. No abdominal wall significant hernia.  No acute bony abnormality identified.  IMPRESSION: 1. Severe diffuse dense  bilateral pulmonary infiltrates most consistent severe bilateral pneumonia. Dense bibasilar atelectasis and moderate bilateral pleural effusions are present. 2. Prominent fluid collections are noted in the pericholecystic/ right subhepatic region, left subdiaphragmatic region, and in the pelvis. These could represent loculated regions of ascites however phlegmon/ developing abscess abscess in these regions cannot be excluded. 3. Severe thickened small large bowel wall. Although these changes may be reactive, enterocolitis or ischemic bowel cannot be excluded. 4. Right lower quadrant drainage catheter is again noted. No prominent fluid collections in this region. Adjacent inflammatory changes are present from patient's prior appendicitis.   Electronically Signed   By: Maisie Fus  Register   On: 01/13/2014 13:11   Dg Chest Port 1 View  01/13/2014   CLINICAL DATA:  Fever, short of breath ?pneumonia  EXAM: PORTABLE CHEST - 1 VIEW  COMPARISON:  01/10/2014  FINDINGS: Increased patchy airspace opacity along the periphery right lung. Decreased retrocardiac opacity with some residual Hypoaeration. Interstitial and vascular crowding. Elevated right hemidiaphragm. Right pleural effusion not excluded. Left IJ central venous catheter tip projects over the cavoatrial junction. NG tube descends over the distal stomach.  IMPRESSION: Hypoaeration and bibasilar opacities. Increased peripheral right  lung opacity, may reflect atelectasis and/or pneumonia.   Electronically Signed   By: Jearld Lesch M.D.   On: 01/13/2014 04:47   Dg Abd Portable 1v  01/13/2014   CLINICAL DATA:  Abdominal pain, recent appendectomy.  EXAM: PORTABLE ABDOMEN - 1 VIEW  COMPARISON:  01/10/2014  FINDINGS: NG tube tip projects over the distal stomach. There is contrast within colon. Nonspecific small bowel loop dilatation in the left upper quadrant. Surgical drain right lower quadrant and surgical clips in keeping with recent history of appendectomy.   IMPRESSION: NG tube tip projects over the distal stomach.  Postsurgical changes of recent appendectomy with drain in place.  Dilated loop of small bowel loops in the left upper abdomen is nonspecific and may reflect ileus. There is air and previously ingested contrast within normal caliber colon.   Electronically Signed   By: Jearld Lesch M.D.   On: 01/13/2014 04:45   ASSESSMENT / PLAN:  PULMONARY A:  Acute hypoxemic respiratory failure in setting of perforated viscus, abdominal surgery and possible opioid withdrawal; extubated 9/2 Possible HCAP, but should have been responding to wide spectrum antibiotics ARDS secondary to intraabdominal sepsis? Bilateral dense airspace disease on CT 9/4 P:   Supplemental oxygen Goal SpO2>92 Incentive spirometry / flutter valve See abx changes below Not sure how bronchoscopy would change treatment plan, also risky with compromised respiratory status  CARDIOVASCULAR A: HTN SIRS / sepsis P:  Goal BP < 160/90 Hydralazine Labetalol PRN  RENAL A:   No active issues P:   Trend BMP  GASTROINTESTINAL A:   Ruptured appendicitis Ileus Ascites / abdominal fluid collections on CT 9/4 GI Px is not required Nutrition P:   NPO NGT to Sx TPN  HEMATOLOGIC A:   VTE Px P:  Heparin [place]  INFECTIOUS A:   Appendicitis, peritonitis, abscess s/p appendectomy and drainage 8/27 Abscess culture 8/27 >>> polymicrobial Possible HCAP WBC is increasing and SIRS / sepsis picture; concern for abscess as lack of proper response to extended spectrum antibiotics and antifungals P:   Meropenem 9/2 >>> Micafungin 9/1 >>> D/c Vancomycin 8/30 >>> 9/5 Start Zyvox 9/5 >>> Consider aminoglycoside Consider ID consult  ENDOCRINE A: At risk for hyperglycemia with TNA   P:   SSI  NEUROLOGIC A:   Possible opioid use / abuse prior to admission? Possible opioid withdrawal ( high tolerance, fever, agitation / diaphoresis promptly responding to IV opioids  ) Post op pain P:   Fentanyl patch Dilaudid PRN  I have personally obtained history, examined patient, evaluated and interpreted laboratory and imaging results, reviewed medical records, formulated assessment / plan and placed orders.  CRITICAL CARE:  The patient is critically ill with multiple organ systems failure and requires high complexity decision making for assessment and support, frequent evaluation and titration of therapies, application of advanced monitoring technologies and extensive interpretation of multiple databases. Critical Care Time devoted to patient care services described in this note is 35 minutes.   Lonia Farber, MD Pulmonary and Critical Care Medicine [facility] Pager: [phone]  01/14/2014, 7:28 AM

## 2014-01-14 NOTE — Procedures (Signed)
Procedure:  CT guided drainage of peritoneal abscess Findings:  Pelvic fluid approached from right transgluteal approach.  Aspiration yielded turbid fluid.  12 Fr percutaneous drain placed and attached to suction bulb.

## 2014-01-14 NOTE — Progress Notes (Signed)
Increasing WBC in spite of broad antibiotic coverage Pelvic fluid collection/ LUQ fluid collection questionable ID consult IR to attempt aspiration/ drainage of fluid collections.Wilmon Arms. Corliss Skains, MD, [facility] Surgery  General/ Trauma Surgery  01/14/2014 10:33 AM

## 2014-01-14 NOTE — Progress Notes (Signed)
Patient ID: Jesus Hunter, male   DOB: 11-Nov-1970, 43 y.o.   MRN: [id number] Request received for CT guided aspiration/possible drainage of pelvic fluid collection in pt s/p appendectomy 01/05/14 for perforated gangrenous appendicitis; now with increasing WBC, tachycardia/tachypnea, fever dsepite antbx therapy. F/u CT reveals prominent fluid collections  in the pericholecystic/ right subhepatic region, left subdiaphragmatic region, and in the  pelvis. Images have been reviewed by Dr. Fredia Sorrow. Additional PMH as below. Exam: pt awake/alert; NG in place;chest- dim BS bases, scatt rales; heart -tachy but regular rhythm; abd- soft,mildly dist., diffusely tender,few BS, intact rt abd /surgical drain ext- no edema.    Filed Vitals:   01/14/14 0800 01/14/14 0900 01/14/14 1000 01/14/14 1144  BP: 164/103 183/103 167/97   Pulse: 126 124 138   Temp:    99.2 F (37.3 C)  TempSrc:    Oral  Resp: 30 31 35   Height:      Weight:      SpO2: 94% 93% 92%    Past Medical History  Diagnosis Date  . Hypertension   . High cholesterol    Past Surgical History  Procedure Laterality Date  . Appendectomy  01/05/2014    ruptured/notes 01/05/2014  . Laparoscopic appendectomy N/A 01/05/2014    Procedure: APPENDECTOMY LAPAROSCOPIC;  Surgeon: [name], MD;  Location: [facility] OR;  Service: General;  Laterality: N/A;  Dg Chest 2 View  01/07/2014   CLINICAL DATA:  Postoperative fever.  Abdominal pain.  EXAM: CHEST  2 VIEW  COMPARISON:  CT abdomen and pelvis 01/05/2014.  FINDINGS: The heart size is exaggerated by low lung volumes. Bibasilar airspace disease is present. Bilateral pleural effusions are noted. Pulmonary vascular congestion is evident. Obstructed bowel is evident.  IMPRESSION: 1. Bibasilar airspace disease with air bronchograms concerning for bilateral pneumonia or aspiration. 2. Bilateral pleural effusions and pulmonary vascular congestion.   Electronically Signed   By: Gennette Pac M.D.   On: 01/07/2014 21:04    Dg Abd 1 View  01/10/2014   CLINICAL DATA:  Nasogastric tube placement.  EXAM: ABDOMEN - 1 VIEW  COMPARISON:  Abdominal CT from yesterday  FINDINGS: Gastric suction tube tip is at the level of the pylorus. Persistent dilatation of small bowel, measuring up to 7 cm in diameter. Previously administered oral contrast has progressed distally, visible currently in the descending colon. Surgical drain again noted in the right lower quadrant.  IMPRESSION: 1. Nasogastric tube tip at the pylorus. 2. Unchanged small bowel dilatation.   Electronically Signed   By: Tiburcio Pea M.D.   On: 01/10/2014 13:38   Ct Chest W Contrast  01/13/2014   CLINICAL DATA:  Ruptured appendix.  Sepsis.  EXAM: CT CHEST WITH CONTRAST  CT ABDOMEN AND PELVIS WITH CONTRAST  TECHNIQUE: Multidetector CT imaging of the chest was performed during intravenous contrast administration. Multidetector CT imaging of the abdomen and pelvis was performed following the standard protocol before and during bolus administration of intravenous contrast.  CONTRAST:  80mL OMNIPAQUE IOHEXOL 300 MG/ML  SOLN  COMPARISON:  Chest x-ray 01/13/2014 and 01/10/2014. CT abdomen pelvis 01/09/2014.  FINDINGS: CT CHEST FINDINGS  Thoracic aorta normal in caliber. No aneurysm. Heart size normal. Left IJ catheter noted with its tip in the lower superior vena cava. NG tube noted with tip in the stomach.  Shotty mediastinal lymph nodes.  Esophagus is nondistended.  Large airways are patent. Severe dense diffuse pulmonary infiltrates are present with this basilar atelectasis and moderate bilateral pleural effusions. The prominent  dense patchy bilateral pulmonary infiltrates are most consistent with pneumonia. Process such as pulmonary edema/ARDS cannot be excluded.  Thyroid is unremarkable. No significant axillary adenopathy. No acute bony abnormality identified.  CT ABDOMEN AND PELVIS FINDINGS  No focal hepatic abnormality. Spleen is unremarkable. Pancreas is unremarkable. No  biliary distention. Gallbladder is nondistended. A pericholecystic/subhepatic fluid collection is present. Maximum diameter is 3 cm. This could represent ascites. A phlegmon and/or abscess cannot be excluded. Left subdiaphragmatic fluid collections are noted. These measure up to 3.5 cm. This could represent loculated ascites. Seven Left medic phlegmon and/or abscess abscess on the left cannot be excluded. No air noted in these fluid collections. 5.3 cm fluid collection in the pelvis. This could represent ascites, however pelvic abscess cannot be excluded. No air noted within this fluid collection.  Adrenals are unremarkable. Symmetric bilateral renal perfusion. No focal renal abnormality. No evidence of hydronephrosis. The bladder is nondistended. Prostate is not enlarged.  No significant inguinal or retroperitoneal adenopathy. Abdominal aorta normal in caliber.  Right lower quadrant drain catheter is noted. No evidence of right lower quadrant abscess. Inflammatory changes in the right lower quadrant noted consistent with prior appendicitis. Prominent thickening of small and large bowel walls. These changes may be reactive. Enterocolitis could present in this fashion. Ischemic bowel cannot be excluded. Stomach is nondistended. No evidence of free air. No abdominal wall significant hernia.  No acute bony abnormality identified.  IMPRESSION: 1. Severe diffuse dense bilateral pulmonary infiltrates most consistent severe bilateral pneumonia. Dense bibasilar atelectasis and moderate bilateral pleural effusions are present. 2. Prominent fluid collections are noted in the pericholecystic/ right subhepatic region, left subdiaphragmatic region, and in the pelvis. These could represent loculated regions of ascites however phlegmon/ developing abscess abscess in these regions cannot be excluded. 3. Severe thickened small large bowel wall. Although these changes may be reactive, enterocolitis or ischemic bowel cannot be  excluded. 4. Right lower quadrant drainage catheter is again noted. No prominent fluid collections in this region. Adjacent inflammatory changes are present from patient's prior appendicitis.   Electronically Signed   By: Maisie Fus  Register   On: 01/13/2014 13:11   Ct Abdomen Pelvis W Contrast  01/13/2014   CLINICAL DATA:  Ruptured appendix.  Sepsis.  EXAM: CT CHEST WITH CONTRAST  CT ABDOMEN AND PELVIS WITH CONTRAST  TECHNIQUE: Multidetector CT imaging of the chest was performed during intravenous contrast administration. Multidetector CT imaging of the abdomen and pelvis was performed following the standard protocol before and during bolus administration of intravenous contrast.  CONTRAST:  80mL OMNIPAQUE IOHEXOL 300 MG/ML  SOLN  COMPARISON:  Chest x-ray 01/13/2014 and 01/10/2014. CT abdomen pelvis 01/09/2014.  FINDINGS: CT CHEST FINDINGS  Thoracic aorta normal in caliber. No aneurysm. Heart size normal. Left IJ catheter noted with its tip in the lower superior vena cava. NG tube noted with tip in the stomach.  Shotty mediastinal lymph nodes.  Esophagus is nondistended.  Large airways are patent. Severe dense diffuse pulmonary infiltrates are present with this basilar atelectasis and moderate bilateral pleural effusions. The prominent dense patchy bilateral pulmonary infiltrates are most consistent with pneumonia. Process such as pulmonary edema/ARDS cannot be excluded.  Thyroid is unremarkable. No significant axillary adenopathy. No acute bony abnormality identified.  CT ABDOMEN AND PELVIS FINDINGS  No focal hepatic abnormality. Spleen is unremarkable. Pancreas is unremarkable. No biliary distention. Gallbladder is nondistended. A pericholecystic/subhepatic fluid collection is present. Maximum diameter is 3 cm. This could represent ascites. A phlegmon and/or abscess cannot be excluded.  Left subdiaphragmatic fluid collections are noted. These measure up to 3.5 cm. This could represent loculated ascites. Seven Left  medic phlegmon and/or abscess abscess on the left cannot be excluded. No air noted in these fluid collections. 5.3 cm fluid collection in the pelvis. This could represent ascites, however pelvic abscess cannot be excluded. No air noted within this fluid collection.  Adrenals are unremarkable. Symmetric bilateral renal perfusion. No focal renal abnormality. No evidence of hydronephrosis. The bladder is nondistended. Prostate is not enlarged.  No significant inguinal or retroperitoneal adenopathy. Abdominal aorta normal in caliber.  Right lower quadrant drain catheter is noted. No evidence of right lower quadrant abscess. Inflammatory changes in the right lower quadrant noted consistent with prior appendicitis. Prominent thickening of small and large bowel walls. These changes may be reactive. Enterocolitis could present in this fashion. Ischemic bowel cannot be excluded. Stomach is nondistended. No evidence of free air. No abdominal wall significant hernia.  No acute bony abnormality identified.  IMPRESSION: 1. Severe diffuse dense bilateral pulmonary infiltrates most consistent severe bilateral pneumonia. Dense bibasilar atelectasis and moderate bilateral pleural effusions are present. 2. Prominent fluid collections are noted in the pericholecystic/ right subhepatic region, left subdiaphragmatic region, and in the pelvis. These could represent loculated regions of ascites however phlegmon/ developing abscess abscess in these regions cannot be excluded. 3. Severe thickened small large bowel wall. Although these changes may be reactive, enterocolitis or ischemic bowel cannot be excluded. 4. Right lower quadrant drainage catheter is again noted. No prominent fluid collections in this region. Adjacent inflammatory changes are present from patient's prior appendicitis.   Electronically Signed   By: Maisie Fus  Register   On: 01/13/2014 13:11   Ct Abdomen Pelvis W Contrast  01/09/2014   CLINICAL DATA:  Ruptured  appendicitis 3 days ago now with increasing abdominal distention.  EXAM: CT ABDOMEN AND PELVIS WITH CONTRAST  TECHNIQUE: Multidetector CT imaging of the abdomen and pelvis was performed using the standard protocol following bolus administration of intravenous contrast.  CONTRAST:  OMNIPAQUE IOHEXOL 300 MG/ML SOLN intravenously ; the patient also received oral contrast material.  COMPARISON:  Abdominal pelvic CT scan of January 05, 2014  FINDINGS: Contrast is present in the stomach and proximal and mid small bowel. The small bowel loops are mildly distended with gas and the contrast. There is a loop of unopacified distal small bowel on images 61-82 position superior and medial to the drainage catheter. There is a small amount of increased density within the adjacent extraluminal soft tissues demonstrated on images 63 through 69. This likely reflects a suture line though a small amount of extraluminal contrast is not absolutely excluded. Small amounts of low density interloop fluid persists. There is no discrete drainable abscess.  There is contrast in the colon likely remaining from the previous CT scan. There is mild thickening of the colonic wall of the ascending colon and rectosigmoid without adjacent abscess formation.  The liver, gallbladder, pancreas, spleen, adrenal glands, and kidneys exhibit no acute abnormalities. There is a moderate amount of fluid along the inferior surface of the liver with a smaller amount in the paracolic gutter. The stomach contains a nasogastric tube. There is a small amount of free fluid in the pelvis. The urinary bladder is partially distended and contains a Foley catheter. The lung bases exhibit parenchymal consolidation posteriorly consistent with pneumonia.  IMPRESSION: 1. There is a small bowel ileus. There remains interloop fluid as well as considerable fluid in the subhepatic region. A drainage  catheter is in place in the right lower abdomen and upper pelvis. A small  amount of radiodense material adjacent to the drainage catheter on images 64-66 likely reflects suture, but a tiny amount of extraluminal contrast is not excluded. No extraluminal contrast collections are demonstrated elsewhere. There is no no new discrete drainable abscess. 2. The small bowel gas pattern is consistent with an ileus. There is a loop of thick-walled un-opacified distal small bowel in the right lower quadrant. 3. The gallbladder is mildly distended without evidence of stones. 4. There is bibasilar pneumonia.   Electronically Signed   By: David  [place]   On: 01/09/2014 11:47   Ct Abdomen Pelvis W Contrast  01/05/2014   CLINICAL DATA:  Right lower quadrant abdominal pain and fever.  EXAM: CT ABDOMEN AND PELVIS WITH CONTRAST  TECHNIQUE: Multidetector CT imaging of the abdomen and pelvis was performed using the standard protocol following bolus administration of intravenous contrast.  CONTRAST:  OMNIPAQUE IOHEXOL 300 MG/ML  SOLN  COMPARISON:  None.  FINDINGS: Moderate bibasilar airspace disease is present. There is some ground-glass attenuation in the right middle lobe as well. The heart size is normal. There is no significant pleural or pericardial effusion.  Liver and spleen are within normal limits. Small hiatal hernia is present. The stomach, duodenum, and pancreas are otherwise within normal limits. Common bile duct and gallbladder are normal. The adrenal glands are normal bilaterally. Kidneys and ureters are unremarkable.  An 8 mm appendicolith is present. The appendix is markedly enlarged inflamed. There is evidence for rupture with free fluid in free air layering in the right lower quadrant. There is marked secondary inflammatory change of the distal small bowel loops. Additional or fluid may be an capsulated extending into the pelvis. The colon is fluid-filled.  The urinary bladder is within normal limits.  The bone windows demonstrate no focal lytic or blastic lesions.  IMPRESSION: 1.  Ruptured appendicitis with scattered air-fluid levels in the right lower quadrant. 2. Potentially encapsulated fluid extending from the lower right lower quadrant into the pelvis. 3. Marked secondary inflammatory changes of the distal small bowel. 4. Bibasilar airspace disease likely reflects atelectasis.   Electronically Signed   By: Gennette Pac M.D.   On: 01/05/2014 13:45   Dg Chest Port 1 View  01/14/2014   CLINICAL DATA:  Follow-up infiltrates  EXAM: PORTABLE CHEST - 1 VIEW  COMPARISON:  CT chest dated 01/13/2014  FINDINGS: Multifocal patchy opacities, right lung predominant, suspicious for multifocal pneumonia. Small bilateral pleural effusions. No pneumothorax.  Mild cardiomegaly.  Left IJ venous catheter terminating at the cavoatrial junction. Enteric tube terminating in the proximal duodenum.  IMPRESSION: Multifocal patchy opacities, right lung predominant, suspicious for pneumonia.  Small bilateral pleural effusions.   Electronically Signed   By: Charline Bills M.D.   On: 01/14/2014 07:12   Dg Chest Port 1 View  01/13/2014   CLINICAL DATA:  Fever, short of breath ?pneumonia  EXAM: PORTABLE CHEST - 1 VIEW  COMPARISON:  01/10/2014  FINDINGS: Increased patchy airspace opacity along the periphery right lung. Decreased retrocardiac opacity with some residual Hypoaeration. Interstitial and vascular crowding. Elevated right hemidiaphragm. Right pleural effusion not excluded. Left IJ central venous catheter tip projects over the cavoatrial junction. NG tube descends over the distal stomach.  IMPRESSION: Hypoaeration and bibasilar opacities. Increased peripheral right lung opacity, may reflect atelectasis and/or pneumonia.   Electronically Signed   By: Jearld Lesch M.D.   On: 01/13/2014 04:47  Dg Chest Port 1 View  01/10/2014   CLINICAL DATA:  Sepsis.  Respiratory distress.  EXAM: PORTABLE CHEST - 1 VIEW  COMPARISON:  Single view of the chest 01/09/2014 and 01/08/2014.  FINDINGS: Support tubes and  lines are unchanged. Lung volumes are low with basilar airspace opacities, worse on the left. No pneumothorax is identified. Heart size is normal.  IMPRESSION: Left worse than right basilar airspace opacities are likely due to atelectasis in this low volume chest. The appearance is not markedly changed.   Electronically Signed   By: Drusilla Kanner M.D.   On: 01/10/2014 07:30   Dg Chest Port 1 View  01/09/2014   CLINICAL DATA:  Ventilator dependence.  EXAM: PORTABLE CHEST - 1 VIEW  COMPARISON:  01/08/2014  FINDINGS: 0512 hrs. Lung volumes are low. Endotracheal tube tip is approximately 2.0 cm above the base of the chronic. A left IJ central line remains in place with tip position in the upper to mid right atrium. NG tube is looped in the stomach with the tip overlying the fundus. Pulmonary vascular congestion persists with basilar atelectasis and small bilateral pleural effusions. Gaseous dilatation of small bowel in the left abdomen does not appear substantially changed in the interval.  IMPRESSION: Low volume film with vascular congestion and bibasilar atelectasis. Small bilateral pleural effusions persist.  Gaseous small bowel dilatation in the visualized left upper abdomen.   Electronically Signed   By: Kennith Center M.D.   On: 01/09/2014 07:02   Dg Chest Port 1 View  01/08/2014   CLINICAL DATA:  Intubation.  EXAM: PORTABLE CHEST - 1 VIEW  COMPARISON:  01/08/2014.  FINDINGS: Endotracheal tube noted 2.9 cm above the carina. Left IJ line noted with tip in right atrium. NG tube noted coiled in stomach. Cardiomegaly with mild pulmonary vascular prominence. Poor inspiration with prominent bibasilar atelectasis. Small pleural effusions. No pneumothorax. No acute bony abnormality. Distended loops of bowel are noted.  IMPRESSION: 1. Endotracheal tube noted with its tip 2.9 cm above the carina. Left IJ line noted with its tip in the right atrium. NG tube noted coiled in stomach. 2. Cardiomegaly with mild pulmonary  vascular prominence. 3. Poor inspiration with prominent bibasilar atelectasis. Small pleural effusions. 4. Bowel distention noted. Abdominal series can be obtained to further evaluate as needed.   Electronically Signed   By: Maisie Fus  Register   On: 01/08/2014 18:10   Dg Chest Port 1 View  01/08/2014   CLINICAL DATA:  Shortness of breath. Productive cough, hypertension.  EXAM: PORTABLE CHEST - 1 VIEW  COMPARISON:  01/07/2014  FINDINGS: Low lung volumes with bibasilar opacities, stable. Mild cardiomegaly. Vascular congestion. Possible small bilateral effusions. No real change since prior study.  IMPRESSION: Low lung volumes with continued bibasilar opacities. Cannot exclude pneumonia. Small bilateral effusions. No change.   Electronically Signed   By: Charlett Nose M.D.   On: 01/08/2014 16:31   Dg Abd 2 Views  01/07/2014   CLINICAL DATA:  Distended bowel.  EXAM: ABDOMEN - 2 VIEW  COMPARISON:  01/05/2014 CT  FINDINGS: Gaseous distention of small bowel loops as well as the transverse colon and stomach. Contrast within decompressed descending colon and sigmoid colon. No free air organomegaly. Surgical drain in the right lower quadrant. No acute bony abnormality.  IMPRESSION: Nonspecific bowel gas pattern with gaseous distention of the stomach, small bowel and a transverse colon. Favor ileus. Small bowel dilatation has increased since prior study.   Electronically Signed   By: Caryn Bee  [place] M.D.   On: 01/07/2014 21:13   Dg Abd Portable 1v  01/13/2014   CLINICAL DATA:  Abdominal pain, recent appendectomy.  EXAM: PORTABLE ABDOMEN - 1 VIEW  COMPARISON:  01/10/2014  FINDINGS: NG tube tip projects over the distal stomach. There is contrast within colon. Nonspecific small bowel loop dilatation in the left upper quadrant. Surgical drain right lower quadrant and surgical clips in keeping with recent history of appendectomy.  IMPRESSION: NG tube tip projects over the distal stomach.  Postsurgical changes of recent  appendectomy with drain in place.  Dilated loop of small bowel loops in the left upper abdomen is nonspecific and may reflect ileus. There is air and previously ingested contrast within normal caliber colon.   Electronically Signed   By: Jearld Lesch M.D.   On: 01/13/2014 04:45   Dg Abd Portable 1v  01/08/2014   CLINICAL DATA:  Generalized abdominal pain.  EXAM: PORTABLE ABDOMEN - 1 VIEW  COMPARISON:  01/07/2014  FINDINGS: Right lower quadrant drain remains in place. Continued gaseous distention of bowel, similar to prior study. This is predominately stomach and small bowel. No change since prior study. No free air.  IMPRESSION: Stable gaseous distention of stomach and small bowel. This is presumably ileus although small bowel obstruction cannot be completely excluded.   Electronically Signed   By: Charlett Nose M.D.   On: 01/08/2014 16:46  Results for orders placed during the hospital encounter of 01/05/14  BODY FLUID CULTURE      Result Value Ref Range   Specimen Description PERITONEAL FLUID     Special Requests PATIENT ON FOLLOWING ZOSYN RECEIVED SWAB     Gram Stain       Value: NO WBC SEEN     ABUNDANT GRAM NEGATIVE RODS     ABUNDANT GRAM POSITIVE RODS     MODERATE GRAM POSITIVE COCCI     IN PAIRS Gram Stain Report Called to,Read Back By and Verified With: Gram Stain Report Called to,Read Back By and Verified With: Purcell Nails RN 01/06/14 0740AM BY MILSH   Culture       Value: MULTIPLE ORGANISMS PRESENT, NONE PREDOMINANT     Performed at [facility]   Report Status 01/07/2014 FINAL    ANAEROBIC CULTURE      Result Value Ref Range   Specimen Description PERITONEAL FLUID     Special Requests PATIENT ON FOLLOWING ZOSYN RECEIVED SWAB     Gram Stain       Value: NO WBC SEEN     ABUNDANT GRAM NEGATIVE RODS     ABUNDANT GRAM POSITIVE RODS     MODERATE GRAM POSITIVE COCCI     IN PAIRS   Culture       Value: NO ANAEROBES ISOLATED     Performed at [facility]   Report  Status 01/10/2014 FINAL    CULTURE, BLOOD (ROUTINE X 2)      Result Value Ref Range   Specimen Description BLOOD LEFT ARM     Special Requests BOTTLES DRAWN AEROBIC ONLY 5CC     Culture  Setup Time       Value: 01/08/2014 18:43     Performed at [facility]   Culture       Value: NO GROWTH 5 DAYS     Performed at [facility]   Report Status 01/14/2014 FINAL    CULTURE, BLOOD (ROUTINE X 2)      Result Value Ref Range  Specimen Description BLOOD LEFT HAND     Special Requests BOTTLES DRAWN AEROBIC ONLY 8CC     Culture  Setup Time       Value: 01/08/2014 18:44     Performed at [facility]   Culture       Value: NO GROWTH 5 DAYS     Performed at [facility]   Report Status 01/14/2014 FINAL    MRSA PCR SCREENING      Result Value Ref Range   MRSA by PCR NEGATIVE  NEGATIVE  CBC WITH DIFFERENTIAL      Result Value Ref Range   WBC 8.0  4.0 - 10.5 K/uL   RBC 5.49  4.22 - 5.81 MIL/uL   Hemoglobin 15.8  13.0 - 17.0 g/dL   HCT 16.1  09.6 - 04.5 %   MCV 80.0  78.0 - 100.0 fL   MCH 28.8  26.0 - 34.0 pg   MCHC 36.0  30.0 - 36.0 g/dL   RDW 40.9  81.1 - 91.4 %   Platelets 173  150 - 400 K/uL   Neutrophils Relative % 81 (*) 43 - 77 %   Lymphocytes Relative 11 (*) 12 - 46 %   Monocytes Relative 8  3 - 12 %   Eosinophils Relative 0  0 - 5 %   Basophils Relative 0  0 - 1 %   Neutro Abs 6.5  1.7 - 7.7 K/uL   Lymphs Abs 0.9  0.7 - 4.0 K/uL   Monocytes Absolute 0.6  0.1 - 1.0 K/uL   Eosinophils Absolute 0.0  0.0 - 0.7 K/uL   Basophils Absolute 0.0  0.0 - 0.1 K/uL   WBC Morphology DOHLE BODIES    COMPREHENSIVE METABOLIC PANEL      Result Value Ref Range   Sodium 132 (*) 137 - 147 mEq/L   Potassium 3.5 (*) 3.7 - 5.3 mEq/L   Chloride 90 (*) 96 - 112 mEq/L   CO2 23  19 - 32 mEq/L   Glucose, Bld 117 (*) 70 - 99 mg/dL   BUN 28 (*) 6 - 23 mg/dL   Creatinine, Ser 7.82  0.50 - 1.35 mg/dL   Calcium 8.6  8.4 - 95.6 mg/dL   Total Protein 8.1  6.0 - 8.3 g/dL    Albumin 3.1 (*) 3.5 - 5.2 g/dL   AST 19  0 - 37 U/L   ALT 20  0 - 53 U/L   Alkaline Phosphatase 40  39 - 117 U/L   Total Bilirubin 1.5 (*) 0.3 - 1.2 mg/dL   GFR calc non Af Amer 69 (*) >90 mL/min   GFR calc Af Amer 80 (*) >90 mL/min   Anion gap 19 (*) 5 - 15  URINALYSIS, ROUTINE W REFLEX MICROSCOPIC      Result Value Ref Range   Color, Urine AMBER (*) YELLOW   APPearance CLEAR  CLEAR   Specific Gravity, Urine 1.020  1.005 - 1.030   pH 6.0  5.0 - 8.0   Glucose, UA NEGATIVE  NEGATIVE mg/dL   Hgb urine dipstick LARGE (*) NEGATIVE   Bilirubin Urine SMALL (*) NEGATIVE   Ketones, ur 15 (*) NEGATIVE mg/dL   Protein, ur 213 (*) NEGATIVE mg/dL   Urobilinogen, UA 0.2  0.0 - 1.0 mg/dL   Nitrite NEGATIVE  NEGATIVE   Leukocytes, UA TRACE (*) NEGATIVE  URINE MICROSCOPIC-ADD ON      Result Value Ref Range   Squamous Epithelial / LPF RARE  RARE   WBC, UA 0-2  <3 WBC/hpf   RBC / HPF 0-2  <3 RBC/hpf   Bacteria, UA RARE  RARE   Casts GRANULAR CAST (*) NEGATIVE   Urine-Other AMORPHOUS URATES/PHOSPHATES    BASIC METABOLIC PANEL      Result Value Ref Range   Sodium 132 (*) 137 - 147 mEq/L   Potassium 4.7  3.7 - 5.3 mEq/L   Chloride 95 (*) 96 - 112 mEq/L   CO2 23  19 - 32 mEq/L   Glucose, Bld 118 (*) 70 - 99 mg/dL   BUN 24 (*) 6 - 23 mg/dL   Creatinine, Ser 4.09 (*) 0.50 - 1.35 mg/dL   Calcium 7.6 (*) 8.4 - 10.5 mg/dL   GFR calc non Af Amer 52 (*) >90 mL/min   GFR calc Af Amer 60 (*) >90 mL/min   Anion gap 14  5 - 15  CBC      Result Value Ref Range   WBC 7.1  4.0 - 10.5 K/uL   RBC 5.24  4.22 - 5.81 MIL/uL   Hemoglobin 14.5  13.0 - 17.0 g/dL   HCT 81.1  91.4 - 78.2 %   MCV 81.9  78.0 - 100.0 fL   MCH 27.7  26.0 - 34.0 pg   MCHC 33.8  30.0 - 36.0 g/dL   RDW 95.6  21.3 - 08.6 %   Platelets 123 (*) 150 - 400 K/uL  BASIC METABOLIC PANEL      Result Value Ref Range   Sodium 133 (*) 137 - 147 mEq/L   Potassium 4.2  3.7 - 5.3 mEq/L   Chloride 97  96 - 112 mEq/L   CO2 24  19 - 32 mEq/L    Glucose, Bld 134 (*) 70 - 99 mg/dL   BUN 22  6 - 23 mg/dL   Creatinine, Ser 5.78  0.50 - 1.35 mg/dL   Calcium 7.6 (*) 8.4 - 10.5 mg/dL   GFR calc non Af Amer 67 (*) >90 mL/min   GFR calc Af Amer 77 (*) >90 mL/min   Anion gap 12  5 - 15  CBC WITH DIFFERENTIAL      Result Value Ref Range   WBC 11.3 (*) 4.0 - 10.5 K/uL   RBC 4.51  4.22 - 5.81 MIL/uL   Hemoglobin 12.6 (*) 13.0 - 17.0 g/dL   HCT 46.9 (*) 62.9 - 52.8 %   MCV 80.7  78.0 - 100.0 fL   MCH 27.9  26.0 - 34.0 pg   MCHC 34.6  30.0 - 36.0 g/dL   RDW 41.3  24.4 - 01.0 %   Platelets 117 (*) 150 - 400 K/uL   Neutrophils Relative % 74  43 - 77 %   Lymphocytes Relative 12  12 - 46 %   Monocytes Relative 14 (*) 3 - 12 %   Eosinophils Relative 0  0 - 5 %   Basophils Relative 0  0 - 1 %   Neutro Abs 8.3 (*) 1.7 - 7.7 K/uL   Lymphs Abs 1.4  0.7 - 4.0 K/uL   Monocytes Absolute 1.6 (*) 0.1 - 1.0 K/uL   Eosinophils Absolute 0.0  0.0 - 0.7 K/uL   Basophils Absolute 0.0  0.0 - 0.1 K/uL   WBC Morphology INCREASED BANDS (>20% BANDS)    TROPONIN I      Result Value Ref Range   Troponin I <0.30  <0.30 ng/mL  CK TOTAL AND CKMB  Result Value Ref Range   Total CK 135  7 - 232 U/L   CK, MB 2.5  0.3 - 4.0 ng/mL   Relative Index 1.9  0.0 - 2.5  PRO B NATRIURETIC PEPTIDE      Result Value Ref Range   Pro B Natriuretic peptide (BNP) 334.9 (*) 0 - 125 pg/mL  TROPONIN I      Result Value Ref Range   Troponin I <0.30  <0.30 ng/mL  TROPONIN I      Result Value Ref Range   Troponin I <0.30  <0.30 ng/mL  BLOOD GAS, ARTERIAL      Result Value Ref Range   O2 Content 4.0     Delivery systems NASAL CANNULA     pH, Arterial 7.436  7.350 - 7.450   pCO2 arterial 37.4  35.0 - 45.0 mmHg   pO2, Arterial 59.6 (*) 80.0 - 100.0 mmHg   Bicarbonate 24.7 (*) 20.0 - 24.0 mEq/L   TCO2 25.9  0 - 100 mmol/L   Acid-Base Excess 1.0  0.0 - 2.0 mmol/L   O2 Saturation 93.0     Patient temperature 98.6     Collection site RIGHT RADIAL     Drawn by [id number]      Sample type ARTERIAL DRAW     Allens test (pass/fail) PASS  PASS  COMPREHENSIVE METABOLIC PANEL      Result Value Ref Range   Sodium 134 (*) 137 - 147 mEq/L   Potassium 3.8  3.7 - 5.3 mEq/L   Chloride 95 (*) 96 - 112 mEq/L   CO2 22  19 - 32 mEq/L   Glucose, Bld 131 (*) 70 - 99 mg/dL   BUN 25 (*) 6 - 23 mg/dL   Creatinine, Ser 2.13  0.50 - 1.35 mg/dL   Calcium 8.4  8.4 - 08.6 mg/dL   Total Protein 6.9  6.0 - 8.3 g/dL   Albumin 2.0 (*) 3.5 - 5.2 g/dL   AST 49 (*) 0 - 37 U/L   ALT 29  0 - 53 U/L   Alkaline Phosphatase 59  39 - 117 U/L   Total Bilirubin 1.2  0.3 - 1.2 mg/dL   GFR calc non Af Amer >90  >90 mL/min   GFR calc Af Amer >90  >90 mL/min   Anion gap 17 (*) 5 - 15  CBC      Result Value Ref Range   WBC 18.5 (*) 4.0 - 10.5 K/uL   RBC 4.80  4.22 - 5.81 MIL/uL   Hemoglobin 13.3  13.0 - 17.0 g/dL   HCT 57.8 (*) 46.9 - 62.9 %   MCV 79.4  78.0 - 100.0 fL   MCH 27.7  26.0 - 34.0 pg   MCHC 34.9  30.0 - 36.0 g/dL   RDW 52.8  41.3 - 24.4 %   Platelets 188  150 - 400 K/uL  LACTIC ACID, PLASMA      Result Value Ref Range   Lactic Acid, Venous 1.9  0.5 - 2.2 mmol/L  MAGNESIUM      Result Value Ref Range   Magnesium 2.2  1.5 - 2.5 mg/dL  PHOSPHORUS      Result Value Ref Range   Phosphorus 1.6 (*) 2.3 - 4.6 mg/dL  BLOOD GAS, ARTERIAL      Result Value Ref Range   FIO2 0.40     Delivery systems VENTILATOR     Mode PRESSURE REGULATED VOLUME CONTROL     VT 440  Rate 18     Peep/cpap 5.0     pH, Arterial 7.377  7.350 - 7.450   pCO2 arterial 49.8 (*) 35.0 - 45.0 mmHg   pO2, Arterial 106.0 (*) 80.0 - 100.0 mmHg   Bicarbonate 28.3 (*) 20.0 - 24.0 mEq/L   TCO2 29.7  0 - 100 mmol/L   Acid-Base Excess 3.7 (*) 0.0 - 2.0 mmol/L   O2 Saturation 98.2     Patient temperature 100.1     Collection site RIGHT RADIAL     Drawn by [id number]     Sample type ARTERIAL DRAW     Allens test (pass/fail) PASS  PASS  COMPREHENSIVE METABOLIC PANEL      Result Value Ref Range   Sodium 138   137 - 147 mEq/L   Potassium 3.7  3.7 - 5.3 mEq/L   Chloride 100  96 - 112 mEq/L   CO2 27  19 - 32 mEq/L   Glucose, Bld 98  70 - 99 mg/dL   BUN 25 (*) 6 - 23 mg/dL   Creatinine, Ser 6.04  0.50 - 1.35 mg/dL   Calcium 7.6 (*) 8.4 - 10.5 mg/dL   Total Protein 5.9 (*) 6.0 - 8.3 g/dL   Albumin 1.8 (*) 3.5 - 5.2 g/dL   AST 31  0 - 37 U/L   ALT 25  0 - 53 U/L   Alkaline Phosphatase 46  39 - 117 U/L   Total Bilirubin 1.3 (*) 0.3 - 1.2 mg/dL   GFR calc non Af Amer 81 (*) >90 mL/min   GFR calc Af Amer >90  >90 mL/min   Anion gap 11  5 - 15  GLUCOSE, CAPILLARY      Result Value Ref Range   Glucose-Capillary 114 (*) 70 - 99 mg/dL  CBC      Result Value Ref Range   WBC 15.1 (*) 4.0 - 10.5 K/uL   RBC 4.11 (*) 4.22 - 5.81 MIL/uL   Hemoglobin 11.2 (*) 13.0 - 17.0 g/dL   HCT 54.0 (*) 98.1 - 19.1 %   MCV 81.8  78.0 - 100.0 fL   MCH 27.3  26.0 - 34.0 pg   MCHC 33.3  30.0 - 36.0 g/dL   RDW 47.8  29.5 - 62.1 %   Platelets 193  150 - 400 K/uL  MAGNESIUM      Result Value Ref Range   Magnesium 2.5  1.5 - 2.5 mg/dL  PHOSPHORUS      Result Value Ref Range   Phosphorus 1.7 (*) 2.3 - 4.6 mg/dL  TROPONIN I      Result Value Ref Range   Troponin I <0.30  <0.30 ng/mL  TROPONIN I      Result Value Ref Range   Troponin I <0.30  <0.30 ng/mL  GLUCOSE, CAPILLARY      Result Value Ref Range   Glucose-Capillary 112 (*) 70 - 99 mg/dL  GLUCOSE, CAPILLARY      Result Value Ref Range   Glucose-Capillary 99  70 - 99 mg/dL  PHOSPHORUS      Result Value Ref Range   Phosphorus 2.4  2.3 - 4.6 mg/dL  COMPREHENSIVE METABOLIC PANEL      Result Value Ref Range   Sodium 137  137 - 147 mEq/L   Potassium 3.9  3.7 - 5.3 mEq/L   Chloride 102  96 - 112 mEq/L   CO2 26  19 - 32 mEq/L   Glucose, Bld 111 (*) 70 - 99  mg/dL   BUN 24 (*) 6 - 23 mg/dL   Creatinine, Ser 1.61  0.50 - 1.35 mg/dL   Calcium 7.1 (*) 8.4 - 10.5 mg/dL   Total Protein 5.4 (*) 6.0 - 8.3 g/dL   Albumin 1.5 (*) 3.5 - 5.2 g/dL   AST 39 (*) 0 - 37  U/L   ALT 30  0 - 53 U/L   Alkaline Phosphatase 58  39 - 117 U/L   Total Bilirubin 1.9 (*) 0.3 - 1.2 mg/dL   GFR calc non Af Amer >90  >90 mL/min   GFR calc Af Amer >90  >90 mL/min   Anion gap 9  5 - 15  MAGNESIUM      Result Value Ref Range   Magnesium 2.3  1.5 - 2.5 mg/dL  PREALBUMIN      Result Value Ref Range   Prealbumin 8.1 (*) 17.0 - 34.0 mg/dL  CBC      Result Value Ref Range   WBC 14.5 (*) 4.0 - 10.5 K/uL   RBC 3.95 (*) 4.22 - 5.81 MIL/uL   Hemoglobin 10.8 (*) 13.0 - 17.0 g/dL   HCT 09.6 (*) 04.5 - 40.9 %   MCV 81.3  78.0 - 100.0 fL   MCH 27.3  26.0 - 34.0 pg   MCHC 33.6  30.0 - 36.0 g/dL   RDW 81.1  91.4 - 78.2 %   Platelets 240  150 - 400 K/uL  GLUCOSE, CAPILLARY      Result Value Ref Range   Glucose-Capillary 136 (*) 70 - 99 mg/dL  TRIGLYCERIDES      Result Value Ref Range   Triglycerides 235 (*) <150 mg/dL  GLUCOSE, CAPILLARY      Result Value Ref Range   Glucose-Capillary 119 (*) 70 - 99 mg/dL  GLUCOSE, CAPILLARY      Result Value Ref Range   Glucose-Capillary 124 (*) 70 - 99 mg/dL   Comment 1 Documented in Chart     Comment 2 Notify RN    GLUCOSE, CAPILLARY      Result Value Ref Range   Glucose-Capillary 110 (*) 70 - 99 mg/dL  BASIC METABOLIC PANEL      Result Value Ref Range   Sodium 137  137 - 147 mEq/L   Potassium 3.7  3.7 - 5.3 mEq/L   Chloride 101  96 - 112 mEq/L   CO2 25  19 - 32 mEq/L   Glucose, Bld 150 (*) 70 - 99 mg/dL   BUN 19  6 - 23 mg/dL   Creatinine, Ser 9.56  0.50 - 1.35 mg/dL   Calcium 7.4 (*) 8.4 - 10.5 mg/dL   GFR calc non Af Amer >90  >90 mL/min   GFR calc Af Amer >90  >90 mL/min   Anion gap 11  5 - 15  MAGNESIUM      Result Value Ref Range   Magnesium 2.0  1.5 - 2.5 mg/dL  PHOSPHORUS      Result Value Ref Range   Phosphorus 2.7  2.3 - 4.6 mg/dL  CBC      Result Value Ref Range   WBC 17.2 (*) 4.0 - 10.5 K/uL   RBC 3.86 (*) 4.22 - 5.81 MIL/uL   Hemoglobin 10.6 (*) 13.0 - 17.0 g/dL   HCT 21.3 (*) 08.6 - 57.8 %   MCV  84.2  78.0 - 100.0 fL   MCH 27.5  26.0 - 34.0 pg   MCHC 32.6  30.0 - 36.0 g/dL  RDW 14.4  11.5 - 15.5 %   Platelets 306  150 - 400 K/uL  GLUCOSE, CAPILLARY      Result Value Ref Range   Glucose-Capillary 147 (*) 70 - 99 mg/dL  GLUCOSE, CAPILLARY      Result Value Ref Range   Glucose-Capillary 160 (*) 70 - 99 mg/dL   Comment 1 Documented in Chart     Comment 2 Notify RN    GLUCOSE, CAPILLARY      Result Value Ref Range   Glucose-Capillary 139 (*) 70 - 99 mg/dL   Comment 1 Documented in Chart     Comment 2 Notify RN    GLUCOSE, CAPILLARY      Result Value Ref Range   Glucose-Capillary 128 (*) 70 - 99 mg/dL  GLUCOSE, CAPILLARY      Result Value Ref Range   Glucose-Capillary 144 (*) 70 - 99 mg/dL  CBC      Result Value Ref Range   WBC 19.0 (*) 4.0 - 10.5 K/uL   RBC 4.09 (*) 4.22 - 5.81 MIL/uL   Hemoglobin 11.2 (*) 13.0 - 17.0 g/dL   HCT 54.0 (*) 98.1 - 19.1 %   MCV 79.7  78.0 - 100.0 fL   MCH 27.4  26.0 - 34.0 pg   MCHC 34.4  30.0 - 36.0 g/dL   RDW 47.8  29.5 - 62.1 %   Platelets 407 (*) 150 - 400 K/uL  COMPREHENSIVE METABOLIC PANEL      Result Value Ref Range   Sodium 137  137 - 147 mEq/L   Potassium 3.1 (*) 3.7 - 5.3 mEq/L   Chloride 100  96 - 112 mEq/L   CO2 23  19 - 32 mEq/L   Glucose, Bld 122 (*) 70 - 99 mg/dL   BUN 17  6 - 23 mg/dL   Creatinine, Ser 3.08  0.50 - 1.35 mg/dL   Calcium 7.6 (*) 8.4 - 10.5 mg/dL   Total Protein 5.9 (*) 6.0 - 8.3 g/dL   Albumin 1.6 (*) 3.5 - 5.2 g/dL   AST 54 (*) 0 - 37 U/L   ALT 41  0 - 53 U/L   Alkaline Phosphatase 98  39 - 117 U/L   Total Bilirubin 1.9 (*) 0.3 - 1.2 mg/dL   GFR calc non Af Amer >90  >90 mL/min   GFR calc Af Amer >90  >90 mL/min   Anion gap 14  5 - 15  MAGNESIUM      Result Value Ref Range   Magnesium 1.7  1.5 - 2.5 mg/dL  PHOSPHORUS      Result Value Ref Range   Phosphorus 2.3  2.3 - 4.6 mg/dL  TRIGLYCERIDES      Result Value Ref Range   Triglycerides 227 (*) <150 mg/dL  GLUCOSE, CAPILLARY      Result  Value Ref Range   Glucose-Capillary 121 (*) 70 - 99 mg/dL   Comment 1 Documented in Chart     Comment 2 Notify RN    GLUCOSE, CAPILLARY      Result Value Ref Range   Glucose-Capillary 141 (*) 70 - 99 mg/dL  VANCOMYCIN, TROUGH      Result Value Ref Range   Vancomycin Tr 7.9 (*) 10.0 - 20.0 ug/mL  GLUCOSE, CAPILLARY      Result Value Ref Range   Glucose-Capillary 144 (*) 70 - 99 mg/dL  CBC      Result Value Ref Range   WBC 24.1 (*) 4.0 -  10.5 K/uL   RBC 4.41  4.22 - 5.81 MIL/uL   Hemoglobin 12.0 (*) 13.0 - 17.0 g/dL   HCT 40.9 (*) 81.1 - 91.4 %   MCV 79.8  78.0 - 100.0 fL   MCH 27.2  26.0 - 34.0 pg   MCHC 34.1  30.0 - 36.0 g/dL   RDW 78.2  95.6 - 21.3 %   Platelets 487 (*) 150 - 400 K/uL  BASIC METABOLIC PANEL      Result Value Ref Range   Sodium 136 (*) 137 - 147 mEq/L   Potassium 3.6 (*) 3.7 - 5.3 mEq/L   Chloride 102  96 - 112 mEq/L   CO2 20  19 - 32 mEq/L   Glucose, Bld 121 (*) 70 - 99 mg/dL   BUN 16  6 - 23 mg/dL   Creatinine, Ser 0.86  0.50 - 1.35 mg/dL   Calcium 7.3 (*) 8.4 - 10.5 mg/dL   GFR calc non Af Amer >90  >90 mL/min   GFR calc Af Amer >90  >90 mL/min   Anion gap 14  5 - 15  MAGNESIUM      Result Value Ref Range   Magnesium 1.8  1.5 - 2.5 mg/dL  PHOSPHORUS      Result Value Ref Range   Phosphorus 2.8  2.3 - 4.6 mg/dL  GLUCOSE, CAPILLARY      Result Value Ref Range   Glucose-Capillary 126 (*) 70 - 99 mg/dL   Comment 1 Notify RN    VANCOMYCIN, TROUGH      Result Value Ref Range   Vancomycin Tr 21.5 (*) 10.0 - 20.0 ug/mL  LACTIC ACID, PLASMA      Result Value Ref Range   Lactic Acid, Venous 1.5  0.5 - 2.2 mmol/L  PROCALCITONIN      Result Value Ref Range   Procalcitonin 2.37    PROCALCITONIN      Result Value Ref Range   Procalcitonin 2.25    GLUCOSE, CAPILLARY      Result Value Ref Range   Glucose-Capillary 144 (*) 70 - 99 mg/dL  GLUCOSE, CAPILLARY      Result Value Ref Range   Glucose-Capillary 148 (*) 70 - 99 mg/dL  URINALYSIS, ROUTINE W  REFLEX MICROSCOPIC      Result Value Ref Range   Color, Urine AMBER (*) YELLOW   APPearance CLEAR  CLEAR   Specific Gravity, Urine 1.020  1.005 - 1.030   pH 6.5  5.0 - 8.0   Glucose, UA 100 (*) NEGATIVE mg/dL   Hgb urine dipstick NEGATIVE  NEGATIVE   Bilirubin Urine MODERATE (*) NEGATIVE   Ketones, ur NEGATIVE  NEGATIVE mg/dL   Protein, ur 30 (*) NEGATIVE mg/dL   Urobilinogen, UA 1.0  0.0 - 1.0 mg/dL   Nitrite NEGATIVE  NEGATIVE   Leukocytes, UA NEGATIVE  NEGATIVE  URINE MICROSCOPIC-ADD ON      Result Value Ref Range   Squamous Epithelial / LPF FEW (*) RARE   WBC, UA 0-2  <3 WBC/hpf   RBC / HPF 0-2  <3 RBC/hpf   Bacteria, UA FEW (*) RARE  GLUCOSE, CAPILLARY      Result Value Ref Range   Glucose-Capillary 142 (*) 70 - 99 mg/dL  PROCALCITONIN      Result Value Ref Range   Procalcitonin 1.89    CBC WITH DIFFERENTIAL      Result Value Ref Range   WBC 29.0 (*) 4.0 - 10.5 K/uL   RBC  4.37  4.22 - 5.81 MIL/uL   Hemoglobin 12.0 (*) 13.0 - 17.0 g/dL   HCT 16.1 (*) 09.6 - 04.5 %   MCV 83.3  78.0 - 100.0 fL   MCH 27.5  26.0 - 34.0 pg   MCHC 33.0  30.0 - 36.0 g/dL   RDW 40.9  81.1 - 91.4 %   Platelets 470 (*) 150 - 400 K/uL   Neutrophils Relative % 89 (*) 43 - 77 %   Lymphocytes Relative 7 (*) 12 - 46 %   Monocytes Relative 4  3 - 12 %   Eosinophils Relative 0  0 - 5 %   Basophils Relative 0  0 - 1 %   Neutro Abs 25.8 (*) 1.7 - 7.7 K/uL   Lymphs Abs 2.0  0.7 - 4.0 K/uL   Monocytes Absolute 1.2 (*) 0.1 - 1.0 K/uL   Eosinophils Absolute 0.0  0.0 - 0.7 K/uL   Basophils Absolute 0.0  0.0 - 0.1 K/uL   WBC Morphology TOXIC GRANULATION    BASIC METABOLIC PANEL      Result Value Ref Range   Sodium 134 (*) 137 - 147 mEq/L   Potassium 3.9  3.7 - 5.3 mEq/L   Chloride 101  96 - 112 mEq/L   CO2 22  19 - 32 mEq/L   Glucose, Bld 144 (*) 70 - 99 mg/dL   BUN 17  6 - 23 mg/dL   Creatinine, Ser 7.82  0.50 - 1.35 mg/dL   Calcium 7.3 (*) 8.4 - 10.5 mg/dL   GFR calc non Af Amer >90  >90 mL/min    GFR calc Af Amer >90  >90 mL/min   Anion gap 11  5 - 15  GLUCOSE, CAPILLARY      Result Value Ref Range   Glucose-Capillary 137 (*) 70 - 99 mg/dL  GLUCOSE, CAPILLARY      Result Value Ref Range   Glucose-Capillary 137 (*) 70 - 99 mg/dL   Comment 1 Documented in Chart     Comment 2 Notify RN    PROTIME-INR      Result Value Ref Range   Prothrombin Time 15.4 (*) 11.6 - 15.2 seconds   INR 1.22  0.00 - 1.49  POCT I-STAT 3, ART BLOOD GAS (G3+)      Result Value Ref Range   pH, Arterial 7.362  7.350 - 7.450   pCO2 arterial 49.1 (*) 35.0 - 45.0 mmHg   pO2, Arterial 208.0 (*) 80.0 - 100.0 mmHg   Bicarbonate 27.8 (*) 20.0 - 24.0 mEq/L   TCO2 29  0 - 100 mmol/L   O2 Saturation 100.0     Acid-Base Excess 2.0  0.0 - 2.0 mmol/L   Patient temperature 99.0 F     Sample type ARTERIAL     A/P: Pt  s/p appendectomy 01/05/14 for perforated gangrenous appendicitis; now with increasing WBC, tachycardia/tachypnea, fever dsepite antbx therapy. F/u CT reveals prominent fluid collections  in the pericholecystic/ right subhepatic region, left subdiaphragmatic region, and in the pelvis. Plan is for CT guided aspiration/possible drainage of pelvic fluid collection today. Details/risks of procedure d/w pt/family via translator with their understanding and consent.

## 2014-01-14 NOTE — Progress Notes (Signed)
Patient ID: Jesus Hunter, male   DOB: 08-17-1970, 43 y.o.   MRN: [id number] 9 Days Post-Op  Subjective: No translators present.  Patient gives me the "thumbs up" when asked about his belly.  Objective: Vital signs in last 24 hours: Temp:  [99.6 F (37.6 C)-102.5 F (39.2 C)] 99.6 F (37.6 C) (09/05 0751) Pulse Rate:  [109-146] 138 (09/05 1000) Resp:  [27-40] 35 (09/05 1000) BP: (141-195)/(78-110) 167/97 mmHg (09/05 1000) SpO2:  [89 %-95 %] 92 % (09/05 1000) Weight:  [171 lb 11.8 oz (77.9 kg)] 171 lb 11.8 oz (77.9 kg) (09/05 0434) Last BM Date: 01/12/14  Intake/Output from previous day: 09/04 0701 - 09/05 0700 In: 3872 [I.V.:240; IV Piggyback:1400; TPN:2232] Out: 3500 [Urine:2055; Emesis/NG output:1250; Drains:195] Intake/Output this shift: Total I/O In: 309 [I.V.:30; TPN:279] Out: -   PE: Gen: still tachypneic Lungs:  Bilateral rales Heart: tachycardic Abd: soft, doesn't seem very tender, JP drain with serous output. Minimal bowel sounds, some distention, incisions c/d/i.  NGT with bilious output  Lab Results:   Recent Labs  01/13/14 0415 01/14/14 0433  WBC 24.1* 29.0*  HGB 12.0* 12.0*  HCT 35.2* 36.4*  PLT 487* 470*   BMET  Recent Labs  01/13/14 0415 01/14/14 0433  NA 136* 134*  K 3.6* 3.9  CL 102 101  CO2 20 22  GLUCOSE 121* 144*  BUN 16 17  CREATININE 0.77 0.78  CALCIUM 7.3* 7.3*   PT/INR No results found for this basename: LABPROT, INR,  in the last 72 hours CMP     Component Value Date/Time   NA 134* 01/14/2014 0433   K 3.9 01/14/2014 0433   CL 101 01/14/2014 0433   CO2 22 01/14/2014 0433   GLUCOSE 144* 01/14/2014 0433   BUN 17 01/14/2014 0433   CREATININE 0.78 01/14/2014 0433   CALCIUM 7.3* 01/14/2014 0433   PROT 5.9* 01/12/2014 0400   ALBUMIN 1.6* 01/12/2014 0400   AST 54* 01/12/2014 0400   ALT 41 01/12/2014 0400   ALKPHOS 98 01/12/2014 0400   BILITOT 1.9* 01/12/2014 0400   GFRNONAA >90 01/14/2014 0433   GFRAA >90 01/14/2014 0433   Lipase  No results found for  this basename: lipase       Studies/Results: Ct Chest W Contrast  01/13/2014   CLINICAL DATA:  Ruptured appendix.  Sepsis.  EXAM: CT CHEST WITH CONTRAST  CT ABDOMEN AND PELVIS WITH CONTRAST  TECHNIQUE: Multidetector CT imaging of the chest was performed during intravenous contrast administration. Multidetector CT imaging of the abdomen and pelvis was performed following the standard protocol before and during bolus administration of intravenous contrast.  CONTRAST:  80mL OMNIPAQUE IOHEXOL 300 MG/ML  SOLN  COMPARISON:  Chest x-ray 01/13/2014 and 01/10/2014. CT abdomen pelvis 01/09/2014.  FINDINGS: CT CHEST FINDINGS  Thoracic aorta normal in caliber. No aneurysm. Heart size normal. Left IJ catheter noted with its tip in the lower superior vena cava. NG tube noted with tip in the stomach.  Shotty mediastinal lymph nodes.  Esophagus is nondistended.  Large airways are patent. Severe dense diffuse pulmonary infiltrates are present with this basilar atelectasis and moderate bilateral pleural effusions. The prominent dense patchy bilateral pulmonary infiltrates are most consistent with pneumonia. Process such as pulmonary edema/ARDS cannot be excluded.  Thyroid is unremarkable. No significant axillary adenopathy. No acute bony abnormality identified.  CT ABDOMEN AND PELVIS FINDINGS  No focal hepatic abnormality. Spleen is unremarkable. Pancreas is unremarkable. No biliary distention. Gallbladder is nondistended. A pericholecystic/subhepatic fluid collection is present.  Maximum diameter is 3 cm. This could represent ascites. A phlegmon and/or abscess cannot be excluded. Left subdiaphragmatic fluid collections are noted. These measure up to 3.5 cm. This could represent loculated ascites. Seven Left medic phlegmon and/or abscess abscess on the left cannot be excluded. No air noted in these fluid collections. 5.3 cm fluid collection in the pelvis. This could represent ascites, however pelvic abscess cannot be excluded.  No air noted within this fluid collection.  Adrenals are unremarkable. Symmetric bilateral renal perfusion. No focal renal abnormality. No evidence of hydronephrosis. The bladder is nondistended. Prostate is not enlarged.  No significant inguinal or retroperitoneal adenopathy. Abdominal aorta normal in caliber.  Right lower quadrant drain catheter is noted. No evidence of right lower quadrant abscess. Inflammatory changes in the right lower quadrant noted consistent with prior appendicitis. Prominent thickening of small and large bowel walls. These changes may be reactive. Enterocolitis could present in this fashion. Ischemic bowel cannot be excluded. Stomach is nondistended. No evidence of free air. No abdominal wall significant hernia.  No acute bony abnormality identified.  IMPRESSION: 1. Severe diffuse dense bilateral pulmonary infiltrates most consistent severe bilateral pneumonia. Dense bibasilar atelectasis and moderate bilateral pleural effusions are present. 2. Prominent fluid collections are noted in the pericholecystic/ right subhepatic region, left subdiaphragmatic region, and in the pelvis. These could represent loculated regions of ascites however phlegmon/ developing abscess abscess in these regions cannot be excluded. 3. Severe thickened small large bowel wall. Although these changes may be reactive, enterocolitis or ischemic bowel cannot be excluded. 4. Right lower quadrant drainage catheter is again noted. No prominent fluid collections in this region. Adjacent inflammatory changes are present from patient's prior appendicitis.   Electronically Signed   By: Maisie Fus  Register   On: 01/13/2014 13:11   Ct Abdomen Pelvis W Contrast  01/13/2014   CLINICAL DATA:  Ruptured appendix.  Sepsis.  EXAM: CT CHEST WITH CONTRAST  CT ABDOMEN AND PELVIS WITH CONTRAST  TECHNIQUE: Multidetector CT imaging of the chest was performed during intravenous contrast administration. Multidetector CT imaging of the abdomen  and pelvis was performed following the standard protocol before and during bolus administration of intravenous contrast.  CONTRAST:  80mL OMNIPAQUE IOHEXOL 300 MG/ML  SOLN  COMPARISON:  Chest x-ray 01/13/2014 and 01/10/2014. CT abdomen pelvis 01/09/2014.  FINDINGS: CT CHEST FINDINGS  Thoracic aorta normal in caliber. No aneurysm. Heart size normal. Left IJ catheter noted with its tip in the lower superior vena cava. NG tube noted with tip in the stomach.  Shotty mediastinal lymph nodes.  Esophagus is nondistended.  Large airways are patent. Severe dense diffuse pulmonary infiltrates are present with this basilar atelectasis and moderate bilateral pleural effusions. The prominent dense patchy bilateral pulmonary infiltrates are most consistent with pneumonia. Process such as pulmonary edema/ARDS cannot be excluded.  Thyroid is unremarkable. No significant axillary adenopathy. No acute bony abnormality identified.  CT ABDOMEN AND PELVIS FINDINGS  No focal hepatic abnormality. Spleen is unremarkable. Pancreas is unremarkable. No biliary distention. Gallbladder is nondistended. A pericholecystic/subhepatic fluid collection is present. Maximum diameter is 3 cm. This could represent ascites. A phlegmon and/or abscess cannot be excluded. Left subdiaphragmatic fluid collections are noted. These measure up to 3.5 cm. This could represent loculated ascites. Seven Left medic phlegmon and/or abscess abscess on the left cannot be excluded. No air noted in these fluid collections. 5.3 cm fluid collection in the pelvis. This could represent ascites, however pelvic abscess cannot be excluded. No air noted within this fluid  collection.  Adrenals are unremarkable. Symmetric bilateral renal perfusion. No focal renal abnormality. No evidence of hydronephrosis. The bladder is nondistended. Prostate is not enlarged.  No significant inguinal or retroperitoneal adenopathy. Abdominal aorta normal in caliber.  Right lower quadrant drain  catheter is noted. No evidence of right lower quadrant abscess. Inflammatory changes in the right lower quadrant noted consistent with prior appendicitis. Prominent thickening of small and large bowel walls. These changes may be reactive. Enterocolitis could present in this fashion. Ischemic bowel cannot be excluded. Stomach is nondistended. No evidence of free air. No abdominal wall significant hernia.  No acute bony abnormality identified.  IMPRESSION: 1. Severe diffuse dense bilateral pulmonary infiltrates most consistent severe bilateral pneumonia. Dense bibasilar atelectasis and moderate bilateral pleural effusions are present. 2. Prominent fluid collections are noted in the pericholecystic/ right subhepatic region, left subdiaphragmatic region, and in the pelvis. These could represent loculated regions of ascites however phlegmon/ developing abscess abscess in these regions cannot be excluded. 3. Severe thickened small large bowel wall. Although these changes may be reactive, enterocolitis or ischemic bowel cannot be excluded. 4. Right lower quadrant drainage catheter is again noted. No prominent fluid collections in this region. Adjacent inflammatory changes are present from patient's prior appendicitis.   Electronically Signed   By: Maisie Fus  Register   On: 01/13/2014 13:11   Dg Chest Port 1 View  01/14/2014   CLINICAL DATA:  Follow-up infiltrates  EXAM: PORTABLE CHEST - 1 VIEW  COMPARISON:  CT chest dated 01/13/2014  FINDINGS: Multifocal patchy opacities, right lung predominant, suspicious for multifocal pneumonia. Small bilateral pleural effusions. No pneumothorax.  Mild cardiomegaly.  Left IJ venous catheter terminating at the cavoatrial junction. Enteric tube terminating in the proximal duodenum.  IMPRESSION: Multifocal patchy opacities, right lung predominant, suspicious for pneumonia.  Small bilateral pleural effusions.   Electronically Signed   By: Charline Bills M.D.   On: 01/14/2014 07:12   Dg  Chest Port 1 View  01/13/2014   CLINICAL DATA:  Fever, short of breath ?pneumonia  EXAM: PORTABLE CHEST - 1 VIEW  COMPARISON:  01/10/2014  FINDINGS: Increased patchy airspace opacity along the periphery right lung. Decreased retrocardiac opacity with some residual Hypoaeration. Interstitial and vascular crowding. Elevated right hemidiaphragm. Right pleural effusion not excluded. Left IJ central venous catheter tip projects over the cavoatrial junction. NG tube descends over the distal stomach.  IMPRESSION: Hypoaeration and bibasilar opacities. Increased peripheral right lung opacity, may reflect atelectasis and/or pneumonia.   Electronically Signed   By: Jearld Lesch M.D.   On: 01/13/2014 04:47   Dg Abd Portable 1v  01/13/2014   CLINICAL DATA:  Abdominal pain, recent appendectomy.  EXAM: PORTABLE ABDOMEN - 1 VIEW  COMPARISON:  01/10/2014  FINDINGS: NG tube tip projects over the distal stomach. There is contrast within colon. Nonspecific small bowel loop dilatation in the left upper quadrant. Surgical drain right lower quadrant and surgical clips in keeping with recent history of appendectomy.  IMPRESSION: NG tube tip projects over the distal stomach.  Postsurgical changes of recent appendectomy with drain in place.  Dilated loop of small bowel loops in the left upper abdomen is nonspecific and may reflect ileus. There is air and previously ingested contrast within normal caliber colon.   Electronically Signed   By: Jearld Lesch M.D.   On: 01/13/2014 04:45    Anti-infectives: Anti-infectives   Start     Dose/Rate Route Frequency Ordered Stop   01/14/14 1000  linezolid (ZYVOX) IVPB 600 mg  600 mg 300 mL/hr over 60 Minutes Intravenous Every 12 hours 01/14/14 0748     01/12/14 1400  vancomycin (VANCOCIN) 1,250 mg in sodium chloride 0.9 % 250 mL IVPB  Status:  Discontinued     1,250 mg 166.7 mL/hr over 90 Minutes Intravenous Every 6 hours 01/12/14 1305 01/14/14 0747   01/11/14 1400  meropenem  (MERREM) 1 g in sodium chloride 0.9 % 100 mL IVPB     1 g 200 mL/hr over 30 Minutes Intravenous 3 times per day 01/11/14 1138     01/11/14 1300  vancomycin (VANCOCIN) IVPB 1000 mg/200 mL premix  Status:  Discontinued     1,000 mg 200 mL/hr over 60 Minutes Intravenous Every 8 hours 01/11/14 1233 01/12/14 1305   01/10/14 1500  micafungin (MYCAMINE) 100 mg in sodium chloride 0.9 % 100 mL IVPB     100 mg 100 mL/hr over 1 Hours Intravenous Daily 01/10/14 1405     01/08/14 2200  vancomycin (VANCOCIN) IVPB 1000 mg/200 mL premix  Status:  Discontinued     1,000 mg 200 mL/hr over 60 Minutes Intravenous Every 12 hours 01/08/14 0913 01/10/14 1417   01/08/14 0945  vancomycin (VANCOCIN) 2,000 mg in sodium chloride 0.9 % 500 mL IVPB     2,000 mg 250 mL/hr over 120 Minutes Intravenous  Once 01/08/14 0913 01/08/14 1206   01/05/14 2200  piperacillin-tazobactam (ZOSYN) IVPB 3.375 g  Status:  Discontinued     3.375 g 12.5 mL/hr over 240 Minutes Intravenous 3 times per day 01/05/14 1847 01/11/14 1138   01/05/14 1345  piperacillin-tazobactam (ZOSYN) IVPB 3.375 g     3.375 g 100 mL/hr over 30 Minutes Intravenous  Once 01/05/14 1343 01/05/14 1445       Assessment/Plan  Gangrenous Ruptured Appendicitis---Dr. Lindie Spruce 01/05/14  PCM  POD#9  -continue with IV antibiotics  ileus - resolving, NGT to LWIS, will consider clamping trial based on CT results and if output decreases.  -SCD/lovenox  -continue foley for I&O  -drain care(serous)  -cultures-multiple organisms present  -zosyn 8/27-9/2. Vanc 8/30---> dc today Micafungin 9/1---> Meropenem 9/2---> Zyvox --> -CT of abdomen/pelvis shows a couple of loculated areas of ascites, ? Phlegmon along with persistent dense bibasilar opacities on his chest c/w PNA.  I have spoken to IR.  We will have them attempt to aspirate the pelvic collection and leave a drain if needed to see if this will help. -TPN  -ID consult given broad coverage of abx therapy and minimal  reponse VDRF  -appreciate CCM management  -Vanc for PNA  AKI-resolved, monitor renal function as this is his 2nd CT in 4 days.  HTN-hydralazine scheduled, PRN labetalol  Dispo-continue ICU    LOS: 9 days    Torrie Lafavor E 01/14/2014, 10:22 AM Pager: [phone]

## 2014-01-15 ENCOUNTER — Inpatient Hospital Stay (HOSPITAL_COMMUNITY): Payer: BC Managed Care – PPO

## 2014-01-15 DIAGNOSIS — R509 Fever, unspecified: Secondary | ICD-10-CM

## 2014-01-15 DIAGNOSIS — K560 Paralytic ileus: Secondary | ICD-10-CM

## 2014-01-15 DIAGNOSIS — R188 Other ascites: Secondary | ICD-10-CM

## 2014-01-15 DIAGNOSIS — D72829 Elevated white blood cell count, unspecified: Secondary | ICD-10-CM

## 2014-01-15 DIAGNOSIS — K659 Peritonitis, unspecified: Secondary | ICD-10-CM

## 2014-01-15 LAB — BASIC METABOLIC PANEL
ANION GAP: 11 (ref 5–15)
BUN: 19 mg/dL (ref 6–23)
CO2: 23 meq/L (ref 19–32)
Calcium: 7.6 mg/dL — ABNORMAL LOW (ref 8.4–10.5)
Chloride: 101 mEq/L (ref 96–112)
Creatinine, Ser: 0.77 mg/dL (ref 0.50–1.35)
GFR calc Af Amer: >90 mL/min (ref >90)
GFR calc non Af Amer: >90 mL/min (ref >90)
GLUCOSE: 144 mg/dL — AB (ref 70–99)
Potassium: 4.3 mEq/L (ref 3.7–5.3)
SODIUM: 135 meq/L — AB (ref 137–147)

## 2014-01-15 LAB — CBC WITH DIFFERENTIAL/PLATELET
Basophils Absolute: 0 10*3/uL (ref 0.0–0.1)
Basophils Relative: 0 % (ref 0–1)
Eosinophils Absolute: 0 10*3/uL (ref 0.0–0.7)
Eosinophils Relative: 0 % (ref 0–5)
HCT: 34.3 % — ABNORMAL LOW (ref 39.0–52.0)
HEMOGLOBIN: 11.2 g/dL — AB (ref 13.0–17.0)
LYMPHS PCT: 8 % — AB (ref 12–46)
Lymphs Abs: 2.2 10*3/uL (ref 0.7–4.0)
MCH: 27.5 pg (ref 26.0–34.0)
MCHC: 32.7 g/dL (ref 30.0–36.0)
MCV: 84.3 fL (ref 78.0–100.0)
MONOS PCT: 4 % (ref 3–12)
Monocytes Absolute: 1.1 10*3/uL — ABNORMAL HIGH (ref 0.1–1.0)
Neutro Abs: 24.3 10*3/uL — ABNORMAL HIGH (ref 1.7–7.7)
Neutrophils Relative %: 88 % — ABNORMAL HIGH (ref 43–77)
PLATELETS: 473 10*3/uL — AB (ref 150–400)
RBC: 4.07 MIL/uL — ABNORMAL LOW (ref 4.22–5.81)
RDW: 14.1 % (ref 11.5–15.5)
WBC: 27.6 10*3/uL — AB (ref 4.0–10.5)

## 2014-01-15 LAB — HEPATIC FUNCTION PANEL
ALK PHOS: 100 U/L (ref 39–117)
ALT: 45 U/L (ref 0–53)
AST: 56 U/L — ABNORMAL HIGH (ref 0–37)
Albumin: 1.5 g/dL — ABNORMAL LOW (ref 3.5–5.2)
BILIRUBIN INDIRECT: 0.8 mg/dL (ref 0.3–0.9)
BILIRUBIN TOTAL: 2.2 mg/dL — AB (ref 0.3–1.2)
Bilirubin, Direct: 1.4 mg/dL — ABNORMAL HIGH (ref 0.0–0.3)
Total Protein: 6.2 g/dL (ref 6.0–8.3)

## 2014-01-15 LAB — GLUCOSE, CAPILLARY
Glucose-Capillary: 157 mg/dL — ABNORMAL HIGH (ref 70–99)
Glucose-Capillary: 158 mg/dL — ABNORMAL HIGH (ref 70–99)
Glucose-Capillary: 144 mg/dL — ABNORMAL HIGH (ref 70–99)
Glucose-Capillary: 150 mg/dL — ABNORMAL HIGH (ref 70–99)

## 2014-01-15 LAB — HIV ANTIBODY (ROUTINE TESTING W REFLEX): HIV: NONREACTIVE

## 2014-01-15 MED ORDER — FAT EMULSION 20 % IV EMUL
250.0000 mL | INTRAVENOUS | Status: AC
  Administered 2014-01-15: 250 mL via INTRAVENOUS
  Filled 2014-01-15: qty 250

## 2014-01-15 MED ORDER — TRACE MINERALS CR-CU-F-FE-I-MN-MO-SE-ZN IV SOLN
INTRAVENOUS | Status: AC
  Administered 2014-01-15: 17:00:00 via INTRAVENOUS
  Filled 2014-01-15: qty 2000

## 2014-01-15 NOTE — Progress Notes (Signed)
PARENTERAL NUTRITION CONSULT NOTE - FOLLOW UP  Pharmacy Consult:  TPN Indication: prolonged ileus  No Known Allergies  Patient Measurements: Height:  (157.5 cm) Weight: 171 lb 8.3 oz (77.8 kg) IBW/kg (Calculated) : 54.6 Adjusted Body Weight: 61.2 kg  Vital Signs: Temp: 98.5 F (36.9 C) (09/06 0600) Temp src: Oral (09/06 0410) BP: 135/89 mmHg (09/06 0630) Pulse Rate: 117 (09/06 0630) Intake/Output from previous day: 09/05 0701 - 09/06 0700 In: 2956.6 [I.V.:230; IV Piggyback:600; TPN:2126.6] Out: 3257 [Urine:2025; Emesis/NG output:1140; Drains:92]  Labs:  Recent Labs  01/13/14 0415 01/14/14 0433 01/14/14 1019 01/15/14 0449  WBC 24.1* 29.0*  --  27.6*  HGB 12.0* 12.0*  --  11.2*  HCT 35.2* 36.4*  --  34.3*  PLT 487* 470*  --  473*  INR  --   --  1.22  --      Recent Labs  01/13/14 0415 01/14/14 0433 01/15/14 0449  NA 136* 134* 135*  K 3.6* 3.9 4.3  CL 102 101 101  CO2 GLUCOSE 121* 144* 144*  BUN CREATININE 0.77 0.78 0.77  CALCIUM 7.3* 7.3* 7.6*  MG 1.8  --   --   PHOS 2.8  --   --    Estimated Creatinine Clearance: 106.5 ml/min (by C-G formula based on Cr of 0.77).    Recent Labs  01/14/14 1755 01/14/14 2343 01/15/14 0600  GLUCAP 145* 157* 158*     Insulin Requirements in the past 24 hours:  7 units sensitive SSI  Assessment: 42 YOM s/p laparoscopic appendectomy for gangrenous ruptured appendicitis.  Pharmacy managing TPN for prolonged ileus.  Admit: RLQ abd pain GI: POD#10 appendectomy, perforated viscous >> ileus, NGT to LWIS >> O/P 1250 ml, prealbumin low at 8.1  LBM 9/3   9/5 CT guided drainage of peritoneal abscess Endo: no hx DM, CBGs acceptable Lytes: Na 135, K 4.3, CoCa 9.6 Renal: SCr 0.77(stable) -  UOP 1.1 ml/kg/hr Pulm: extubated, 15 L/min on non-rebreather, may require reintubation Cards: HLD / HTN - BP ok, tachycardic  on scheduled hydralazine   Hepatobil: mildly elevated AST, TG elevated at 227, tbili  mildly elevated - ok to continue supplementing trace elements Neuro: Fentanyl patch  ID: Current Abx  Meropenem, zyvox and mycamine for appendicitis + peritonitis + abscess s/p drainage 8/27 and 9/5, Tmax 99.6  WBC elevated   CT chest shows bibasilar PNA, s/p drainage of pelvic fluid and placement of drain Best Practices: heparin SQ, MC, PPI IV TPN Access: triple lumen placed 01/08/14 TPN day#: 6 (8/31 >> )  Current Nutrition:  TPN  Nutritional Goals:  2000-2100 kCal and 100-115gm protein daily   Plan:  - Cont Clinimix E 5/20 at 83 ml/hr + IVFE at 10 ml/hr.  TPN will provide 2233 kCal and 100gm protein daily, meeting 100% of kCal and 100% of minimal protein needs. - Daily multivitamin and trace elements, watch tbili - Consider d/c'ing SSI/CBG checks if CBGs remain controlled at stable TPN rate - Watch TG and decrease lipid provision as appropriate - F/U AM labs    Talbert Cage PharmD, Pager:  [phone] 01/15/2014, 7:27 AM

## 2014-01-15 NOTE — Progress Notes (Signed)
10 Days Post-Op  Subjective: Lang barrier; no Nurse, learning disability. Had drain placed in pelvic fluid collection yesterday. Febrile this am  Objective: Vital signs in last 24 hours: Temp:  [98.5 F (36.9 C)-102.1 F (38.9 C)] 99.6 F (37.6 C) (09/06 0731) Pulse Rate:  [104-143] 117 (09/06 0630) Resp:  [25-40] 25 (09/06 0630) BP: (135-183)/(70-140) 135/89 mmHg (09/06 0630) SpO2:  [91 %-100 %] 99 % (09/06 0630) FiO2 (%):  [80 %] 80 % (09/06 0630) Weight:  [171 lb 8.3 oz (77.8 kg)] 171 lb 8.3 oz (77.8 kg) (09/06 0447) Last BM Date: 01/12/14  Intake/Output from previous day: 09/05 0701 - 09/06 0700 In: 2956.6 [I.V.:230; IV Piggyback:600; TPN:2126.6] Out: 3257 [Urine:2025; Emesis/NG output:1140; Drains:92] Intake/Output this shift:    Alert, NAD, has NRB on Coarse bs b/l Tachy Soft, mild distension, +BS; drains- serous +SCD  Lab Results:   Recent Labs  01/14/14 0433 01/15/14 0449  WBC 29.0* 27.6*  HGB 12.0* 11.2*  HCT 36.4* 34.3*  PLT 470* 473*   BMET  Recent Labs  01/14/14 0433 01/15/14 0449  NA 134* 135*  K 3.9 4.3  CL 101 101  CO2 22 23  GLUCOSE 144* 144*  BUN 17 19  CREATININE 0.78 0.77  CALCIUM 7.3* 7.6*   PT/INR  Recent Labs  01/14/14 1019  LABPROT 15.4*  INR 1.22   ABG No results found for this basename: PHART, PCO2, PO2, HCO3,  in the last 72 hours  Studies/Results: Ct Chest W Contrast  01/13/2014   CLINICAL DATA:  Ruptured appendix.  Sepsis.  EXAM: CT CHEST WITH CONTRAST  CT ABDOMEN AND PELVIS WITH CONTRAST  TECHNIQUE: Multidetector CT imaging of the chest was performed during intravenous contrast administration. Multidetector CT imaging of the abdomen and pelvis was performed following the standard protocol before and during bolus administration of intravenous contrast.  CONTRAST:  80mL OMNIPAQUE IOHEXOL 300 MG/ML  SOLN  COMPARISON:  Chest x-ray 01/13/2014 and 01/10/2014. CT abdomen pelvis 01/09/2014.  FINDINGS: CT CHEST FINDINGS  Thoracic aorta normal  in caliber. No aneurysm. Heart size normal. Left IJ catheter noted with its tip in the lower superior vena cava. NG tube noted with tip in the stomach.  Shotty mediastinal lymph nodes.  Esophagus is nondistended.  Large airways are patent. Severe dense diffuse pulmonary infiltrates are present with this basilar atelectasis and moderate bilateral pleural effusions. The prominent dense patchy bilateral pulmonary infiltrates are most consistent with pneumonia. Process such as pulmonary edema/ARDS cannot be excluded.  Thyroid is unremarkable. No significant axillary adenopathy. No acute bony abnormality identified.  CT ABDOMEN AND PELVIS FINDINGS  No focal hepatic abnormality. Spleen is unremarkable. Pancreas is unremarkable. No biliary distention. Gallbladder is nondistended. A pericholecystic/subhepatic fluid collection is present. Maximum diameter is 3 cm. This could represent ascites. A phlegmon and/or abscess cannot be excluded. Left subdiaphragmatic fluid collections are noted. These measure up to 3.5 cm. This could represent loculated ascites. Seven Left medic phlegmon and/or abscess abscess on the left cannot be excluded. No air noted in these fluid collections. 5.3 cm fluid collection in the pelvis. This could represent ascites, however pelvic abscess cannot be excluded. No air noted within this fluid collection.  Adrenals are unremarkable. Symmetric bilateral renal perfusion. No focal renal abnormality. No evidence of hydronephrosis. The bladder is nondistended. Prostate is not enlarged.  No significant inguinal or retroperitoneal adenopathy. Abdominal aorta normal in caliber.  Right lower quadrant drain catheter is noted. No evidence of right lower quadrant abscess. Inflammatory changes in the right  lower quadrant noted consistent with prior appendicitis. Prominent thickening of small and large bowel walls. These changes may be reactive. Enterocolitis could present in this fashion. Ischemic bowel cannot be  excluded. Stomach is nondistended. No evidence of free air. No abdominal wall significant hernia.  No acute bony abnormality identified.  IMPRESSION: 1. Severe diffuse dense bilateral pulmonary infiltrates most consistent severe bilateral pneumonia. Dense bibasilar atelectasis and moderate bilateral pleural effusions are present. 2. Prominent fluid collections are noted in the pericholecystic/ right subhepatic region, left subdiaphragmatic region, and in the pelvis. These could represent loculated regions of ascites however phlegmon/ developing abscess abscess in these regions cannot be excluded. 3. Severe thickened small large bowel wall. Although these changes may be reactive, enterocolitis or ischemic bowel cannot be excluded. 4. Right lower quadrant drainage catheter is again noted. No prominent fluid collections in this region. Adjacent inflammatory changes are present from patient's prior appendicitis.   Electronically Signed   By: Maisie Fus  Register   On: 01/13/2014 13:11   Ct Abdomen Pelvis W Contrast  01/13/2014   CLINICAL DATA:  Ruptured appendix.  Sepsis.  EXAM: CT CHEST WITH CONTRAST  CT ABDOMEN AND PELVIS WITH CONTRAST  TECHNIQUE: Multidetector CT imaging of the chest was performed during intravenous contrast administration. Multidetector CT imaging of the abdomen and pelvis was performed following the standard protocol before and during bolus administration of intravenous contrast.  CONTRAST:  80mL OMNIPAQUE IOHEXOL 300 MG/ML  SOLN  COMPARISON:  Chest x-ray 01/13/2014 and 01/10/2014. CT abdomen pelvis 01/09/2014.  FINDINGS: CT CHEST FINDINGS  Thoracic aorta normal in caliber. No aneurysm. Heart size normal. Left IJ catheter noted with its tip in the lower superior vena cava. NG tube noted with tip in the stomach.  Shotty mediastinal lymph nodes.  Esophagus is nondistended.  Large airways are patent. Severe dense diffuse pulmonary infiltrates are present with this basilar atelectasis and moderate  bilateral pleural effusions. The prominent dense patchy bilateral pulmonary infiltrates are most consistent with pneumonia. Process such as pulmonary edema/ARDS cannot be excluded.  Thyroid is unremarkable. No significant axillary adenopathy. No acute bony abnormality identified.  CT ABDOMEN AND PELVIS FINDINGS  No focal hepatic abnormality. Spleen is unremarkable. Pancreas is unremarkable. No biliary distention. Gallbladder is nondistended. A pericholecystic/subhepatic fluid collection is present. Maximum diameter is 3 cm. This could represent ascites. A phlegmon and/or abscess cannot be excluded. Left subdiaphragmatic fluid collections are noted. These measure up to 3.5 cm. This could represent loculated ascites. Seven Left medic phlegmon and/or abscess abscess on the left cannot be excluded. No air noted in these fluid collections. 5.3 cm fluid collection in the pelvis. This could represent ascites, however pelvic abscess cannot be excluded. No air noted within this fluid collection.  Adrenals are unremarkable. Symmetric bilateral renal perfusion. No focal renal abnormality. No evidence of hydronephrosis. The bladder is nondistended. Prostate is not enlarged.  No significant inguinal or retroperitoneal adenopathy. Abdominal aorta normal in caliber.  Right lower quadrant drain catheter is noted. No evidence of right lower quadrant abscess. Inflammatory changes in the right lower quadrant noted consistent with prior appendicitis. Prominent thickening of small and large bowel walls. These changes may be reactive. Enterocolitis could present in this fashion. Ischemic bowel cannot be excluded. Stomach is nondistended. No evidence of free air. No abdominal wall significant hernia.  No acute bony abnormality identified.  IMPRESSION: 1. Severe diffuse dense bilateral pulmonary infiltrates most consistent severe bilateral pneumonia. Dense bibasilar atelectasis and moderate bilateral pleural effusions are present. 2.  Prominent fluid collections are noted in the pericholecystic/ right subhepatic region, left subdiaphragmatic region, and in the pelvis. These could represent loculated regions of ascites however phlegmon/ developing abscess abscess in these regions cannot be excluded. 3. Severe thickened small large bowel wall. Although these changes may be reactive, enterocolitis or ischemic bowel cannot be excluded. 4. Right lower quadrant drainage catheter is again noted. No prominent fluid collections in this region. Adjacent inflammatory changes are present from patient's prior appendicitis.   Electronically Signed   By: Maisie Fus  Register   On: 01/13/2014 13:11   Dg Chest Port 1 View  01/15/2014   CLINICAL DATA:  Evaluate for pulmonary infiltrates  EXAM: PORTABLE CHEST - 1 VIEW  COMPARISON:  01/14/2014; 01/13/2014; chest CT -01/13/2014  FINDINGS: Grossly unchanged borderline enlarged cardiac silhouette and mediastinal contours. Stable positioning of support apparatus. There is persistent mild elevation of the right hemidiaphragm. Pulmonary vasculature remains indistinct. Extensive bilateral heterogeneous airspace opacities are unchanged. Interval development of a small / trace right-sided pleural effusion. No new focal airspace opacities. Unchanged bones. Enteric contrast is seen within these splenic flexure of the colon.  IMPRESSION: 1.  Stable positioning of support apparatus.  No pneumothorax. 2. Grossly unchanged findings of pulmonary edema and extensive bilateral heterogeneous airspace opacities worrisome for multifocal infection. 3. Interval increase in small right-sided pleural effusion   Electronically Signed   By: Simonne Come M.D.   On: 01/15/2014 07:24   Dg Chest Port 1 View  01/14/2014   CLINICAL DATA:  Follow-up infiltrates  EXAM: PORTABLE CHEST - 1 VIEW  COMPARISON:  CT chest dated 01/13/2014  FINDINGS: Multifocal patchy opacities, right lung predominant, suspicious for multifocal pneumonia. Small bilateral  pleural effusions. No pneumothorax.  Mild cardiomegaly.  Left IJ venous catheter terminating at the cavoatrial junction. Enteric tube terminating in the proximal duodenum.  IMPRESSION: Multifocal patchy opacities, right lung predominant, suspicious for pneumonia.  Small bilateral pleural effusions.   Electronically Signed   By: Charline Bills M.D.   On: 01/14/2014 07:12   Ct Image Guided Drainage By Percutaneous Catheter  01/14/2014   CLINICAL DATA:  Status post appendectomy for ruptured appendicitis. Rising white blood cell count despite antibiotics with CT showing focal fluid collection in the deep pelvis. The patient presents for aspiration with possible catheter drainage of the pelvic fluid collection.  EXAM: CT GUIDED PERCUTANEOUS CATHETER DRAINAGE OF PELVIC PERITONEAL ABSCESS  ANESTHESIA/SEDATION: 0.5 Mg IV Versed 25 mcg IV Fentanyl  Total Moderate Sedation Time:  15 minutes  PROCEDURE: The procedure, risks, benefits, and alternatives were explained to the patient. Questions regarding the procedure were encouraged and answered. The patient understands and consents to the procedure. An interpreter was used.  The right gluteal region was prepped with Betadine in a sterile fashion, and a sterile drape was applied covering the operative field. A sterile gown and sterile gloves were used for the procedure. Local anesthesia was provided with 1% Lidocaine.  The patient was placed in a prone position. CT was performed through the pelvis. Under CT fluoroscopic guidance, an 18 gauge trocar needle was advanced from a right trans gluteal approach into the pelvis. After confirming needle tip position by CT, aspiration of fluid was performed and a sample sent for culture analysis. 15 mL of fluid was drawn off for culture analysis.  A guidewire was advanced through the needle into the collection. The needle was removed. The tract was dilated and a 12 French percutaneous drain placed. Drainage catheter position was  confirmed by  CT. The catheter was flushed with saline and connected to a suction bulb. It was secured at the skin with a Prolene retention suture and adhesive StatLock device.  COMPLICATIONS: None  FINDINGS: Needle aspiration at the level of the deep pelvic fluid collection anterior to the rectum yielded yellow colored turbid fluid. Based on appearance of the fluid, decision was made to place a percutaneous drainage catheter. After drain placement there is continued return of turbid fluid with suction bulb placement.  IMPRESSION: CT-guided drainage of pelvic abscess. Aspiration yielded turbid fluid. A sample was sent for culture analysis. A 12 French drain was placed via a right trans gluteal approach.   Electronically Signed   By: Irish Lack M.D.   On: 01/14/2014 14:09    Anti-infectives: Anti-infectives   Start     Dose/Rate Route Frequency Ordered Stop   01/14/14 1000  linezolid (ZYVOX) IVPB 600 mg     600 mg 300 mL/hr over 60 Minutes Intravenous Every 12 hours 01/14/14 0748     01/12/14 1400  vancomycin (VANCOCIN) 1,250 mg in sodium chloride 0.9 % 250 mL IVPB  Status:  Discontinued     1,250 mg 166.7 mL/hr over 90 Minutes Intravenous Every 6 hours 01/12/14 1305 01/14/14 0747   01/11/14 1400  meropenem (MERREM) 1 g in sodium chloride 0.9 % 100 mL IVPB     1 g 200 mL/hr over 30 Minutes Intravenous 3 times per day 01/11/14 1138     01/11/14 1300  vancomycin (VANCOCIN) IVPB 1000 mg/200 mL premix  Status:  Discontinued     1,000 mg 200 mL/hr over 60 Minutes Intravenous Every 8 hours 01/11/14 1233 01/12/14 1305   01/10/14 1500  micafungin (MYCAMINE) 100 mg in sodium chloride 0.9 % 100 mL IVPB     100 mg 100 mL/hr over 1 Hours Intravenous Daily 01/10/14 1405     01/08/14 2200  vancomycin (VANCOCIN) IVPB 1000 mg/200 mL premix  Status:  Discontinued     1,000 mg 200 mL/hr over 60 Minutes Intravenous Every 12 hours 01/08/14 0913 01/10/14 1417   01/08/14 0945  vancomycin (VANCOCIN) 2,000 mg in  sodium chloride 0.9 % 500 mL IVPB     2,000 mg 250 mL/hr over 120 Minutes Intravenous  Once 01/08/14 0913 01/08/14 1206   01/05/14 2200  piperacillin-tazobactam (ZOSYN) IVPB 3.375 g  Status:  Discontinued     3.375 g 12.5 mL/hr over 240 Minutes Intravenous 3 times per day 01/05/14 1847 01/11/14 1138   01/05/14 1345  piperacillin-tazobactam (ZOSYN) IVPB 3.375 g     3.375 g 100 mL/hr over 30 Minutes Intravenous  Once 01/05/14 1343 01/05/14 1445      Assessment/Plan: s/p Procedure(s): APPENDECTOMY LAPAROSCOPIC (N/A) Gangrenous Ruptured Appendicitis---Dr. Lindie Spruce 01/05/14  PCM  POD#10 -continue with IV antibiotics  ileus - >1L/24hrs, NGT to LWIS,  -SCD/lovenox  -continue foley for I&O  -drain care(serous)  -cultures-multiple organisms present  -zosyn 8/27-9/2. Vanc 8/30---> dc today Micafungin 9/1---> Meropenem 9/2---> Zyvox -->  -CT of abdomen/pelvis shows a couple of loculated areas of ascites, s/p drain placement in pelvic fluid collection - looks like simple serous fluid - Cx pending. Given quality of output - not sure if this is what is driving wbc.  -TPN  -ID consult given broad coverage of abx therapy and minimal reponse  VDRF  -appreciate CCM management  -Vanc for PNA  AKI-resolved, monitor renal function as this is his 2nd CT in 4 days.  HTN-hydralazine scheduled, PRN labetalol  Dispo-continue ICU  Cont TPN, cont NG to LIWS, f/u cultures  Mary Sella. Andrey Campanile, MD, FACS General, Bariatric, & Minimally Invasive Surgery [facility], [place]   LOS: 10 days    Atilano Ina 01/15/2014

## 2014-01-15 NOTE — Consult Note (Signed)
[facility]  Date of Admission:  01/05/2014  Date of Consult:  01/15/2014  Reason for Consult: Fever Referring Physician: Zubelevitsy  Impression/Recommendation Fever Leukocytosis Ileus HCAP Abd fluid collections Peritonitis Ruptured, gangrenous appendix  Would Await Cx from IR drain Consider change IJ Consider check lower extremity dopplers Check Hep B/C, HIV F/u lfts  Comment Suspect fever is from abd process, fluid collections. However, he also has pneumonia, icterus.   Thank you so much for this interesting consult,   Bobby Rumpf (pager) [phone] [email or web address]  Jesus Hunter is an 43 y.o. male.  HPI: 43 yo M from [place] admitted on 8-27 with acute, gangrenous, ruptured appendicitis. He was taken to OR on day of admission and had appendectomy and drain placed. He was started on zosyn. His course was complicated by transient increase in Cr, post op fever, ileus. By 8-30 he was felt to have SIRS and required re-intubation. His anbx were changed to vanco/zosyn. A f/u CT done at this time showed bibasilar pna and ileus. By 9-1, vancomycin was stopped and micafungin was added. He was able to be extubated on 9-2 and his zosyn was changed to merrem due to persistent fever. At this time opiod withdrawal was also questioned.  By 9-5 he had persistent fever and leukocytosis. He had repeat CT showing: 1. Severe diffuse dense bilateral pulmonary infiltrates most consistent severe bilateral pneumonia. Dense bibasilar atelectasis and moderate bilateral pleural effusions are present. 2. Prominent fluid collections are noted in the pericholecystic/ right subhepatic region, left subdiaphragmatic region, and in the pelvis. These could represent loculated regions of ascites however phlegmon/ developing abscess abscess in these regions cannot be excluded. 3. Severe thickened small large bowel wall. Although these changes may be reactive, enterocolitis or  ischemic bowel cannot be excluded. 4. Right lower quadrant drainage catheter is again noted. No prominent fluid collections in this region. Adjacent inflammatory changes are present from patient's prior appendicitis. He had IR drain placed in peritoneal abscess 9-5 (turbid fluid).   Per nurse- no BM. L IJ is from 01-08-14.   Past Medical History  Diagnosis Date  . Hypertension   . High cholesterol     Past Surgical History  Procedure Laterality Date  . Appendectomy  01/05/2014    ruptured/notes 01/05/2014  . Laparoscopic appendectomy N/A 01/05/2014    Procedure: APPENDECTOMY LAPAROSCOPIC;  Surgeon: Zenovia Jarred, MD;  Location: [facility] OR;  Service: General;  Laterality: N/A;     No Known Allergies  Medications:  Scheduled: . antiseptic oral rinse  7 mL Mouth Rinse QID  . chlorhexidine  15 mL Mouth Rinse BID  . fentaNYL  75 mcg Transdermal Q72H  . heparin subcutaneous  5,000 Units Subcutaneous 3 times per day  . hydrALAZINE  10 mg Intravenous Q6H  . insulin aspart  0-9 Units Subcutaneous 4 times per day  . linezolid  600 mg Intravenous Q12H  . meropenem (MERREM) IV  1 g Intravenous 3 times per day  . micafungin [facility]) IV  100 mg Intravenous Daily    Abtx:  Anti-infectives   Start     Dose/Rate Route Frequency Ordered Stop   01/14/14 1000  linezolid (ZYVOX) IVPB 600 mg     600 mg 300 mL/hr over 60 Minutes Intravenous Every 12 hours 01/14/14 0748     01/12/14 1400  vancomycin (VANCOCIN) 1,250 mg in sodium chloride 0.9 % 250 mL IVPB  Status:  Discontinued     1,250 mg 166.7 mL/hr over 90  Minutes Intravenous Every 6 hours 01/12/14 1305 01/14/14 0747   01/11/14 1400  meropenem (MERREM) 1 g in sodium chloride 0.9 % 100 mL IVPB     1 g 200 mL/hr over 30 Minutes Intravenous 3 times per day 01/11/14 1138     01/11/14 1300  vancomycin (VANCOCIN) IVPB 1000 mg/200 mL premix  Status:  Discontinued     1,000 mg 200 mL/hr over 60 Minutes Intravenous Every 8 hours 01/11/14 1233 01/12/14  1305   01/10/14 1500  micafungin (MYCAMINE) 100 mg in sodium chloride 0.9 % 100 mL IVPB     100 mg 100 mL/hr over 1 Hours Intravenous Daily 01/10/14 1405     01/08/14 2200  vancomycin (VANCOCIN) IVPB 1000 mg/200 mL premix  Status:  Discontinued     1,000 mg 200 mL/hr over 60 Minutes Intravenous Every 12 hours 01/08/14 0913 01/10/14 1417   01/08/14 0945  vancomycin (VANCOCIN) 2,000 mg in sodium chloride 0.9 % 500 mL IVPB     2,000 mg 250 mL/hr over 120 Minutes Intravenous  Once 01/08/14 0913 01/08/14 1206   01/05/14 2200  piperacillin-tazobactam (ZOSYN) IVPB 3.375 g  Status:  Discontinued     3.375 g 12.5 mL/hr over 240 Minutes Intravenous 3 times per day 01/05/14 1847 01/11/14 1138   01/05/14 1345  piperacillin-tazobactam (ZOSYN) IVPB 3.375 g     3.375 g 100 mL/hr over 30 Minutes Intravenous  Once 01/05/14 1343 01/05/14 1445      Total days of antibiotics: 9-5 zyvox 9-2 micafungin 9-1 Merrem 8-27 zosyn 9-1 8-30 vanco 9-4         Social History:  reports that he has quit smoking. His smoking use included Cigarettes. He has a 12 pack-year smoking history. He has never used smokeless tobacco. He reports that he drinks alcohol. He reports that he does not use illicit drugs.  History reviewed. No pertinent family history.  General ROS: c/o abd pain. see HPI. no translator available.  Blood pressure 149/85, pulse 101, temperature 99.6 F (37.6 C), temperature source Oral, resp. rate 25, height _0  (1.575 m), weight 77.8 kg (171 lb 8.3 oz), SpO2 100.00%. General appearance: alert, cooperative and no distress Eyes: negative findings: icteric, EOMI.  Throat: mild d/c at angles of mouth.  Neck: no adenopathy, supple, symmetrical, trachea midline and L IJ is clean.  Lungs: rhonchi bilaterally Heart: regular rate and rhythm Abdomen: normal findings: bowel sounds normal and soft, non-tender and abnormal findings:  distended and wounds clean.  Extremities: edema trace   Results for  orders placed during the hospital encounter of 01/05/14 (from the past 48 hour(s))  GLUCOSE, CAPILLARY     Status: Abnormal   Collection Time    01/13/14  2:18 PM      Result Value Ref Range   Glucose-Capillary 142 (*) 70 - 99 mg/dL  GLUCOSE, CAPILLARY     Status: Abnormal   Collection Time    01/13/14  6:26 PM      Result Value Ref Range   Glucose-Capillary 137 (*) 70 - 99 mg/dL  GLUCOSE, CAPILLARY     Status: Abnormal   Collection Time    01/13/14 11:45 PM      Result Value Ref Range   Glucose-Capillary 137 (*) 70 - 99 mg/dL   Comment 1 Documented in Chart     Comment 2 Notify RN    PROCALCITONIN     Status: None   Collection Time    01/14/14  4:33 AM  Result Value Ref Range   Procalcitonin 1.89     Comment:            Interpretation:     PCT > 0.5 ng/mL and <= 2 ng/mL:     Systemic infection (sepsis) is possible,     but other conditions are known to elevate     PCT as well.     (NOTE)             ICU PCT Algorithm               Non ICU PCT Algorithm        ----------------------------     ------------------------------             PCT < 0.25 ng/mL                 PCT < 0.1 ng/mL         Stopping of antibiotics            Stopping of antibiotics           strongly encouraged.               strongly encouraged.        ----------------------------     ------------------------------           PCT level decrease by               PCT < 0.25 ng/mL           >= 80% from peak PCT           OR PCT 0.25 - 0.5 ng/mL          Stopping of antibiotics                                                 encouraged.         Stopping of antibiotics               encouraged.        ----------------------------     ------------------------------           PCT level decrease by              PCT >= 0.25 ng/mL           < 80% from peak PCT            AND PCT >= 0.5 ng/mL            Continuing antibiotics                                                  encouraged.           Continuing  antibiotics                encouraged.        ----------------------------     ------------------------------         PCT level increase compared          PCT > 0.5 ng/mL             with peak PCT AND  PCT >= 0.5 ng/mL             Escalation of antibiotics                                              strongly encouraged.          Escalation of antibiotics            strongly encouraged.  CBC WITH DIFFERENTIAL     Status: Abnormal   Collection Time    01/14/14  4:33 AM      Result Value Ref Range   WBC 29.0 (*) 4.0 - 10.5 K/uL   RBC 4.37  4.22 - 5.81 MIL/uL   Hemoglobin 12.0 (*) 13.0 - 17.0 g/dL   HCT 36.4 (*) 39.0 - 52.0 %   MCV 83.3  78.0 - 100.0 fL   MCH 27.5  26.0 - 34.0 pg   MCHC 33.0  30.0 - 36.0 g/dL   RDW 14.1  11.5 - 15.5 %   Platelets 470 (*) 150 - 400 K/uL   Neutrophils Relative % 89 (*) 43 - 77 %   Lymphocytes Relative 7 (*) 12 - 46 %   Monocytes Relative 4  3 - 12 %   Eosinophils Relative 0  0 - 5 %   Basophils Relative 0  0 - 1 %   Neutro Abs 25.8 (*) 1.7 - 7.7 K/uL   Lymphs Abs 2.0  0.7 - 4.0 K/uL   Monocytes Absolute 1.2 (*) 0.1 - 1.0 K/uL   Eosinophils Absolute 0.0  0.0 - 0.7 K/uL   Basophils Absolute 0.0  0.0 - 0.1 K/uL   WBC Morphology TOXIC GRANULATION     Comment: ATYPICAL LYMPHOCYTES  BASIC METABOLIC PANEL     Status: Abnormal   Collection Time    01/14/14  4:33 AM      Result Value Ref Range   Sodium 134 (*) 137 - 147 mEq/L   Potassium 3.9  3.7 - 5.3 mEq/L   Chloride 101  96 - 112 mEq/L   CO2 22  19 - 32 mEq/L   Glucose, Bld 144 (*) 70 - 99 mg/dL   BUN 17  6 - 23 mg/dL   Creatinine, Ser 0.78  0.50 - 1.35 mg/dL   Calcium 7.3 (*) 8.4 - 10.5 mg/dL   GFR calc non Af Amer >90  >90 mL/min   GFR calc Af Amer >90  >90 mL/min   Comment: (NOTE)     The eGFR has been calculated using the CKD EPI equation.     This calculation has not been validated in all clinical situations.     eGFR's persistently <90 mL/min signify possible Chronic Kidney       Disease.   Anion gap 11  5 - 15  PROTIME-INR     Status: Abnormal   Collection Time    01/14/14 10:19 AM      Result Value Ref Range   Prothrombin Time 15.4 (*) 11.6 - 15.2 seconds   INR 1.22  0.00 - 1.49  GLUCOSE, CAPILLARY     Status: Abnormal   Collection Time    01/14/14 11:01 AM      Result Value Ref Range   Glucose-Capillary 174 (*) 70 - 99 mg/dL  CULTURE, ROUTINE-ABSCESS     Status: None   Collection Time    01/14/14  1:24 PM      Result Value Ref Range   Specimen Description PERITONEAL CAVITY     Special Requests Normal     Gram Stain PENDING     Culture       Value: NO GROWTH 1 DAY     Performed at [facility]   Report Status PENDING    ANAEROBIC CULTURE     Status: None   Collection Time    01/14/14  1:24 PM      Result Value Ref Range   Specimen Description PERITONEAL CAVITY     Special Requests Normal     Gram Stain PENDING     Culture       Value: NO ANAEROBES ISOLATED; CULTURE IN PROGRESS FOR 5 DAYS     Performed at [facility]   Report Status PENDING    GLUCOSE, CAPILLARY     Status: Abnormal   Collection Time    01/14/14  5:55 PM      Result Value Ref Range   Glucose-Capillary 145 (*) 70 - 99 mg/dL  GLUCOSE, CAPILLARY     Status: Abnormal   Collection Time    01/14/14 11:43 PM      Result Value Ref Range   Glucose-Capillary 157 (*) 70 - 99 mg/dL   Comment 1 Notify RN    CBC WITH DIFFERENTIAL     Status: Abnormal   Collection Time    01/15/14  4:49 AM      Result Value Ref Range   WBC 27.6 (*) 4.0 - 10.5 K/uL   RBC 4.07 (*) 4.22 - 5.81 MIL/uL   Hemoglobin 11.2 (*) 13.0 - 17.0 g/dL   HCT 34.3 (*) 39.0 - 52.0 %   MCV 84.3  78.0 - 100.0 fL   MCH 27.5  26.0 - 34.0 pg   MCHC 32.7  30.0 - 36.0 g/dL   RDW 14.1  11.5 - 15.5 %   Platelets 473 (*) 150 - 400 K/uL   Neutrophils Relative % 88 (*) 43 - 77 %   Lymphocytes Relative 8 (*) 12 - 46 %   Monocytes Relative 4  3 - 12 %   Eosinophils Relative 0  0 - 5 %   Basophils  Relative 0  0 - 1 %   Neutro Abs 24.3 (*) 1.7 - 7.7 K/uL   Lymphs Abs 2.2  0.7 - 4.0 K/uL   Monocytes Absolute 1.1 (*) 0.1 - 1.0 K/uL   Eosinophils Absolute 0.0  0.0 - 0.7 K/uL   Basophils Absolute 0.0  0.0 - 0.1 K/uL   Smear Review MORPHOLOGY UNREMARKABLE    BASIC METABOLIC PANEL     Status: Abnormal   Collection Time    01/15/14  4:49 AM      Result Value Ref Range   Sodium 135 (*) 137 - 147 mEq/L   Potassium 4.3  3.7 - 5.3 mEq/L   Chloride 101  96 - 112 mEq/L   CO2 23  19 - 32 mEq/L   Glucose, Bld 144 (*) 70 - 99 mg/dL   BUN 19  6 - 23 mg/dL   Creatinine, Ser 0.77  0.50 - 1.35 mg/dL   Calcium 7.6 (*) 8.4 - 10.5 mg/dL   GFR calc non Af Amer >90  >90 mL/min   GFR calc Af Amer >90  >90 mL/min   Comment: (NOTE)     The eGFR has been calculated using the CKD EPI equation.  This calculation has not been validated in all clinical situations.     eGFR's persistently <90 mL/min signify possible Chronic Kidney     Disease.   Anion gap 11  5 - 15  GLUCOSE, CAPILLARY     Status: Abnormal   Collection Time    01/15/14  6:00 AM      Result Value Ref Range   Glucose-Capillary 158 (*) 70 - 99 mg/dL   Comment 1 Notify RN        Component Value Date/Time   SDES PERITONEAL CAVITY 01/14/2014 [facility] 01/14/2014 1324   SPECREQUEST Normal 01/14/2014 1324   SPECREQUEST Normal 01/14/2014 1324   CULT  Value: NO GROWTH 1 DAY Performed at [facility] 01/14/2014 1324   CULT  Value: NO ANAEROBES ISOLATED; CULTURE IN PROGRESS FOR 5 DAYS Performed at [facility] 01/14/2014 1324   REPTSTATUS PENDING 01/14/2014 1324   REPTSTATUS PENDING 01/14/2014 1324   Dg Chest Port 1 View  01/15/2014   CLINICAL DATA:  Evaluate for pulmonary infiltrates  EXAM: PORTABLE CHEST - 1 VIEW  COMPARISON:  01/14/2014; 01/13/2014; chest CT -01/13/2014  FINDINGS: Grossly unchanged borderline enlarged cardiac silhouette and mediastinal contours. Stable positioning of support apparatus. There is  persistent mild elevation of the right hemidiaphragm. Pulmonary vasculature remains indistinct. Extensive bilateral heterogeneous airspace opacities are unchanged. Interval development of a small / trace right-sided pleural effusion. No new focal airspace opacities. Unchanged bones. Enteric contrast is seen within these splenic flexure of the colon.  IMPRESSION: 1.  Stable positioning of support apparatus.  No pneumothorax. 2. Grossly unchanged findings of pulmonary edema and extensive bilateral heterogeneous airspace opacities worrisome for multifocal infection. 3. Interval increase in small right-sided pleural effusion   Electronically Signed   By: Sandi Mariscal M.D.   On: 01/15/2014 07:24   Dg Chest Port 1 View  01/14/2014   CLINICAL DATA:  Follow-up infiltrates  EXAM: PORTABLE CHEST - 1 VIEW  COMPARISON:  CT chest dated 01/13/2014  FINDINGS: Multifocal patchy opacities, right lung predominant, suspicious for multifocal pneumonia. Small bilateral pleural effusions. No pneumothorax.  Mild cardiomegaly.  Left IJ venous catheter terminating at the cavoatrial junction. Enteric tube terminating in the proximal duodenum.  IMPRESSION: Multifocal patchy opacities, right lung predominant, suspicious for pneumonia.  Small bilateral pleural effusions.   Electronically Signed   By: Julian Hy M.D.   On: 01/14/2014 07:12   Ct Image Guided Drainage By Percutaneous Catheter  01/14/2014   CLINICAL DATA:  Status post appendectomy for ruptured appendicitis. Rising white blood cell count despite antibiotics with CT showing focal fluid collection in the deep pelvis. The patient presents for aspiration with possible catheter drainage of the pelvic fluid collection.  EXAM: CT GUIDED PERCUTANEOUS CATHETER DRAINAGE OF PELVIC PERITONEAL ABSCESS  ANESTHESIA/SEDATION: 0.5 Mg IV Versed 25 mcg IV Fentanyl  Total Moderate Sedation Time:  15 minutes  PROCEDURE: The procedure, risks, benefits, and alternatives were explained to the  patient. Questions regarding the procedure were encouraged and answered. The patient understands and consents to the procedure. An interpreter was used.  The right gluteal region was prepped with Betadine in a sterile fashion, and a sterile drape was applied covering the operative field. A sterile gown and sterile gloves were used for the procedure. Local anesthesia was provided with 1% Lidocaine.  The patient was placed in a prone position. CT was performed through the pelvis. Under CT fluoroscopic guidance, an 18 gauge trocar needle was advanced from  a right trans gluteal approach into the pelvis. After confirming needle tip position by CT, aspiration of fluid was performed and a sample sent for culture analysis. 15 mL of fluid was drawn off for culture analysis.  A guidewire was advanced through the needle into the collection. The needle was removed. The tract was dilated and a 12 French percutaneous drain placed. Drainage catheter position was confirmed by CT. The catheter was flushed with saline and connected to a suction bulb. It was secured at the skin with a Prolene retention suture and adhesive StatLock device.  COMPLICATIONS: None  FINDINGS: Needle aspiration at the level of the deep pelvic fluid collection anterior to the rectum yielded yellow colored turbid fluid. Based on appearance of the fluid, decision was made to place a percutaneous drainage catheter. After drain placement there is continued return of turbid fluid with suction bulb placement.  IMPRESSION: CT-guided drainage of pelvic abscess. Aspiration yielded turbid fluid. A sample was sent for culture analysis. A 12 French drain was placed via a right trans gluteal approach.   Electronically Signed   By: Aletta Edouard M.D.   On: 01/14/2014 14:09   Recent Results (from the past 240 hour(s))  BODY FLUID CULTURE     Status: None   Collection Time    01/05/14  4:27 PM      Result Value Ref Range Status   Specimen Description PERITONEAL  FLUID   Final   Special Requests PATIENT ON FOLLOWING ZOSYN RECEIVED SWAB   Final   Gram Stain     Final   Value: NO WBC SEEN     ABUNDANT GRAM NEGATIVE RODS     ABUNDANT GRAM POSITIVE RODS     MODERATE GRAM POSITIVE COCCI     IN PAIRS Gram Stain Report Called to,Read Back By and Verified With: Gram Stain Report Called to,Read Back By and Verified With: Micki Riley RN 01/06/14 0740AM BY [place]   Culture     Final   Value: MULTIPLE ORGANISMS PRESENT, NONE PREDOMINANT     Performed at [facility]   Report Status 01/07/2014 FINAL   Final  ANAEROBIC CULTURE     Status: None   Collection Time    01/05/14  4:27 PM      Result Value Ref Range Status   Specimen Description PERITONEAL FLUID   Final   Special Requests PATIENT ON FOLLOWING ZOSYN RECEIVED SWAB   Final   Gram Stain     Final   Value: NO WBC SEEN     ABUNDANT GRAM NEGATIVE RODS     ABUNDANT GRAM POSITIVE RODS     MODERATE GRAM POSITIVE COCCI     IN PAIRS   Culture     Final   Value: NO ANAEROBES ISOLATED     Performed at [facility]   Report Status 01/10/2014 FINAL   Final  CULTURE, BLOOD (ROUTINE X 2)     Status: None   Collection Time    01/08/14 12:45 PM      Result Value Ref Range Status   Specimen Description BLOOD LEFT ARM   Final   Special Requests BOTTLES DRAWN AEROBIC ONLY 5CC   Final   Culture  Setup Time     Final   Value: 01/08/2014 18:43     Performed at [facility]   Culture     Final   Value: NO GROWTH 5 DAYS     Performed at [facility]  Report Status 01/14/2014 FINAL   Final  CULTURE, BLOOD (ROUTINE X 2)     Status: None   Collection Time    01/08/14  1:00 PM      Result Value Ref Range Status   Specimen Description BLOOD LEFT HAND   Final   Special Requests BOTTLES DRAWN AEROBIC ONLY 8CC   Final   Culture  Setup Time     Final   Value: 01/08/2014 18:44     Performed at [facility]   Culture     Final   Value: NO GROWTH 5 DAYS     Performed at [facility]   Report Status 01/14/2014 FINAL   Final  MRSA PCR SCREENING     Status: None   Collection Time    01/08/14  5:23 PM      Result Value Ref Range Status   MRSA by PCR NEGATIVE  NEGATIVE Final   Comment:            The GeneXpert MRSA Assay (FDA     approved for NASAL specimens     only), is one component of a     comprehensive MRSA colonization     surveillance program. It is not     intended to diagnose MRSA     infection nor to guide or     monitor treatment for     MRSA infections.  CULTURE, ROUTINE-ABSCESS     Status: None   Collection Time    01/14/14  1:24 PM      Result Value Ref Range Status   Specimen Description PERITONEAL CAVITY   Final   Special Requests Normal   Final   Gram Stain PENDING   Incomplete   Culture     Final   Value: NO GROWTH 1 DAY     Performed at [facility]   Report Status PENDING   Incomplete  ANAEROBIC CULTURE     Status: None   Collection Time    01/14/14  1:24 PM      Result Value Ref Range Status   Specimen Description PERITONEAL CAVITY   Final   Special Requests Normal   Final   Gram Stain PENDING   Incomplete   Culture     Final   Value: NO ANAEROBES ISOLATED; CULTURE IN PROGRESS FOR 5 DAYS     Performed at [facility]   Report Status PENDING   Incomplete      01/15/2014, 1:40 PM     LOS: 10 days

## 2014-01-15 NOTE — Progress Notes (Signed)
10 Days Post-Op  Subjective: Pt without new c/o per nursing; resting quietly  Objective: Vital signs in last 24 hours: Temp:  [98.5 F (36.9 C)-102.1 F (38.9 C)] 99.6 F (37.6 C) (09/06 0731) Pulse Rate:  [104-143] 117 (09/06 0630) Resp:  [25-40] 25 (09/06 0630) BP: (135-179)/(70-140) 141/86 mmHg (09/06 0843) SpO2:  [91 %-100 %] 99 % (09/06 0630) FiO2 (%):  [80 %] 80 % (09/06 0630) Weight:  [171 lb 8.3 oz (77.8 kg)] 171 lb 8.3 oz (77.8 kg) (09/06 0447) Last BM Date: 01/12/14  Intake/Output from previous day: 09/05 0701 - 09/06 0700 In: 2956.6 [I.V.:230; IV Piggyback:600; TPN:2126.6] Out: 3257 [Urine:2025; Emesis/NG output:1140; Drains:92] Intake/Output this shift:    TG/pelvic drain intact, dressing dry, site mildly tender, output 90 cc's turbid, beige fluid; cx's pend; drain irrigated without difficulty  Lab Results:   Recent Labs  01/14/14 0433 01/15/14 0449  WBC 29.0* 27.6*  HGB 12.0* 11.2*  HCT 36.4* 34.3*  PLT 470* 473*   BMET  Recent Labs  01/14/14 0433 01/15/14 0449  NA 134* 135*  K 3.9 4.3  CL 101 101  CO2 22 23  GLUCOSE 144* 144*  BUN 17 19  CREATININE 0.78 0.77  CALCIUM 7.3* 7.6*   PT/INR  Recent Labs  01/14/14 1019  LABPROT 15.4*  INR 1.22   ABG No results found for this basename: PHART, PCO2, PO2, HCO3,  in the last 72 hours  Studies/Results: Ct Chest W Contrast  01/13/2014   CLINICAL DATA:  Ruptured appendix.  Sepsis.  EXAM: CT CHEST WITH CONTRAST  CT ABDOMEN AND PELVIS WITH CONTRAST  TECHNIQUE: Multidetector CT imaging of the chest was performed during intravenous contrast administration. Multidetector CT imaging of the abdomen and pelvis was performed following the standard protocol before and during bolus administration of intravenous contrast.  CONTRAST:  80mL OMNIPAQUE IOHEXOL 300 MG/ML  SOLN  COMPARISON:  Chest x-ray 01/13/2014 and 01/10/2014. CT abdomen pelvis 01/09/2014.  FINDINGS: CT CHEST FINDINGS  Thoracic aorta normal in  caliber. No aneurysm. Heart size normal. Left IJ catheter noted with its tip in the lower superior vena cava. NG tube noted with tip in the stomach.  Shotty mediastinal lymph nodes.  Esophagus is nondistended.  Large airways are patent. Severe dense diffuse pulmonary infiltrates are present with this basilar atelectasis and moderate bilateral pleural effusions. The prominent dense patchy bilateral pulmonary infiltrates are most consistent with pneumonia. Process such as pulmonary edema/ARDS cannot be excluded.  Thyroid is unremarkable. No significant axillary adenopathy. No acute bony abnormality identified.  CT ABDOMEN AND PELVIS FINDINGS  No focal hepatic abnormality. Spleen is unremarkable. Pancreas is unremarkable. No biliary distention. Gallbladder is nondistended. A pericholecystic/subhepatic fluid collection is present. Maximum diameter is 3 cm. This could represent ascites. A phlegmon and/or abscess cannot be excluded. Left subdiaphragmatic fluid collections are noted. These measure up to 3.5 cm. This could represent loculated ascites. Seven Left medic phlegmon and/or abscess abscess on the left cannot be excluded. No air noted in these fluid collections. 5.3 cm fluid collection in the pelvis. This could represent ascites, however pelvic abscess cannot be excluded. No air noted within this fluid collection.  Adrenals are unremarkable. Symmetric bilateral renal perfusion. No focal renal abnormality. No evidence of hydronephrosis. The bladder is nondistended. Prostate is not enlarged.  No significant inguinal or retroperitoneal adenopathy. Abdominal aorta normal in caliber.  Right lower quadrant drain catheter is noted. No evidence of right lower quadrant abscess. Inflammatory changes in the right lower quadrant noted  consistent with prior appendicitis. Prominent thickening of small and large bowel walls. These changes may be reactive. Enterocolitis could present in this fashion. Ischemic bowel cannot be  excluded. Stomach is nondistended. No evidence of free air. No abdominal wall significant hernia.  No acute bony abnormality identified.  IMPRESSION: 1. Severe diffuse dense bilateral pulmonary infiltrates most consistent severe bilateral pneumonia. Dense bibasilar atelectasis and moderate bilateral pleural effusions are present. 2. Prominent fluid collections are noted in the pericholecystic/ right subhepatic region, left subdiaphragmatic region, and in the pelvis. These could represent loculated regions of ascites however phlegmon/ developing abscess abscess in these regions cannot be excluded. 3. Severe thickened small large bowel wall. Although these changes may be reactive, enterocolitis or ischemic bowel cannot be excluded. 4. Right lower quadrant drainage catheter is again noted. No prominent fluid collections in this region. Adjacent inflammatory changes are present from patient's prior appendicitis.   Electronically Signed   By: Maisie Fus  Register   On: 01/13/2014 13:11   Ct Abdomen Pelvis W Contrast  01/13/2014   CLINICAL DATA:  Ruptured appendix.  Sepsis.  EXAM: CT CHEST WITH CONTRAST  CT ABDOMEN AND PELVIS WITH CONTRAST  TECHNIQUE: Multidetector CT imaging of the chest was performed during intravenous contrast administration. Multidetector CT imaging of the abdomen and pelvis was performed following the standard protocol before and during bolus administration of intravenous contrast.  CONTRAST:  80mL OMNIPAQUE IOHEXOL 300 MG/ML  SOLN  COMPARISON:  Chest x-ray 01/13/2014 and 01/10/2014. CT abdomen pelvis 01/09/2014.  FINDINGS: CT CHEST FINDINGS  Thoracic aorta normal in caliber. No aneurysm. Heart size normal. Left IJ catheter noted with its tip in the lower superior vena cava. NG tube noted with tip in the stomach.  Shotty mediastinal lymph nodes.  Esophagus is nondistended.  Large airways are patent. Severe dense diffuse pulmonary infiltrates are present with this basilar atelectasis and moderate  bilateral pleural effusions. The prominent dense patchy bilateral pulmonary infiltrates are most consistent with pneumonia. Process such as pulmonary edema/ARDS cannot be excluded.  Thyroid is unremarkable. No significant axillary adenopathy. No acute bony abnormality identified.  CT ABDOMEN AND PELVIS FINDINGS  No focal hepatic abnormality. Spleen is unremarkable. Pancreas is unremarkable. No biliary distention. Gallbladder is nondistended. A pericholecystic/subhepatic fluid collection is present. Maximum diameter is 3 cm. This could represent ascites. A phlegmon and/or abscess cannot be excluded. Left subdiaphragmatic fluid collections are noted. These measure up to 3.5 cm. This could represent loculated ascites. Seven Left medic phlegmon and/or abscess abscess on the left cannot be excluded. No air noted in these fluid collections. 5.3 cm fluid collection in the pelvis. This could represent ascites, however pelvic abscess cannot be excluded. No air noted within this fluid collection.  Adrenals are unremarkable. Symmetric bilateral renal perfusion. No focal renal abnormality. No evidence of hydronephrosis. The bladder is nondistended. Prostate is not enlarged.  No significant inguinal or retroperitoneal adenopathy. Abdominal aorta normal in caliber.  Right lower quadrant drain catheter is noted. No evidence of right lower quadrant abscess. Inflammatory changes in the right lower quadrant noted consistent with prior appendicitis. Prominent thickening of small and large bowel walls. These changes may be reactive. Enterocolitis could present in this fashion. Ischemic bowel cannot be excluded. Stomach is nondistended. No evidence of free air. No abdominal wall significant hernia.  No acute bony abnormality identified.  IMPRESSION: 1. Severe diffuse dense bilateral pulmonary infiltrates most consistent severe bilateral pneumonia. Dense bibasilar atelectasis and moderate bilateral pleural effusions are present. 2.  Prominent fluid collections  are noted in the pericholecystic/ right subhepatic region, left subdiaphragmatic region, and in the pelvis. These could represent loculated regions of ascites however phlegmon/ developing abscess abscess in these regions cannot be excluded. 3. Severe thickened small large bowel wall. Although these changes may be reactive, enterocolitis or ischemic bowel cannot be excluded. 4. Right lower quadrant drainage catheter is again noted. No prominent fluid collections in this region. Adjacent inflammatory changes are present from patient's prior appendicitis.   Electronically Signed   By: Maisie Fus  Register   On: 01/13/2014 13:11   Dg Chest Port 1 View  01/15/2014   CLINICAL DATA:  Evaluate for pulmonary infiltrates  EXAM: PORTABLE CHEST - 1 VIEW  COMPARISON:  01/14/2014; 01/13/2014; chest CT -01/13/2014  FINDINGS: Grossly unchanged borderline enlarged cardiac silhouette and mediastinal contours. Stable positioning of support apparatus. There is persistent mild elevation of the right hemidiaphragm. Pulmonary vasculature remains indistinct. Extensive bilateral heterogeneous airspace opacities are unchanged. Interval development of a small / trace right-sided pleural effusion. No new focal airspace opacities. Unchanged bones. Enteric contrast is seen within these splenic flexure of the colon.  IMPRESSION: 1.  Stable positioning of support apparatus.  No pneumothorax. 2. Grossly unchanged findings of pulmonary edema and extensive bilateral heterogeneous airspace opacities worrisome for multifocal infection. 3. Interval increase in small right-sided pleural effusion   Electronically Signed   By: Simonne Come M.D.   On: 01/15/2014 07:24   Dg Chest Port 1 View  01/14/2014   CLINICAL DATA:  Follow-up infiltrates  EXAM: PORTABLE CHEST - 1 VIEW  COMPARISON:  CT chest dated 01/13/2014  FINDINGS: Multifocal patchy opacities, right lung predominant, suspicious for multifocal pneumonia. Small bilateral  pleural effusions. No pneumothorax.  Mild cardiomegaly.  Left IJ venous catheter terminating at the cavoatrial junction. Enteric tube terminating in the proximal duodenum.  IMPRESSION: Multifocal patchy opacities, right lung predominant, suspicious for pneumonia.  Small bilateral pleural effusions.   Electronically Signed   By: Charline Bills M.D.   On: 01/14/2014 07:12   Ct Image Guided Drainage By Percutaneous Catheter  01/14/2014   CLINICAL DATA:  Status post appendectomy for ruptured appendicitis. Rising white blood cell count despite antibiotics with CT showing focal fluid collection in the deep pelvis. The patient presents for aspiration with possible catheter drainage of the pelvic fluid collection.  EXAM: CT GUIDED PERCUTANEOUS CATHETER DRAINAGE OF PELVIC PERITONEAL ABSCESS  ANESTHESIA/SEDATION: 0.5 Mg IV Versed 25 mcg IV Fentanyl  Total Moderate Sedation Time:  15 minutes  PROCEDURE: The procedure, risks, benefits, and alternatives were explained to the patient. Questions regarding the procedure were encouraged and answered. The patient understands and consents to the procedure. An interpreter was used.  The right gluteal region was prepped with Betadine in a sterile fashion, and a sterile drape was applied covering the operative field. A sterile gown and sterile gloves were used for the procedure. Local anesthesia was provided with 1% Lidocaine.  The patient was placed in a prone position. CT was performed through the pelvis. Under CT fluoroscopic guidance, an 18 gauge trocar needle was advanced from a right trans gluteal approach into the pelvis. After confirming needle tip position by CT, aspiration of fluid was performed and a sample sent for culture analysis. 15 mL of fluid was drawn off for culture analysis.  A guidewire was advanced through the needle into the collection. The needle was removed. The tract was dilated and a 12 French percutaneous drain placed. Drainage catheter position was  confirmed by CT. The catheter  was flushed with saline and connected to a suction bulb. It was secured at the skin with a Prolene retention suture and adhesive StatLock device.  COMPLICATIONS: None  FINDINGS: Needle aspiration at the level of the deep pelvic fluid collection anterior to the rectum yielded yellow colored turbid fluid. Based on appearance of the fluid, decision was made to place a percutaneous drainage catheter. After drain placement there is continued return of turbid fluid with suction bulb placement.  IMPRESSION: CT-guided drainage of pelvic abscess. Aspiration yielded turbid fluid. A sample was sent for culture analysis. A 12 French drain was placed via a right trans gluteal approach.   Electronically Signed   By: Irish Lack M.D.   On: 01/14/2014 14:09    Anti-infectives: Anti-infectives   Start     Dose/Rate Route Frequency Ordered Stop   01/14/14 1000  linezolid (ZYVOX) IVPB 600 mg     600 mg 300 mL/hr over 60 Minutes Intravenous Every 12 hours 01/14/14 0748     01/12/14 1400  vancomycin (VANCOCIN) 1,250 mg in sodium chloride 0.9 % 250 mL IVPB  Status:  Discontinued     1,250 mg 166.7 mL/hr over 90 Minutes Intravenous Every 6 hours 01/12/14 1305 01/14/14 0747   01/11/14 1400  meropenem (MERREM) 1 g in sodium chloride 0.9 % 100 mL IVPB     1 g 200 mL/hr over 30 Minutes Intravenous 3 times per day 01/11/14 1138     01/11/14 1300  vancomycin (VANCOCIN) IVPB 1000 mg/200 mL premix  Status:  Discontinued     1,000 mg 200 mL/hr over 60 Minutes Intravenous Every 8 hours 01/11/14 1233 01/12/14 1305   01/10/14 1500  micafungin (MYCAMINE) 100 mg in sodium chloride 0.9 % 100 mL IVPB     100 mg 100 mL/hr over 1 Hours Intravenous Daily 01/10/14 1405     01/08/14 2200  vancomycin (VANCOCIN) IVPB 1000 mg/200 mL premix  Status:  Discontinued     1,000 mg 200 mL/hr over 60 Minutes Intravenous Every 12 hours 01/08/14 0913 01/10/14 1417   01/08/14 0945  vancomycin (VANCOCIN) 2,000 mg in  sodium chloride 0.9 % 500 mL IVPB     2,000 mg 250 mL/hr over 120 Minutes Intravenous  Once 01/08/14 0913 01/08/14 1206   01/05/14 2200  piperacillin-tazobactam (ZOSYN) IVPB 3.375 g  Status:  Discontinued     3.375 g 12.5 mL/hr over 240 Minutes Intravenous 3 times per day 01/05/14 1847 01/11/14 1138   01/05/14 1345  piperacillin-tazobactam (ZOSYN) IVPB 3.375 g     3.375 g 100 mL/hr over 30 Minutes Intravenous  Once 01/05/14 1343 01/05/14 1445      Assessment/Plan: S/p pelvic abscess drainage 9/5; monitor output/labs; check final cx's; check f/u CT within 1 week    LOS: 10 days    Alfie Alderfer,D [facility] 01/15/2014

## 2014-01-15 NOTE — Progress Notes (Signed)
PULMONARY / CRITICAL CARE MEDICINE  Name: Jesus Hunter MRN: [id number] DOB: 08-11-1970    ADMISSION DATE:  01/05/2014 CONSULTATION DATE:  01/08/2014  REFERRING MD :  Catha Gosselin  CHIEF COMPLAINT:  Respiratory distress  INITIAL PRESENTATION: 43 yo presented 8/27 with ruptured appendix, taken to surgery for laparoscopic appendectomy and drainage of intra-abd abscess. On 8/30 developed sepsis and respiratory distress requiring intubation.  STUDIES / EVENTS:  8/27  Abdomen CT >>>  Ruptured appendix  8/30  Sepsis / acute respiratory failure >>> intubated 8/31  Abdomen CT >>> Ileus, mildly distended gallbladder without stones, bibasal atelectasis  9/1    Opioids withdrawal suspected 9/2    Extubated  9/4    Chest / abdomen CT >>> bilateral dense pulmonary infiltrates, several abdominal fluid collections 9/5    Pelvic fluid collection drained by IR which yielded turbid fluid  INTERVAL HISTORY:  Remains febrile, with tenuous respiratory status  VITAL SIGNS: Temp:  [98.5 F (36.9 C)-102.1 F (38.9 C)] 98.5 F (36.9 C) (09/06 0600) Pulse Rate:  [104-143] 117 (09/06 0630) Resp:  [25-40] 25 (09/06 0630) BP: (135-183)/(70-140) 135/89 mmHg (09/06 0630) SpO2:  [91 %-100 %] 99 % (09/06 0630) FiO2 (%):  [80 %] 80 % (09/06 0630) Weight:  [77.8 kg (171 lb 8.3 oz)] 77.8 kg (171 lb 8.3 oz) (09/06 0447)  HEMODYNAMICS:   VENTILATOR SETTINGS: Vent Mode:  [-]  FiO2 (%):  [80 %] 80 %  INTAKE / OUTPUT: Intake/Output     09/05 0701 - 09/06 0700 09/06 0701 - 09/07 0700   I.V. (mL/kg) 230 (3)    IV Piggyback 600    TPN 2126.6    Total Intake(mL/kg) 2956.6 (38)    Urine (mL/kg/hr) 2025 (1.1)    Emesis/NG output 1140 (0.6)    Drains 92 (0)    Total Output 3257     Net -300.4           PHYSICAL EXAMINATION: General: Increased work of breathing Neuro:  Awake, alert HEENT:  NGT Cardiovascular:  Regular, tachycardic Lungs:  Bilateral air entry, rales, strong cough Abdomen:  Abdomen somewhat tender,  bowel sounds diminished Musculoskeletal:  Intact  Skin:  Intact   LABS:  CBC  Recent Labs Lab 01/13/14 0415 01/14/14 0433 01/15/14 0449  WBC 24.1* 29.0* 27.6*  HGB 12.0* 12.0* 11.2*  HCT 35.2* 36.4* 34.3*  PLT 487* 470* 473*   Coag's  Recent Labs Lab 01/14/14 1019  INR 1.22    BMET  Recent Labs Lab 01/13/14 0415 01/14/14 0433 01/15/14 0449  NA 136* 134* 135*  K 3.6* 3.9 4.3  CL 102 101 101  CO2 BUN CREATININE 0.77 0.78 0.77  GLUCOSE 121* 144* 144*   Electrolytes  Recent Labs Lab 01/11/14 0300 01/12/14 0400 01/13/14 0415 01/14/14 0433 01/15/14 0449  CALCIUM 7.4* 7.6* 7.3* 7.3* 7.6*  MG 2.0 1.7 1.8  --   --   PHOS 2.7 2.3 2.8  --   --    Sepsis Markers  Recent Labs Lab 01/08/14 1630 01/12/14 2331 01/12/14 2359 01/13/14 0415 01/14/14 0433  LATICACIDVEN 1.9  --  1.5  --   --   PROCALCITON  --  2.37  --  2.25 1.89   ABG  Recent Labs Lab 01/08/14 1608 01/08/14 1825 01/09/14 0350  PHART 7.436 7.362 7.377  PCO2ART 37.4 49.1* 49.8*  PO2ART 59.6* 208.0* 106.0*   Liver Enzymes  Recent Labs Lab 01/09/14 0505 01/10/14 0525 01/12/14 0400  AST 31 39* 54*  ALT 25 30 41  ALKPHOS 46 58 98  BILITOT 1.3* 1.9* 1.9*  ALBUMIN 1.8* 1.5* 1.6*   Cardiac Enzymes  Recent Labs Lab 01/08/14 0938  01/08/14 2240 01/09/14 0505 01/09/14 1000  TROPONINI <0.30  < > <0.30 <0.30 <0.30  PROBNP 334.9*  --   --   --   --   < > = values in this interval not displayed.  Glucose  Recent Labs Lab 01/13/14 1826 01/13/14 2345 01/14/14 1101 01/14/14 1755 01/14/14 2343 01/15/14 0600  GLUCAP 137* 137* 174* 145* 157* 158*   IMAGING:   Dg Chest Port 1 View  01/14/2014   CLINICAL DATA:  Follow-up infiltrates  EXAM: PORTABLE CHEST - 1 VIEW  COMPARISON:  CT chest dated 01/13/2014  FINDINGS: Multifocal patchy opacities, right lung predominant, suspicious for multifocal pneumonia. Small bilateral pleural effusions. No pneumothorax.  Mild  cardiomegaly.  Left IJ venous catheter terminating at the cavoatrial junction. Enteric tube terminating in the proximal duodenum.  IMPRESSION: Multifocal patchy opacities, right lung predominant, suspicious for pneumonia.  Small bilateral pleural effusions.   Electronically Signed   By: Charline Bills M.D.   On: 01/14/2014 07:12   Ct Image Guided Drainage By Percutaneous Catheter  01/14/2014   CLINICAL DATA:  Status post appendectomy for ruptured appendicitis. Rising white blood cell count despite antibiotics with CT showing focal fluid collection in the deep pelvis. The patient presents for aspiration with possible catheter drainage of the pelvic fluid collection.  EXAM: CT GUIDED PERCUTANEOUS CATHETER DRAINAGE OF PELVIC PERITONEAL ABSCESS  ANESTHESIA/SEDATION: 0.5 Mg IV Versed 25 mcg IV Fentanyl  Total Moderate Sedation Time:  15 minutes  PROCEDURE: The procedure, risks, benefits, and alternatives were explained to the patient. Questions regarding the procedure were encouraged and answered. The patient understands and consents to the procedure. An interpreter was used.  The right gluteal region was prepped with Betadine in a sterile fashion, and a sterile drape was applied covering the operative field. A sterile gown and sterile gloves were used for the procedure. Local anesthesia was provided with 1% Lidocaine.  The patient was placed in a prone position. CT was performed through the pelvis. Under CT fluoroscopic guidance, an 18 gauge trocar needle was advanced from a right trans gluteal approach into the pelvis. After confirming needle tip position by CT, aspiration of fluid was performed and a sample sent for culture analysis. 15 mL of fluid was drawn off for culture analysis.  A guidewire was advanced through the needle into the collection. The needle was removed. The tract was dilated and a 12 French percutaneous drain placed. Drainage catheter position was confirmed by CT. The catheter was flushed with  saline and connected to a suction bulb. It was secured at the skin with a Prolene retention suture and adhesive StatLock device.  COMPLICATIONS: None  FINDINGS: Needle aspiration at the level of the deep pelvic fluid collection anterior to the rectum yielded yellow colored turbid fluid. Based on appearance of the fluid, decision was made to place a percutaneous drainage catheter. After drain placement there is continued return of turbid fluid with suction bulb placement.  IMPRESSION: CT-guided drainage of pelvic abscess. Aspiration yielded turbid fluid. A sample was sent for culture analysis. A 12 French drain was placed via a right trans gluteal approach.   Electronically Signed   By: Irish Lack M.D.   On: 01/14/2014 14:09   ASSESSMENT / PLAN:  PULMONARY A:  Acute hypoxemic respiratory failure in  setting of perforated viscus, abdominal surgery and possible opioid withdrawal; extubated 9/2 Possible HCAP, but should have been responding to wide spectrum antibiotics Probable ARDS secondary to intraabdominal sepsis Bilateral dense airspace disease on CT 9/4 P:   Supplemental oxygen Goal SpO2>92 Incentive spirometry / flutter valve May require re-intubation  CARDIOVASCULAR A: HTN Sepsis P:  Goal BP < 160/90 Hydralazine scheduled Labetalol / Hydralazine PRN for hypertension Metoprolol PRN tachycardia  RENAL A:   Mild hyponatremia P:   Trend BMP  GASTROINTESTINAL A:   Ruptured appendicitis Ascites / abdominal fluid collections on CT 9/4, suspected abscesses, pelvic drained by IR on 9/5 Ileus GI Px is not required Nutrition P:   NPO NGT to Sx TPN  HEMATOLOGIC A:   VTE Px P:  Heparin Weld  INFECTIOUS A:   Appendicitis, peritonitis, abscess s/p appendectomy and drainage 8/27 Abscess culture 8/27 >>> polymicrobial Possible HCAP P:   Meropenem 9/2 >>> Micafungin 9/1 >>> Zyvox 9/5 >>> Follow abscess culture from 9/5 If abscess culture is positive should other  collections be drained vs re exploring?  ENDOCRINE A: At risk for hyperglycemia with TNA   P:   SSI  NEUROLOGIC A:   Possible opioid use / abuse prior to admission? Possible opioid withdrawal ( high tolerance, fever, agitation / diaphoresis promptly responding to IV opioids ) Post op pain Anxiety P:   Fentanyl patch Dilaudid PRN Versed PRN  I have personally obtained history, examined patient, evaluated and interpreted laboratory and imaging results, reviewed medical records, formulated assessment / plan and placed orders.  CRITICAL CARE:  The patient is critically ill with multiple organ systems failure and requires high complexity decision making for assessment and support, frequent evaluation and titration of therapies, application of advanced monitoring technologies and extensive interpretation of multiple databases. Critical Care Time devoted to patient care services described in this note is 35 minutes.   Lonia Farber, MD Pulmonary and Critical Care Medicine [facility] Pager: [phone]  01/15/2014, 7:05 AM

## 2014-01-16 ENCOUNTER — Inpatient Hospital Stay (HOSPITAL_COMMUNITY): Payer: BC Managed Care – PPO

## 2014-01-16 DIAGNOSIS — R609 Edema, unspecified: Secondary | ICD-10-CM

## 2014-01-16 DIAGNOSIS — R6520 Severe sepsis without septic shock: Secondary | ICD-10-CM

## 2014-01-16 DIAGNOSIS — A419 Sepsis, unspecified organism: Secondary | ICD-10-CM

## 2014-01-16 DIAGNOSIS — E43 Unspecified severe protein-calorie malnutrition: Secondary | ICD-10-CM

## 2014-01-16 DIAGNOSIS — J80 Acute respiratory distress syndrome: Secondary | ICD-10-CM

## 2014-01-16 LAB — CBC WITH DIFFERENTIAL/PLATELET
BASOS PCT: 0 % (ref 0–1)
Basophils Absolute: 0 10*3/uL (ref 0.0–0.1)
EOS ABS: 0 10*3/uL (ref 0.0–0.7)
EOS PCT: 0 % (ref 0–5)
HEMATOCRIT: 31.3 % — AB (ref 39.0–52.0)
Hemoglobin: 10.3 g/dL — ABNORMAL LOW (ref 13.0–17.0)
LYMPHS ABS: 1.4 10*3/uL (ref 0.7–4.0)
Lymphocytes Relative: 8 % — ABNORMAL LOW (ref 12–46)
MCH: 27.3 pg (ref 26.0–34.0)
MCHC: 32.9 g/dL (ref 30.0–36.0)
MCV: 83 fL (ref 78.0–100.0)
MONO ABS: 1.2 10*3/uL — AB (ref 0.1–1.0)
Monocytes Relative: 7 % (ref 3–12)
NEUTROS ABS: 15.2 10*3/uL — AB (ref 1.7–7.7)
Neutrophils Relative %: 85 % — ABNORMAL HIGH (ref 43–77)
Platelets: 487 10*3/uL — ABNORMAL HIGH (ref 150–400)
RBC: 3.77 MIL/uL — ABNORMAL LOW (ref 4.22–5.81)
RDW: 14.1 % (ref 11.5–15.5)
WBC: 17.8 10*3/uL — ABNORMAL HIGH (ref 4.0–10.5)

## 2014-01-16 LAB — COMPREHENSIVE METABOLIC PANEL
ALT: 65 U/L — ABNORMAL HIGH (ref 0–53)
AST: 81 U/L — ABNORMAL HIGH (ref 0–37)
Albumin: 1.5 g/dL — ABNORMAL LOW (ref 3.5–5.2)
Alkaline Phosphatase: 99 U/L (ref 39–117)
Anion gap: 10 (ref 5–15)
BILIRUBIN TOTAL: 2 mg/dL — AB (ref 0.3–1.2)
BUN: 19 mg/dL (ref 6–23)
CO2: 24 meq/L (ref 19–32)
CREATININE: 0.63 mg/dL (ref 0.50–1.35)
Calcium: 7.6 mg/dL — ABNORMAL LOW (ref 8.4–10.5)
Chloride: 101 mEq/L (ref 96–112)
GFR calc Af Amer: >90 mL/min (ref >90)
Glucose, Bld: 174 mg/dL — ABNORMAL HIGH (ref 70–99)
POTASSIUM: 4.5 meq/L (ref 3.7–5.3)
Sodium: 135 mEq/L — ABNORMAL LOW (ref 137–147)
Total Protein: 6.2 g/dL (ref 6.0–8.3)

## 2014-01-16 LAB — GLUCOSE, CAPILLARY
GLUCOSE-CAPILLARY: 147 mg/dL — AB (ref 70–99)
GLUCOSE-CAPILLARY: 140 mg/dL — AB (ref 70–99)
Glucose-Capillary: 138 mg/dL — ABNORMAL HIGH (ref 70–99)
Glucose-Capillary: 168 mg/dL — ABNORMAL HIGH (ref 70–99)
Glucose-Capillary: 93 mg/dL (ref 70–99)

## 2014-01-16 LAB — HEPATITIS PANEL, ACUTE
HCV Ab: NEGATIVE
HEP B C IGM: NONREACTIVE
HEP B S AG: NEGATIVE
Hep A IgM: NONREACTIVE

## 2014-01-16 LAB — TRIGLYCERIDES: Triglycerides: 174 mg/dL — ABNORMAL HIGH (ref <150)

## 2014-01-16 LAB — PHOSPHORUS: Phosphorus: 2.9 mg/dL (ref 2.3–4.6)

## 2014-01-16 LAB — PREALBUMIN: PREALBUMIN: 9.5 mg/dL — AB (ref 17.0–34.0)

## 2014-01-16 LAB — MAGNESIUM: MAGNESIUM: 2.1 mg/dL (ref 1.5–2.5)

## 2014-01-16 MED ORDER — INSULIN ASPART 100 UNIT/ML ~~LOC~~ SOLN
0.0000 [IU] | SUBCUTANEOUS | Status: DC
  Administered 2014-01-16 – 2014-01-20 (×10): 1 [IU] via SUBCUTANEOUS

## 2014-01-16 MED ORDER — INSULIN ASPART 100 UNIT/ML ~~LOC~~ SOLN: 0.0000 [IU] | SUBCUTANEOUS | Status: DC

## 2014-01-16 MED ORDER — FAT EMULSION 20 % IV EMUL
250.0000 mL | INTRAVENOUS | Status: DC
  Administered 2014-01-16: 250 mL via INTRAVENOUS
  Filled 2014-01-16: qty 250

## 2014-01-16 MED ORDER — M.V.I. ADULT IV INJ
INJECTION | INTRAVENOUS | Status: DC
  Administered 2014-01-16: 17:00:00 via INTRAVENOUS
  Filled 2014-01-16: qty 2000

## 2014-01-16 MED ORDER — INSULIN ASPART 100 UNIT/ML ~~LOC~~ SOLN
2.0000 [IU] | Freq: Once | SUBCUTANEOUS | Status: AC
  Administered 2014-01-16: 2 [IU] via SUBCUTANEOUS

## 2014-01-16 NOTE — Progress Notes (Signed)
ANTIBIOTIC CONSULT NOTE  Pharmacy Consult for meropenem Indication: intra-abdominal infection  No Known Allergies  Patient Measurements: Height:  (157.5 cm) Weight: 169 lb 15.6 oz (77.1 kg) IBW/kg (Calculated) : 54.6 Adjusted Body Weight:   Vital Signs: Temp: 98.3 F (36.8 C) (09/07 0810) Temp src: Oral (09/07 0810) BP: 139/77 mmHg (09/07 0700) Pulse Rate: 101 (09/07 0700) Intake/Output from previous day: 09/06 0701 - 09/07 0700 In: 3672 [I.V.:240; IV Piggyback:1200; ZOX:0960] Out: 2010 [Urine:1975; Drains:35] Intake/Output from this shift: Total I/O In: 103 [I.V.:10; TPN:93] Out: 150 [Urine:150]  Labs:  Recent Labs  01/14/14 0433 01/15/14 0449 01/16/14 0506  WBC 29.0* 27.6* 17.8*  HGB 12.0* 11.2* 10.3*  PLT 470* 473* 487*  CREATININE 0.78 0.77 0.63   Medical History: Past Medical History  Diagnosis Date  . Hypertension   . High cholesterol     Medications:  See EMR  Assessment: 43 yo male s/p ruptured appendix and appendectomy on 8/27, now with intra-abdominal infection, ?peritonitis. Cultures and antibiotics as below.  Zosyn 8/27 >> 9/2 Merrem 9/2 >> Micafungin 9/1 >> Vanc 8/30>>9/1; restart 9/2 >>9/5 Zyvox 9/5 >>  8/27: peritoneal cx- polymicrobial, none predominant 8/30: BCx2 >> ngtd 8/27: Peritoneal fluid-anaerobic >> none 9/5 peritoneal abscess: ngtd 9/5 anaerobic cx: neg 9/6 Malarial smear: pending 9/6 HIV: non-reactive 9/6 Hepatitis panel: neg   Goal of Therapy:  Resolution of infection  Plan:  Continue Merrem 1g IV q8h Continue Zyvox 600 IV q12h Continue Micafungin 100 mg IV q24h Monitor cx, malarial smear, narrow as appropriate   Agapito Games, PharmD, BCPS Clinical Pharmacist Pager: [phone] 01/16/2014 8:42 AM

## 2014-01-16 NOTE — Progress Notes (Addendum)
PARENTERAL NUTRITION CONSULT NOTE - FOLLOW UP  Pharmacy Consult:  TPN Indication: prolonged ileus  No Known Allergies  Patient Measurements: Height:  (157.5 cm) Weight: 169 lb 15.6 oz (77.1 kg) IBW/kg (Calculated) : 54.6 Adjusted Body Weight: 61.2 kg  Vital Signs: Temp: 98.3 F (36.8 C) (09/07 0600) Temp src: Oral (09/07 0600) BP: 139/77 mmHg (09/07 0700) Pulse Rate: 101 (09/07 0700) Intake/Output from previous day: 09/06 0701 - 09/07 0700 In: 3672 [I.V.:240; IV Piggyback:1200; TPN:2232] Out: 2010 [Urine:1975; Drains:35]  Labs:  Recent Labs  01/14/14 0433 01/14/14 1019 01/15/14 0449 01/16/14 0506  WBC 29.0*  --  27.6* 17.8*  HGB 12.0*  --  11.2* 10.3*  HCT 36.4*  --  34.3* 31.3*  PLT 470*  --  473* 487*  INR  --  1.22  --   --      Recent Labs  01/14/14 0433 01/15/14 0449 01/15/14 1500 01/16/14 0506  NA 134* 135*  --  135*  K 3.9 4.3  --  4.5  CL 101 101  --  101  CO2 22 23  --  24  GLUCOSE 144* 144*  --  174*  BUN 17 19  --  19  CREATININE 0.78 0.77  --  0.63  CALCIUM 7.3* 7.6*  --  7.6*  MG  --   --   --  2.1  PHOS  --   --   --  2.9  PROT  --   --  6.2 6.2  ALBUMIN  --   --  1.5* 1.5*  AST  --   --  56* 81*  ALT  --   --  45 65*  ALKPHOS  --   --  100 99  BILITOT  --   --  2.2* 2.0*  BILIDIR  --   --  1.4*  --   IBILI  --   --  0.8  --   TRIG  --   --   --  174*   Estimated Creatinine Clearance: 106 ml/min (by C-G formula based on Cr of 0.63).    Recent Labs  01/15/14 1928 01/16/14 0027 01/16/14 0616  GLUCAP 150* 138* 168*     Insulin Requirements in the past 24 hours:  5 units sensitive SSI  Assessment: 42 YOM s/p laparoscopic appendectomy for gangrenous ruptured appendicitis.  Pharmacy managing TPN for prolonged ileus.  Admit: RLQ abd pain GI: POD#11 appendectomy, perforated viscous >> ileus, NGT to LWIS , prealbumin low at 8.1  LBM 9/3   9/5 CT guided drainage of peritoneal abscess Endo: no hx DM, CBGs acceptable Lytes:  Na 135, K 4.5, CoCa 9.6 Renal: SCr 0.63(stable) -  UOP 1.1 ml/kg/hr Pulm: extubated, 15 L/min on non-rebreather, may require reintubation Cards: HLD / HTN - BP ok, tachycardic  on scheduled hydralazine   Hepatobil:  elevated AST/ALT , TG elevated at 174, tbili 2.0, icterus noted - ok to continue supplementing trace elements Neuro: Fentanyl patch  ID: Current Abx  Meropenem, zyvox and mycamine for appendicitis + peritonitis + abscess s/p drainage 8/27 and 9/5, Tmax 100.1 WBC elevated but trending down   CT chest shows bibasilar PNA, s/p drainage of pelvic fluid and placement of drain Best Practices: heparin SQ, MC, PPI IV TPN Access: triple lumen placed 01/08/14 TPN day#: 7 (8/31 >> )  Current Nutrition:  TPN  Nutritional Goals:  2000-2100 kCal and 100-115gm protein daily   Plan:  - Cont Clinimix E 5/20 at 83 ml/hr +  IVFE at 10 ml/hr.  TPN will provide 2233 kCal and 100gm protein daily, meeting 100% of kCal and 100% of minimal protein needs. - Daily multivitamin.  Change TE to MWF.  Cont to watch tbili - Watch TG and decrease lipid provision as appropriate - F/U AM labs, prealbumin    Talbert Cage PharmD, Pager:  [phone] 01/16/2014, 7:49 AM

## 2014-01-16 NOTE — Progress Notes (Signed)
UR Completed.  Boleslaw Borghi Jane [phone] 01/16/2014  

## 2014-01-16 NOTE — Progress Notes (Signed)
PULMONARY / CRITICAL CARE MEDICINE  Name: Jesus Hunter MRN: [id number] DOB: 09/05/1970    ADMISSION DATE:  01/05/2014 CONSULTATION DATE:  01/08/2014  REFERRING MD :  Catha Gosselin  CHIEF COMPLAINT:  Respiratory distress  INITIAL PRESENTATION: 43 yo presented 8/27 with ruptured appendix, taken to surgery for laparoscopic appendectomy and drainage of intra-abd abscess. On 8/30 developed sepsis and respiratory distress requiring intubation.  STUDIES / EVENTS:  8/27  Abdomen CT >>>  Ruptured appendix  8/30  Sepsis / acute respiratory failure >>> intubated 8/31  Abdomen CT >>> Ileus, mildly distended gallbladder without stones, bibasal atelectasis  9/1    Opioids withdrawal suspected 9/2    Extubated  9/4    Chest / abdomen CT >>> bilateral dense pulmonary infiltrates, several abdominal fluid collections 9/5    Pelvic fluid collection drained by IR which yielded turbid fluid  INTERVAL HISTORY:  Remains febrile, with tenuous respiratory status  VITAL SIGNS: Temp:  [98.3 F (36.8 C)-100.1 F (37.8 C)] 99.1 F (37.3 C) (09/07 1524) Pulse Rate:  [101-131] 111 (09/07 1600) Resp:  [20-35] 25 (09/07 1600) BP: (129-169)/(70-141) 167/94 mmHg (09/07 1600) SpO2:  [98 %-100 %] 98 % (09/07 1600) FiO2 (%):  [45 %-80 %] 45 % (09/07 1300) Weight:  [77.1 kg (169 lb 15.6 oz)] 77.1 kg (169 lb 15.6 oz) (09/07 0413)  HEMODYNAMICS:   VENTILATOR SETTINGS: Vent Mode:  [-]  FiO2 (%):  [45 %-80 %] 45 %  INTAKE / OUTPUT: Intake/Output     09/06 0701 - 09/07 0700 09/07 0701 - 09/08 0700   I.V. (mL/kg) 240 (3.1) 50 (0.6)   Other  70   NG/GT  30   IV Piggyback 1200 500   TPN 2232 744   Total Intake(mL/kg) 3672 (47.6) 1394 (18.1)   Urine (mL/kg/hr) 1975 (1.1) 700 (0.9)   Emesis/NG output     Drains 35 (0)    Total Output 2010 700   Net +1662 +694         PHYSICAL EXAMINATION: General: NAD, no new complaints Neuro:  No focal deficits HEENT:  WNL Cardiovascular: Tachy, reg, no M Lungs:  Bilateral  crackles, no wheezes Abdomen:  Mildly distended, mildly tender throughout, diminished to absent BS Ext: warm without edema  LABS: I have reviewed all of today's lab results. Relevant abnormalities are discussed in the A/P section  CXR: NSC ARDS pattern  ASSESSMENT / PLAN:  PULMONARY A:  Acute hypoxemic respiratory failure ARDS P:   Supplemental oxygen to maintain SpO2>92  CARDIOVASCULAR A: HTN, controlled Sinus tachycardia P:  Cont hydralazine scheduled Cont Hydralazine PRN for hypertension Cont Metoprolol PRN for tachycardia  RENAL A:   Mild hyponatremia, resolved P:   Monitor BMET intermittently Monitor I/Os Correct electrolytes as indicated  GASTROINTESTINAL A:   Ruptured appendicitis Peritonitis s/p appendectomy and peritoneal wasshout 8/27 Pelvic drain placed by IR on 9/5 Post op Ileus P:   Cont NPO Cont TPN CCS primary and managing post op issues  HEMATOLOGIC A:   Mild ICU acquired anemia without acute blood loss P:  DVT px: SQ heparin Monitor CBC intermittently Transfuse per usual ICU guidelines  INFECTIOUS A:   Appendicitis Peritonitis Intra-abdominal abscess  Abscess culture 8/27 >>> polymicrobial ID consult 9/06 P:   Meropenem 9/2 >>> Micafungin 9/1 >>> Zyvox 9/5 >>> Follow abscess culture from 9/5 Abx per ID service  ENDOCRINE A: Hyperglycemia due toTPN P:   Change SSI to q 4 hrs  NEUROLOGIC A:   Severe post op pain Possible  opioid habituation (high tolerance) Anxiety, improved P:   Cont Fentanyl patch Cont Dilaudid PRN DC PRN midaz  I have personally obtained history, examined patient, evaluated and interpreted laboratory and imaging results, reviewed medical records, formulated assessment / plan and placed orders.   Billy Fischer, MD Pulmonary and Critical Care Medicine [facility] Pager: [phone]  01/16/2014, 4:57 PM

## 2014-01-16 NOTE — Progress Notes (Signed)
Seems to be improving WBC decreased Appreciate ID consult Less tachycardic Fever curve down Passing flatus - abd seems less tender  Wilmon Arms. Corliss Skains, MD, [facility] Surgery  General/ Trauma Surgery  01/16/2014 9:22 AM

## 2014-01-16 NOTE — Progress Notes (Signed)
INFECTIOUS DISEASE PROGRESS NOTE  ID: Jesus Hunter is a 43 y.o. male with  Active Problems:   Acute perforated appendicitis   Acute respiratory failure   HTN (hypertension)   Sepsis  Subjective: (seen with family) Continued abd pain, states he still has fever.   Abtx:  Anti-infectives   Start     Dose/Rate Route Frequency Ordered Stop   01/14/14 1000  linezolid (ZYVOX) IVPB 600 mg     600 mg 300 mL/hr over 60 Minutes Intravenous Every 12 hours 01/14/14 0748     01/12/14 1400  vancomycin (VANCOCIN) 1,250 mg in sodium chloride 0.9 % 250 mL IVPB  Status:  Discontinued     1,250 mg 166.7 mL/hr over 90 Minutes Intravenous Every 6 hours 01/12/14 1305 01/14/14 0747   01/11/14 1400  meropenem (MERREM) 1 g in sodium chloride 0.9 % 100 mL IVPB     1 g 200 mL/hr over 30 Minutes Intravenous 3 times per day 01/11/14 1138     01/11/14 1300  vancomycin (VANCOCIN) IVPB 1000 mg/200 mL premix  Status:  Discontinued     1,000 mg 200 mL/hr over 60 Minutes Intravenous Every 8 hours 01/11/14 1233 01/12/14 1305   01/10/14 1500  micafungin (MYCAMINE) 100 mg in sodium chloride 0.9 % 100 mL IVPB     100 mg 100 mL/hr over 1 Hours Intravenous Daily 01/10/14 1405     01/08/14 2200  vancomycin (VANCOCIN) IVPB 1000 mg/200 mL premix  Status:  Discontinued     1,000 mg 200 mL/hr over 60 Minutes Intravenous Every 12 hours 01/08/14 0913 01/10/14 1417   01/08/14 0945  vancomycin (VANCOCIN) 2,000 mg in sodium chloride 0.9 % 500 mL IVPB     2,000 mg 250 mL/hr over 120 Minutes Intravenous  Once 01/08/14 0913 01/08/14 1206   01/05/14 2200  piperacillin-tazobactam (ZOSYN) IVPB 3.375 g  Status:  Discontinued     3.375 g 12.5 mL/hr over 240 Minutes Intravenous 3 times per day 01/05/14 1847 01/11/14 1138   01/05/14 1345  piperacillin-tazobactam (ZOSYN) IVPB 3.375 g     3.375 g 100 mL/hr over 30 Minutes Intravenous  Once 01/05/14 1343 01/05/14 1445      Medications:  Scheduled: . antiseptic oral rinse  7 mL  Mouth Rinse QID  . chlorhexidine  15 mL Mouth Rinse BID  . fentaNYL  75 mcg Transdermal Q72H  . heparin subcutaneous  5,000 Units Subcutaneous 3 times per day  . insulin aspart  0-9 Units Subcutaneous Q4H  . linezolid  600 mg Intravenous Q12H  . meropenem (MERREM) IV  1 g Intravenous 3 times per day  . micafungin (MYCAMINE) IV  100 mg Intravenous Daily    Objective: Vital signs in last 24 hours: Temp:  [98.3 F (36.8 C)-100.1 F (37.8 C)] 98.3 F (36.8 C) (09/07 0810) Pulse Rate:  [101-131] 107 (09/07 1000) Resp:  [21-35] 21 (09/07 1000) BP: (129-169)/(70-141) 154/78 mmHg (09/07 1000) SpO2:  [98 %-100 %] 100 % (09/07 1000) FiO2 (%):  [80 %] 80 % (09/07 1000) Weight:  [77.1 kg (169 lb 15.6 oz)] 77.1 kg (169 lb 15.6 oz) (09/07 0413)   General appearance: alert, cooperative and mild distress Resp: rhonchi bilaterally Cardio: tachycardia GI: normal findings: bowel sounds normal and soft, non-tender and abnormal findings:  distended  Lab Results  Recent Labs  01/15/14 0449 01/16/14 0506  WBC 27.6* 17.8*  HGB 11.2* 10.3*  HCT 34.3* 31.3*  NA 135* 135*  K 4.3 4.5  CL 101  101  CO2 23 24  BUN 19 19  CREATININE 0.77 0.63   Liver Panel  Recent Labs  01/15/14 1500 01/16/14 0506  PROT 6.2 6.2  ALBUMIN 1.5* 1.5*  AST 56* 81*  ALT 45 65*  ALKPHOS 100 99  BILITOT 2.2* 2.0*  BILIDIR 1.4*  --   IBILI 0.8  --    Sedimentation Rate No results found for this basename: ESRSEDRATE,  in the last 72 hours C-Reactive Protein No results found for this basename: CRP,  in the last 72 hours  Microbiology: Recent Results (from the past 240 hour(s))  CULTURE, BLOOD (ROUTINE X 2)     Status: None   Collection Time    01/08/14 12:45 PM      Result Value Ref Range Status   Specimen Description BLOOD LEFT ARM   Final   Special Requests BOTTLES DRAWN AEROBIC ONLY 5CC   Final   Culture  Setup Time     Final   Value: 01/08/2014 18:43     Performed at [facility]    Culture     Final   Value: NO GROWTH 5 DAYS     Performed at [facility]   Report Status 01/14/2014 FINAL   Final  CULTURE, BLOOD (ROUTINE X 2)     Status: None   Collection Time    01/08/14  1:00 PM      Result Value Ref Range Status   Specimen Description BLOOD LEFT HAND   Final   Special Requests BOTTLES DRAWN AEROBIC ONLY 8CC   Final   Culture  Setup Time     Final   Value: 01/08/2014 18:44     Performed at [facility]   Culture     Final   Value: NO GROWTH 5 DAYS     Performed at [facility]   Report Status 01/14/2014 FINAL   Final  MRSA PCR SCREENING     Status: None   Collection Time    01/08/14  5:23 PM      Result Value Ref Range Status   MRSA by PCR NEGATIVE  NEGATIVE Final   Comment:            The GeneXpert MRSA Assay (FDA     approved for NASAL specimens     only), is one component of a     comprehensive MRSA colonization     surveillance program. It is not     intended to diagnose MRSA     infection nor to guide or     monitor treatment for     MRSA infections.  CULTURE, ROUTINE-ABSCESS     Status: None   Collection Time    01/14/14  1:24 PM      Result Value Ref Range Status   Specimen Description PERITONEAL CAVITY   Final   Special Requests Normal   Final   Gram Stain PENDING   Incomplete   Culture     Final   Value: NO GROWTH 2 DAYS     Performed at [facility]   Report Status PENDING   Incomplete  ANAEROBIC CULTURE     Status: None   Collection Time    01/14/14  1:24 PM      Result Value Ref Range Status   Specimen Description PERITONEAL CAVITY   Final   Special Requests Normal   Final   Gram Stain PENDING   Incomplete   Culture  Final   Value: NO ANAEROBES ISOLATED; CULTURE IN PROGRESS FOR 5 DAYS     Performed at [facility]   Report Status PENDING   Incomplete    Studies/Results: Dg Chest Port 1 View  01/16/2014   CLINICAL DATA:  Pneumonia, shortness of breath  EXAM: PORTABLE CHEST - 1 VIEW   COMPARISON:  01/15/2014; 01/13/2014; 01/13/2014; CT the chest, abdomen pelvis - 01/13/2014  FINDINGS: Grossly unchanged cardiac silhouette and mediastinal contours given persistently reduced lung volumes and patient rotation. Stable position of support apparatus. The pulmonary vasculature remains indistinct with cephalization of flow. Grossly unchanged extensive bilateral heterogeneous airspace opacities. Small trace bilateral effusions are unchanged, right greater than left. No pneumothorax. Unchanged bones.  IMPRESSION: 1.  Stable positioning of support apparatus.  No pneumothorax. 2. Grossly unchanged findings of pulmonary edema and extensive bilateral heterogeneous airspace opacities worrisome for multifocal infection. 3. Unchanged small/trace bilateral effusions, right greater than left.   Electronically Signed   By: Simonne Come M.D.   On: 01/16/2014 07:47   Dg Chest Port 1 View  01/15/2014   CLINICAL DATA:  Evaluate for pulmonary infiltrates  EXAM: PORTABLE CHEST - 1 VIEW  COMPARISON:  01/14/2014; 01/13/2014; chest CT -01/13/2014  FINDINGS: Grossly unchanged borderline enlarged cardiac silhouette and mediastinal contours. Stable positioning of support apparatus. There is persistent mild elevation of the right hemidiaphragm. Pulmonary vasculature remains indistinct. Extensive bilateral heterogeneous airspace opacities are unchanged. Interval development of a small / trace right-sided pleural effusion. No new focal airspace opacities. Unchanged bones. Enteric contrast is seen within these splenic flexure of the colon.  IMPRESSION: 1.  Stable positioning of support apparatus.  No pneumothorax. 2. Grossly unchanged findings of pulmonary edema and extensive bilateral heterogeneous airspace opacities worrisome for multifocal infection. 3. Interval increase in small right-sided pleural effusion   Electronically Signed   By: Simonne Come M.D.   On: 01/15/2014 07:24   Ct Image Guided Drainage By Percutaneous  Catheter  01/14/2014   CLINICAL DATA:  Status post appendectomy for ruptured appendicitis. Rising white blood cell count despite antibiotics with CT showing focal fluid collection in the deep pelvis. The patient presents for aspiration with possible catheter drainage of the pelvic fluid collection.  EXAM: CT GUIDED PERCUTANEOUS CATHETER DRAINAGE OF PELVIC PERITONEAL ABSCESS  ANESTHESIA/SEDATION: 0.5 Mg IV Versed 25 mcg IV Fentanyl  Total Moderate Sedation Time:  15 minutes  PROCEDURE: The procedure, risks, benefits, and alternatives were explained to the patient. Questions regarding the procedure were encouraged and answered. The patient understands and consents to the procedure. An interpreter was used.  The right gluteal region was prepped with Betadine in a sterile fashion, and a sterile drape was applied covering the operative field. A sterile gown and sterile gloves were used for the procedure. Local anesthesia was provided with 1% Lidocaine.  The patient was placed in a prone position. CT was performed through the pelvis. Under CT fluoroscopic guidance, an 18 gauge trocar needle was advanced from a right trans gluteal approach into the pelvis. After confirming needle tip position by CT, aspiration of fluid was performed and a sample sent for culture analysis. 15 mL of fluid was drawn off for culture analysis.  A guidewire was advanced through the needle into the collection. The needle was removed. The tract was dilated and a 12 French percutaneous drain placed. Drainage catheter position was confirmed by CT. The catheter was flushed with saline and connected to a suction bulb. It was secured at the skin with  a Prolene retention suture and adhesive StatLock device.  COMPLICATIONS: None  FINDINGS: Needle aspiration at the level of the deep pelvic fluid collection anterior to the rectum yielded yellow colored turbid fluid. Based on appearance of the fluid, decision was made to place a percutaneous drainage  catheter. After drain placement there is continued return of turbid fluid with suction bulb placement.  IMPRESSION: CT-guided drainage of pelvic abscess. Aspiration yielded turbid fluid. A sample was sent for culture analysis. A 12 French drain was placed via a right trans gluteal approach.   Electronically Signed   By: Irish Lack M.D.   On: 01/14/2014 14:09     Assessment/Plan: Fever  Leukocytosis  Ileus  HCAP  Abd fluid collections  Peritonitis  Ruptured, gangrenous appendix (removed 8-27) Protein malnutrition, severe  Total days of antibiotics: 9-5 zyvox  9-2 micafungin  9-1 Merrem  8-27 zosyn 9-1  8-30 vanco 9-4   Would continue his current anbx Temp better, WBC better Await Cx from his drain I expect he will contiue to have rocky course, hopeful he has turned corner?         Jesus Hunter Infectious Diseases (pager) [phone] [email or web address] 01/16/2014, 10:56 AM  LOS: 11 days

## 2014-01-16 NOTE — Progress Notes (Signed)
Patient ID: Jesus Hunter, male   DOB: 11-02-1970, 43 y.o.   MRN: [id number] 11 Days Post-Op  Subjective: Sitting up and seems to feel better.  Seems to understand when I ask if he is passing gas and shakes his head yes  Objective: Vital signs in last 24 hours: Temp:  [98.3 F (36.8 C)-100.1 F (37.8 C)] 98.3 F (36.8 C) (09/07 0810) Pulse Rate:  [101-131] 101 (09/07 0700) Resp:  [21-35] 23 (09/07 0700) BP: (116-162)/(64-93) 139/77 mmHg (09/07 0700) SpO2:  [98 %-100 %] 100 % (09/07 0700) FiO2 (%):  [80 %] 80 % (09/07 0800) Weight:  [169 lb 15.6 oz (77.1 kg)] 169 lb 15.6 oz (77.1 kg) (09/07 0413) Last BM Date: 01/12/14  Intake/Output from previous day: 09/06 0701 - 09/07 0700 In: 3672 [I.V.:240; IV Piggyback:1200; JXB:1478] Out: 2010 [Urine:1975; Drains:35] Intake/Output this shift: Total I/O In: 103 [I.V.:10; TPN:93] Out: 150 [Urine:150]  PE: Abd: softer, some BS, but still with some bilious output from NGT, incisions c/d/i, both JP drains with essentially serous output, slight cloudiness to IR placed drain Heart: tachy, but down from 140s to 1teens  Lab Results:   Recent Labs  01/15/14 0449 01/16/14 0506  WBC 27.6* 17.8*  HGB 11.2* 10.3*  HCT 34.3* 31.3*  PLT 473* 487*   BMET  Recent Labs  01/15/14 0449 01/16/14 0506  NA 135* 135*  K 4.3 4.5  CL 101 101  CO2 23 24  GLUCOSE 144* 174*  BUN 19 19  CREATININE 0.77 0.63  CALCIUM 7.6* 7.6*   PT/INR  Recent Labs  01/14/14 1019  LABPROT 15.4*  INR 1.22   CMP     Component Value Date/Time   NA 135* 01/16/2014 0506   K 4.5 01/16/2014 0506   CL 101 01/16/2014 0506   CO2 24 01/16/2014 0506   GLUCOSE 174* 01/16/2014 0506   BUN 19 01/16/2014 0506   CREATININE 0.63 01/16/2014 0506   CALCIUM 7.6* 01/16/2014 0506   PROT 6.2 01/16/2014 0506   ALBUMIN 1.5* 01/16/2014 0506   AST 81* 01/16/2014 0506   ALT 65* 01/16/2014 0506   ALKPHOS 99 01/16/2014 0506   BILITOT 2.0* 01/16/2014 0506   GFRNONAA >90 01/16/2014 0506   GFRAA >90 01/16/2014  0506   Lipase  No results found for this basename: lipase       Studies/Results: Dg Chest Port 1 View  01/16/2014   CLINICAL DATA:  Pneumonia, shortness of breath  EXAM: PORTABLE CHEST - 1 VIEW  COMPARISON:  01/15/2014; 01/13/2014; 01/13/2014; CT the chest, abdomen pelvis - 01/13/2014  FINDINGS: Grossly unchanged cardiac silhouette and mediastinal contours given persistently reduced lung volumes and patient rotation. Stable position of support apparatus. The pulmonary vasculature remains indistinct with cephalization of flow. Grossly unchanged extensive bilateral heterogeneous airspace opacities. Small trace bilateral effusions are unchanged, right greater than left. No pneumothorax. Unchanged bones.  IMPRESSION: 1.  Stable positioning of support apparatus.  No pneumothorax. 2. Grossly unchanged findings of pulmonary edema and extensive bilateral heterogeneous airspace opacities worrisome for multifocal infection. 3. Unchanged small/trace bilateral effusions, right greater than left.   Electronically Signed   By: Simonne Come M.D.   On: 01/16/2014 07:47   Dg Chest Port 1 View  01/15/2014   CLINICAL DATA:  Evaluate for pulmonary infiltrates  EXAM: PORTABLE CHEST - 1 VIEW  COMPARISON:  01/14/2014; 01/13/2014; chest CT -01/13/2014  FINDINGS: Grossly unchanged borderline enlarged cardiac silhouette and mediastinal contours. Stable positioning of support apparatus. There is persistent mild elevation  of the right hemidiaphragm. Pulmonary vasculature remains indistinct. Extensive bilateral heterogeneous airspace opacities are unchanged. Interval development of a small / trace right-sided pleural effusion. No new focal airspace opacities. Unchanged bones. Enteric contrast is seen within these splenic flexure of the colon.  IMPRESSION: 1.  Stable positioning of support apparatus.  No pneumothorax. 2. Grossly unchanged findings of pulmonary edema and extensive bilateral heterogeneous airspace opacities worrisome  for multifocal infection. 3. Interval increase in small right-sided pleural effusion   Electronically Signed   By: Simonne Come M.D.   On: 01/15/2014 07:24   Ct Image Guided Drainage By Percutaneous Catheter  01/14/2014   CLINICAL DATA:  Status post appendectomy for ruptured appendicitis. Rising white blood cell count despite antibiotics with CT showing focal fluid collection in the deep pelvis. The patient presents for aspiration with possible catheter drainage of the pelvic fluid collection.  EXAM: CT GUIDED PERCUTANEOUS CATHETER DRAINAGE OF PELVIC PERITONEAL ABSCESS  ANESTHESIA/SEDATION: 0.5 Mg IV Versed 25 mcg IV Fentanyl  Total Moderate Sedation Time:  15 minutes  PROCEDURE: The procedure, risks, benefits, and alternatives were explained to the patient. Questions regarding the procedure were encouraged and answered. The patient understands and consents to the procedure. An interpreter was used.  The right gluteal region was prepped with Betadine in a sterile fashion, and a sterile drape was applied covering the operative field. A sterile gown and sterile gloves were used for the procedure. Local anesthesia was provided with 1% Lidocaine.  The patient was placed in a prone position. CT was performed through the pelvis. Under CT fluoroscopic guidance, an 18 gauge trocar needle was advanced from a right trans gluteal approach into the pelvis. After confirming needle tip position by CT, aspiration of fluid was performed and a sample sent for culture analysis. 15 mL of fluid was drawn off for culture analysis.  A guidewire was advanced through the needle into the collection. The needle was removed. The tract was dilated and a 12 French percutaneous drain placed. Drainage catheter position was confirmed by CT. The catheter was flushed with saline and connected to a suction bulb. It was secured at the skin with a Prolene retention suture and adhesive StatLock device.  COMPLICATIONS: None  FINDINGS: Needle aspiration  at the level of the deep pelvic fluid collection anterior to the rectum yielded yellow colored turbid fluid. Based on appearance of the fluid, decision was made to place a percutaneous drainage catheter. After drain placement there is continued return of turbid fluid with suction bulb placement.  IMPRESSION: CT-guided drainage of pelvic abscess. Aspiration yielded turbid fluid. A sample was sent for culture analysis. A 12 French drain was placed via a right trans gluteal approach.   Electronically Signed   By: Irish Lack M.D.   On: 01/14/2014 14:09    Anti-infectives: Anti-infectives   Start     Dose/Rate Route Frequency Ordered Stop   01/14/14 1000  linezolid (ZYVOX) IVPB 600 mg     600 mg 300 mL/hr over 60 Minutes Intravenous Every 12 hours 01/14/14 0748     01/12/14 1400  vancomycin (VANCOCIN) 1,250 mg in sodium chloride 0.9 % 250 mL IVPB  Status:  Discontinued     1,250 mg 166.7 mL/hr over 90 Minutes Intravenous Every 6 hours 01/12/14 1305 01/14/14 0747   01/11/14 1400  meropenem (MERREM) 1 g in sodium chloride 0.9 % 100 mL IVPB     1 g 200 mL/hr over 30 Minutes Intravenous 3 times per day 01/11/14 1138  01/11/14 1300  vancomycin (VANCOCIN) IVPB 1000 mg/200 mL premix  Status:  Discontinued     1,000 mg 200 mL/hr over 60 Minutes Intravenous Every 8 hours 01/11/14 1233 01/12/14 1305   01/10/14 1500  micafungin (MYCAMINE) 100 mg in sodium chloride 0.9 % 100 mL IVPB     100 mg 100 mL/hr over 1 Hours Intravenous Daily 01/10/14 1405     01/08/14 2200  vancomycin (VANCOCIN) IVPB 1000 mg/200 mL premix  Status:  Discontinued     1,000 mg 200 mL/hr over 60 Minutes Intravenous Every 12 hours 01/08/14 0913 01/10/14 1417   01/08/14 0945  vancomycin (VANCOCIN) 2,000 mg in sodium chloride 0.9 % 500 mL IVPB     2,000 mg 250 mL/hr over 120 Minutes Intravenous  Once 01/08/14 0913 01/08/14 1206   01/05/14 2200  piperacillin-tazobactam (ZOSYN) IVPB 3.375 g  Status:  Discontinued     3.375 g 12.5  mL/hr over 240 Minutes Intravenous 3 times per day 01/05/14 1847 01/11/14 1138   01/05/14 1345  piperacillin-tazobactam (ZOSYN) IVPB 3.375 g     3.375 g 100 mL/hr over 30 Minutes Intravenous  Once 01/05/14 1343 01/05/14 1445       Assessment/Plan  s/p Procedure(s):  APPENDECTOMY LAPAROSCOPIC (N/A)  Gangrenous Ruptured Appendicitis---Dr. Lindie Spruce 01/05/14  PCM  POD#11 -continue with IV antibiotics, but WBC finally trending down today to 17K along with his fever curve  ileus - >1L/24hrs (didn't document 9-6), NGT to LWIS, but having some bowel sounds, but not quite sure he is ready for clamping trial just yet, maybe tomorrow -SCD/lovenox  -dc foley, only placed for IR procedure -drain care(serous)  -cultures-multiple organisms present from OR -Culture from IR, no growth to date -zosyn 8/27-9/2. Vanc 8/30---> dc today Micafungin 9/1---> Meropenem 9/2---> Zyvox -->  - Given quality of output from IR drain - not sure if this is what is driving wbc.  -TPN  -ID consult appreciated, will check dopplers today of his lower extremities.  Will d/w MD about DC IJ and placing PICC VDRF  -appreciate CCM management  -above abx therapy for PNA  AKI-resolved, monitor renal function HTN-hydralazine scheduled, PRN labetalol  Dispo-continue ICU     LOS: 11 days    Jesus Hunter 01/16/2014, 8:50 AM Pager: [phone]

## 2014-01-17 ENCOUNTER — Inpatient Hospital Stay (HOSPITAL_COMMUNITY): Payer: BC Managed Care – PPO

## 2014-01-17 DIAGNOSIS — J9589 Other postprocedural complications and disorders of respiratory system, not elsewhere classified: Secondary | ICD-10-CM

## 2014-01-17 LAB — CBC WITH DIFFERENTIAL/PLATELET
Basophils Absolute: 0.1 10*3/uL (ref 0.0–0.1)
Basophils Relative: 1 % (ref 0–1)
Eosinophils Absolute: 0.1 10*3/uL (ref 0.0–0.7)
Eosinophils Relative: 1 % (ref 0–5)
HCT: 30.8 % — ABNORMAL LOW (ref 39.0–52.0)
Hemoglobin: 10 g/dL — ABNORMAL LOW (ref 13.0–17.0)
Lymphocytes Relative: 14 % (ref 12–46)
Lymphs Abs: 1.8 10*3/uL (ref 0.7–4.0)
MCH: 27 pg (ref 26.0–34.0)
MCHC: 32.5 g/dL (ref 30.0–36.0)
MCV: 83.2 fL (ref 78.0–100.0)
MONO ABS: 1 10*3/uL (ref 0.1–1.0)
MONOS PCT: 8 % (ref 3–12)
NEUTROS PCT: 76 % (ref 43–77)
Neutro Abs: 9.5 10*3/uL — ABNORMAL HIGH (ref 1.7–7.7)
PLATELETS: 519 10*3/uL — AB (ref 150–400)
RBC: 3.7 MIL/uL — AB (ref 4.22–5.81)
RDW: 13.7 % (ref 11.5–15.5)
WBC: 12.5 10*3/uL — AB (ref 4.0–10.5)

## 2014-01-17 LAB — BASIC METABOLIC PANEL
ANION GAP: 12 (ref 5–15)
BUN: 20 mg/dL (ref 6–23)
CALCIUM: 8 mg/dL — AB (ref 8.4–10.5)
CHLORIDE: 101 meq/L (ref 96–112)
CO2: 26 meq/L (ref 19–32)
Creatinine, Ser: 0.63 mg/dL (ref 0.50–1.35)
GFR calc non Af Amer: >90 mL/min (ref >90)
Glucose, Bld: 138 mg/dL — ABNORMAL HIGH (ref 70–99)
Potassium: 4.1 mEq/L (ref 3.7–5.3)
SODIUM: 139 meq/L (ref 137–147)

## 2014-01-17 LAB — CULTURE, ROUTINE-ABSCESS
Culture: NO GROWTH
Gram Stain: NONE SEEN
Special Requests: NORMAL

## 2014-01-17 LAB — GLUCOSE, CAPILLARY
GLUCOSE-CAPILLARY: 134 mg/dL — AB (ref 70–99)
GLUCOSE-CAPILLARY: 122 mg/dL — AB (ref 70–99)
Glucose-Capillary: 111 mg/dL — ABNORMAL HIGH (ref 70–99)
Glucose-Capillary: 122 mg/dL — ABNORMAL HIGH (ref 70–99)
Glucose-Capillary: 133 mg/dL — ABNORMAL HIGH (ref 70–99)
Glucose-Capillary: 138 mg/dL — ABNORMAL HIGH (ref 70–99)
Glucose-Capillary: 142 mg/dL — ABNORMAL HIGH (ref 70–99)

## 2014-01-17 MED ORDER — M.V.I. ADULT IV INJ
INTRAVENOUS | Status: AC
  Administered 2014-01-17: 18:00:00 via INTRAVENOUS
  Filled 2014-01-17: qty 2000

## 2014-01-17 MED ORDER — FAT EMULSION 20 % IV EMUL
250.0000 mL | INTRAVENOUS | Status: AC
  Administered 2014-01-17: 250 mL via INTRAVENOUS
  Filled 2014-01-17: qty 250

## 2014-01-17 MED ORDER — TRACE MINERALS CR-CU-F-FE-I-MN-MO-SE-ZN IV SOLN
INTRAVENOUS | Status: AC
  Administered 2014-01-17: 18:00:00 via INTRAVENOUS
  Filled 2014-01-17: qty 2000

## 2014-01-17 MED ORDER — ENOXAPARIN SODIUM 40 MG/0.4ML ~~LOC~~ SOLN
40.0000 mg | SUBCUTANEOUS | Status: DC
  Administered 2014-01-17 – 2014-01-25 (×9): 40 mg via SUBCUTANEOUS
  Filled 2014-01-17 (×10): qty 0.4

## 2014-01-17 NOTE — Progress Notes (Signed)
I have seen and examined the patient and agree with the assessment and plans. Slowly improving.  Checking  KUB today.  Tereka Thorley A. Magnus Ivan  MD, FACS

## 2014-01-17 NOTE — Progress Notes (Signed)
PULMONARY / CRITICAL CARE MEDICINE  Name: Jesus Hunter MRN: [id number] DOB: 1971-04-04    ADMISSION DATE:  01/05/2014 CONSULTATION DATE:  01/08/2014  REFERRING MD :  Catha Gosselin  CHIEF COMPLAINT:  Respiratory distress  INITIAL PRESENTATION: 43 yo presented 8/27 with ruptured appendix, taken to surgery for laparoscopic appendectomy and drainage of intra-abd abscess. On 8/30 developed sepsis and respiratory distress requiring intubation.  STUDIES / EVENTS:  8/27  Abdomen CT >>>  Ruptured appendix  8/30  Sepsis / acute respiratory failure >>> intubated 8/31  Abdomen CT >>> Ileus, mildly distended gallbladder without stones, bibasal atelectasis  9/1    Opioids withdrawal suspected 9/2    Extubated  9/4    Chest / abdomen CT >>> bilateral dense pulmonary infiltrates, several abdominal fluid collections 9/5    Pelvic fluid collection drained by IR which yielded turbid fluid 9/08  Tolerating [place] O2. Trf to SDU ordered  SUBJ:  No new complaints. No distress. Now on [place] O2   VITAL SIGNS: Temp:  [99.1 F (37.3 C)-100.1 F (37.8 C)] 99.3 F (37.4 C) (09/08 1222) Pulse Rate:  [86-125] 103 (09/08 1300) Resp:  [19-31] 28 (09/08 1300) BP: (140-179)/(86-103) 164/96 mmHg (09/08 1300) SpO2:  [93 %-98 %] 97 % (09/08 1300) Weight:  [75.9 kg (167 lb 5.3 oz)] 75.9 kg (167 lb 5.3 oz) (09/08 0358)  HEMODYNAMICS:   VENTILATOR SETTINGS:    INTAKE / OUTPUT: Intake/Output     09/07 0701 - 09/08 0700 09/08 0701 - 09/09 0700   I.V. (mL/kg) 210 (2.8) 20 (0.3)   Other 160    NG/GT 30    IV Piggyback 1000 400   TPN 2232 279   Total Intake(mL/kg) 3632 (47.9) 699 (9.2)   Urine (mL/kg/hr) 1610 (0.9) 500 (1)   Emesis/NG output 1950 (1.1)    Drains 25 (0)    Total Output 3585 500   Net +47 +199         PHYSICAL EXAMINATION: General: NAD Neuro:  No focal deficits HEENT:  WNL Cardiovascular: Tachy, reg, no M Lungs:  Bilateral crackles, no wheezes Abdomen: less distended, mildly tender throughout,  diminished to absent BS Ext: warm without edema  LABS: I have reviewed all of today's lab results. Relevant abnormalities are discussed in the A/P section  CXR: NSC  ASSESSMENT / PLAN: GASTROINTESTINAL A:   Ruptured appendicitis Peritonitis s/p appendectomy and peritoneal wasshout 8/27 Pelvic drain placed by IR on 9/5 Post op Ileus P:   Cont TPN CCS primary and managing post op issues  INFECTIOUS A:   Appendicitis Peritonitis Intra-abdominal abscess  Abscess culture 8/27 >>> polymicrobial ID consult 9/06 P:   Meropenem 9/2 >>> Micafungin 9/1 >>> Zyvox 9/5 >>> Abx per ID service  PULMONARY A:  Acute hypoxemic respiratory failure ARDS P:   Supplemental oxygen to maintain SpO2>92 OK to transfer to SDU Recheck CXR in a couple of days to ensure resolution of pulm infitrates  CARDIOVASCULAR A: HTN, controlled Sinus tachycardia P:  Cont Hydralazine PRN for hypertension Cont Metoprolol PRN for tachycardia  RENAL A:   Mild hyponatremia, resolved P:   Monitor BMET intermittently Monitor I/Os Correct electrolytes as indicated  HEMATOLOGIC A:   Mild ICU acquired anemia without acute blood loss P:  DVT px: SQ LMWH Monitor CBC intermittently Transfuse per usual ICU guidelines  ENDOCRINE A: Mild hyperglycemia due toTPN P:   Cont SSI   NEUROLOGIC A:   Severe post op pain - improved Possible opioid habituation (high tolerance) Anxiety, improved P:  Cont Fentanyl patch Cont Dilaudid PRN DC PRN midaz  I have personally obtained history, examined patient, evaluated and interpreted laboratory and imaging results, reviewed medical records, formulated assessment / plan and placed orders.   Billy Fischer, MD Pulmonary and Critical Care Medicine [facility] Pager: [phone]  01/17/2014, 1:34 PM

## 2014-01-17 NOTE — Progress Notes (Signed)
PARENTERAL NUTRITION CONSULT NOTE - FOLLOW UP  Pharmacy Consult:  TPN Indication: prolonged ileus  No Known Allergies  Patient Measurements: Height:  (157.5 cm) Weight: 167 lb 5.3 oz (75.9 kg) IBW/kg (Calculated) : 54.6 Adjusted Body Weight: 61.2 kg  Vital Signs: Temp: 99.8 F (37.7 C) (09/08 0408) Temp src: Oral (09/08 0408) BP: 160/91 mmHg (09/08 0600) Pulse Rate: 96 (09/08 0700) Intake/Output from previous day: 09/07 0701 - 09/08 0700 In: 3529 [I.V.:200; NG/GT:30; IV Piggyback:1000; TPN:2139] Out: 3385 [Urine:1410; Emesis/NG output:1950; Drains:25]  Labs:  Recent Labs  01/14/14 1019 01/15/14 0449 01/16/14 0506 01/17/14 0400  WBC  --  27.6* 17.8* 12.5*  HGB  --  11.2* 10.3* 10.0*  HCT  --  34.3* 31.3* 30.8*  PLT  --  473* 487* 519*  INR 1.22  --   --   --      Recent Labs  01/15/14 0449 01/15/14 1500 01/16/14 0506 01/17/14 0400  NA 135*  --  135* 139  K 4.3  --  4.5 4.1  CL 101  --  101 101  CO2 23  --  24 26  GLUCOSE 144*  --  174* 138*  BUN 19  --  19 20  CREATININE 0.77  --  0.63 0.63  CALCIUM 7.6*  --  7.6* 8.0*  MG  --   --  2.1  --   PHOS  --   --  2.9  --   PROT  --  6.2 6.2  --   ALBUMIN  --  1.5* 1.5*  --   AST  --  56* 81*  --   ALT  --  45 65*  --   ALKPHOS  --  100 99  --   BILITOT  --  2.2* 2.0*  --   BILIDIR  --  1.4*  --   --   IBILI  --  0.8  --   --   PREALBUMIN  --   --  9.5*  --   TRIG  --   --  174*  --    Estimated Creatinine Clearance: 105.2 ml/min (by C-G formula based on Cr of 0.63).    Recent Labs  01/16/14 1956 01/16/14 2340 01/17/14 0407  GLUCAP 140* 142* 134*     Insulin Requirements in the past 24 hours:  8 units sensitive SSI  Assessment: 42 YOM s/p laparoscopic appendectomy for gangrenous ruptured appendicitis.  Pharmacy managing TPN for prolonged ileus.  Admit: RLQ abd pain GI: POD#12 appendectomy, perforated viscous >> ileus, NGT to LWIS , prealbumin low at 8.1  LBM 9/3   9/5 CT guided  drainage of peritoneal abscess, passing flatus, 9/7 Pre albumin up to 9.5. Checking KUB today  Endo: no hx DM, CBGs controlled  Lytes: Na 139, K 4.5, Phos 2.9, Mg 2.1, CoCa ~10 Renal: SCr 0.63(stable) -  UOP 0.8 ml/kg/hr Pulm: extubated, Down to 2L today   Cards: HLD / HTN - BP ok, tachycardic  on scheduled hydralazine   Hepatobil:  elevated AST/ALT , TG elevated at 174, tbili 2.0, icterus noted - ok to continue supplementing trace elements Neuro: Fentanyl patch  ID: Current Abx  Meropenem, zyvox and mycamine for appendicitis + peritonitis + abscess s/p drainage 8/27 and 9/5, Tmax 100.1 WBC elevated but trending down   CT chest shows bibasilar PNA, s/p drainage of pelvic fluid and placement of drain. IR drain cloudy but cultures remain ngtd  Best Practices: heparin SQ, MC, PPI IV TPN  Access: triple lumen placed 01/08/14 TPN day#: 8 (8/31 >> )  Current Nutrition:  TPN  Nutritional Goals:  2000-2100 kCal and 100-115gm protein daily   Plan:  - Cont Clinimix E 5/20 at 83 ml/hr + IVFE at 10 ml/hr.  TPN will provide 2233 kCal and 100gm protein daily, meeting 100% of kCal and 100% of minimal protein needs. - Daily multivitamin.  Change TE to MWF.  Cont to watch tbili - Watch TG and decrease lipid provision as appropriate - F/U AM labs, prealbumin    Vinnie Level, PharmD.  Clinical Pharmacist Pager [phone]

## 2014-01-17 NOTE — Progress Notes (Addendum)
INFECTIOUS DISEASE PROGRESS NOTE  ID: Jesus Hunter is a 43 y.o. male with  Active Problems:   Acute perforated appendicitis   Acute respiratory failure   HTN (hypertension)   Sepsis   Severe sepsis(995.92)   ARDS (adult respiratory distress syndrome)  Subjective: Awake and alert, no distress.   Abtx:  Anti-infectives   Start     Dose/Rate Route Frequency Ordered Stop   01/14/14 1000  linezolid (ZYVOX) IVPB 600 mg     600 mg 300 mL/hr over 60 Minutes Intravenous Every 12 hours 01/14/14 0748     01/12/14 1400  vancomycin (VANCOCIN) 1,250 mg in sodium chloride 0.9 % 250 mL IVPB  Status:  Discontinued     1,250 mg 166.7 mL/hr over 90 Minutes Intravenous Every 6 hours 01/12/14 1305 01/14/14 0747   01/11/14 1400  meropenem (MERREM) 1 g in sodium chloride 0.9 % 100 mL IVPB     1 g 200 mL/hr over 30 Minutes Intravenous 3 times per day 01/11/14 1138     01/11/14 1300  vancomycin (VANCOCIN) IVPB 1000 mg/200 mL premix  Status:  Discontinued     1,000 mg 200 mL/hr over 60 Minutes Intravenous Every 8 hours 01/11/14 1233 01/12/14 1305   01/10/14 1500  micafungin (MYCAMINE) 100 mg in sodium chloride 0.9 % 100 mL IVPB     100 mg 100 mL/hr over 1 Hours Intravenous Daily 01/10/14 1405     01/08/14 2200  vancomycin (VANCOCIN) IVPB 1000 mg/200 mL premix  Status:  Discontinued     1,000 mg 200 mL/hr over 60 Minutes Intravenous Every 12 hours 01/08/14 0913 01/10/14 1417   01/08/14 0945  vancomycin (VANCOCIN) 2,000 mg in sodium chloride 0.9 % 500 mL IVPB     2,000 mg 250 mL/hr over 120 Minutes Intravenous  Once 01/08/14 0913 01/08/14 1206   01/05/14 2200  piperacillin-tazobactam (ZOSYN) IVPB 3.375 g  Status:  Discontinued     3.375 g 12.5 mL/hr over 240 Minutes Intravenous 3 times per day 01/05/14 1847 01/11/14 1138   01/05/14 1345  piperacillin-tazobactam (ZOSYN) IVPB 3.375 g     3.375 g 100 mL/hr over 30 Minutes Intravenous  Once 01/05/14 1343 01/05/14 1445      Medications:   Scheduled: . antiseptic oral rinse  7 mL Mouth Rinse QID  . chlorhexidine  15 mL Mouth Rinse BID  . fentaNYL  75 mcg Transdermal Q72H  . heparin subcutaneous  5,000 Units Subcutaneous 3 times per day  . insulin aspart  0-9 Units Subcutaneous Q4H  . linezolid  600 mg Intravenous Q12H  . meropenem (MERREM) IV  1 g Intravenous 3 times per day  . micafungin (MYCAMINE) IV  100 mg Intravenous Daily    Objective: Vital signs in last 24 hours: Temp:  [99.1 F (37.3 C)-100.1 F (37.8 C)] 99.1 F (37.3 C) (09/08 0825) Pulse Rate:  [86-125] 96 (09/08 0700) Resp:  [19-29] 29 (09/08 0700) BP: (140-179)/(78-103) 179/95 mmHg (09/08 0700) SpO2:  [95 %-100 %] 95 % (09/08 0700) FiO2 (%):  [45 %-80 %] 45 % (09/07 1300) Weight:  [75.9 kg (167 lb 5.3 oz)] 75.9 kg (167 lb 5.3 oz) (09/08 0358)   General appearance: alert, cooperative and no distress Resp: diminished breath sounds anterior - bilateral Cardio: regular rate and rhythm GI: normal findings: bowel sounds normal and soft, non-tender  Lab Results  Recent Labs  01/16/14 0506 01/17/14 0400  WBC 17.8* 12.5*  HGB 10.3* 10.0*  HCT 31.3* 30.8*  NA 135*  139  K 4.5 4.1  CL 101 101  CO2 24 26  BUN 19 20  CREATININE 0.63 0.63   Liver Panel  Recent Labs  01/15/14 1500 01/16/14 0506  PROT 6.2 6.2  ALBUMIN 1.5* 1.5*  AST 56* 81*  ALT 45 65*  ALKPHOS 100 99  BILITOT 2.2* 2.0*  BILIDIR 1.4*  --   IBILI 0.8  --    Sedimentation Rate No results found for this basename: ESRSEDRATE,  in the last 72 hours C-Reactive Protein No results found for this basename: CRP,  in the last 72 hours  Microbiology: Recent Results (from the past 240 hour(s))  CULTURE, BLOOD (ROUTINE X 2)     Status: None   Collection Time    01/08/14 12:45 PM      Result Value Ref Range Status   Specimen Description BLOOD LEFT ARM   Final   Special Requests BOTTLES DRAWN AEROBIC ONLY 5CC   Final   Culture  Setup Time     Final   Value: 01/08/2014 18:43      Performed at [facility]   Culture     Final   Value: NO GROWTH 5 DAYS     Performed at [facility]   Report Status 01/14/2014 FINAL   Final  CULTURE, BLOOD (ROUTINE X 2)     Status: None   Collection Time    01/08/14  1:00 PM      Result Value Ref Range Status   Specimen Description BLOOD LEFT HAND   Final   Special Requests BOTTLES DRAWN AEROBIC ONLY 8CC   Final   Culture  Setup Time     Final   Value: 01/08/2014 18:44     Performed at [facility]   Culture     Final   Value: NO GROWTH 5 DAYS     Performed at [facility]   Report Status 01/14/2014 FINAL   Final  MRSA PCR SCREENING     Status: None   Collection Time    01/08/14  5:23 PM      Result Value Ref Range Status   MRSA by PCR NEGATIVE  NEGATIVE Final   Comment:            The GeneXpert MRSA Assay (FDA     approved for NASAL specimens     only), is one component of a     comprehensive MRSA colonization     surveillance program. It is not     intended to diagnose MRSA     infection nor to guide or     monitor treatment for     MRSA infections.  CULTURE, ROUTINE-ABSCESS     Status: None   Collection Time    01/14/14  1:24 PM      Result Value Ref Range Status   Specimen Description PERITONEAL CAVITY   Final   Special Requests Normal   Final   Gram Stain     Final   Value: NO WBC SEEN     NO SQUAMOUS EPITHELIAL CELLS SEEN     NO ORGANISMS SEEN     Performed at [facility]   Culture     Final   Value: NO GROWTH 3 DAYS     Performed at [facility]   Report Status 01/17/2014 FINAL   Final  ANAEROBIC CULTURE     Status: None   Collection Time    01/14/14  1:24 PM  Result Value Ref Range Status   Specimen Description PERITONEAL CAVITY   Final   Special Requests Normal   Final   Gram Stain PENDING   Incomplete   Culture     Final   Value: NO ANAEROBES ISOLATED; CULTURE IN PROGRESS FOR 5 DAYS     Performed at [facility]   Report Status  PENDING   Incomplete  MALARIA SMEAR     Status: None   Collection Time    01/16/14 12:00 PM      Result Value Ref Range Status   Specimen Description BLOOD   Final   Special Requests NONE   Final   Malaria Prep     Final   Value: Negative pending thick smear review     Performed at [facility]   Report Status PENDING   Incomplete    Studies/Results: Dg Chest Port 1 View  01/17/2014   CLINICAL DATA:  Followup respiratory failure.  EXAM: PORTABLE CHEST - 1 VIEW  COMPARISON:  01/16/2014.  FINDINGS: There are patchy areas of confluence, as well as irregular interstitial opacities in both lungs, most prominent in the left upper lobe. Lung volumes are low. Lung opacities are similar to the previous day's study allowing for lower lung volumes and the different patient positioning on the current exam.  No pneumothorax.  Left internal jugular central venous line and nasogastric tube are stable and well positioned.  IMPRESSION: 1. No significant change from the previous day's study. 2. Persistent bilateral areas of lung confluent opacity and interstitial densities most likely due to multifocal pneumonia.   Electronically Signed   By: Amie Portland M.D.   On: 01/17/2014 07:42   Dg Chest Port 1 View  01/16/2014   CLINICAL DATA:  Pneumonia, shortness of breath  EXAM: PORTABLE CHEST - 1 VIEW  COMPARISON:  01/15/2014; 01/13/2014; 01/13/2014; CT the chest, abdomen pelvis - 01/13/2014  FINDINGS: Grossly unchanged cardiac silhouette and mediastinal contours given persistently reduced lung volumes and patient rotation. Stable position of support apparatus. The pulmonary vasculature remains indistinct with cephalization of flow. Grossly unchanged extensive bilateral heterogeneous airspace opacities. Small trace bilateral effusions are unchanged, right greater than left. No pneumothorax. Unchanged bones.  IMPRESSION: 1.  Stable positioning of support apparatus.  No pneumothorax. 2. Grossly unchanged findings of  pulmonary edema and extensive bilateral heterogeneous airspace opacities worrisome for multifocal infection. 3. Unchanged small/trace bilateral effusions, right greater than left.   Electronically Signed   By: Simonne Come M.D.   On: 01/16/2014 07:47     Assessment/Plan: Fever  Leukocytosis  Ileus  HCAP  Abd fluid collections  Peritonitis  Ruptured, gangrenous appendix (removed 8-27)  Protein malnutrition, severe  Total days of antibiotics:  9-5 zyvox  9-2 micafungin  9-1 Merrem  8-27 zosyn 9-1  8-30 vanco 9-4   WBC better, temp down.  Would aim for 3 weeks of rx? Will need f/u films of his abd collections         [name] Infectious Diseases (pager) [phone] [email or web address] 01/17/2014, 8:43 AM  LOS: 12 days

## 2014-01-17 NOTE — Progress Notes (Signed)
12 Days Post-Op  Subjective: Rt TG abscess drain placed 9/5 Wbc trending down Pt some better  Objective: Vital signs in last 24 hours: Temp:  [99.1 F (37.3 C)-100.1 F (37.8 C)] 99.1 F (37.3 C) (09/08 0825) Pulse Rate:  [86-125] 95 (09/08 1100) Resp:  [19-31] 25 (09/08 1100) BP: (140-179)/(81-103) 161/96 mmHg (09/08 1100) SpO2:  [93 %-99 %] 98 % (09/08 1100) FiO2 (%):  [45 %-55 %] 45 % (09/07 1300) Weight:  [75.9 kg (167 lb 5.3 oz)] 75.9 kg (167 lb 5.3 oz) (09/08 0358) Last BM Date: 01/12/14  Intake/Output from previous day: 09/07 0701 - 09/08 0700 In: 3632 [I.V.:210; NG/GT:30; IV Piggyback:1000; TPN:2232] Out: 3585 [Urine:1610; Emesis/NG output:1950; Drains:25] Intake/Output this shift: Total I/O In: 1099 [I.V.:20; NG/GT:400; IV Piggyback:400; TPN:279] Out: 225 [Urine:225]  PE:  T: 99.1 Stable 15 cc output yesterday 15 cc in JP: cloudy yellow Cx neg  Site clean and dry; Tender   Lab Results:   Recent Labs  01/16/14 0506 01/17/14 0400  WBC 17.8* 12.5*  HGB 10.3* 10.0*  HCT 31.3* 30.8*  PLT 487* 519*   BMET  Recent Labs  01/16/14 0506 01/17/14 0400  NA 135* 139  K 4.5 4.1  CL 101 101  CO2 24 26  GLUCOSE 174* 138*  BUN 19 20  CREATININE 0.63 0.63  CALCIUM 7.6* 8.0*   PT/INR No results found for this basename: LABPROT, INR,  in the last 72 hours ABG No results found for this basename: PHART, PCO2, PO2, HCO3,  in the last 72 hours  Studies/Results: Dg Chest Port 1 View  01/17/2014   CLINICAL DATA:  Followup respiratory failure.  EXAM: PORTABLE CHEST - 1 VIEW  COMPARISON:  01/16/2014.  FINDINGS: There are patchy areas of confluence, as well as irregular interstitial opacities in both lungs, most prominent in the left upper lobe. Lung volumes are low. Lung opacities are similar to the previous day's study allowing for lower lung volumes and the different patient positioning on the current exam.  No pneumothorax.  Left internal jugular central venous  line and nasogastric tube are stable and well positioned.  IMPRESSION: 1. No significant change from the previous day's study. 2. Persistent bilateral areas of lung confluent opacity and interstitial densities most likely due to multifocal pneumonia.   Electronically Signed   By: Amie Portland M.D.   On: 01/17/2014 07:42   Dg Chest Port 1 View  01/16/2014   CLINICAL DATA:  Pneumonia, shortness of breath  EXAM: PORTABLE CHEST - 1 VIEW  COMPARISON:  01/15/2014; 01/13/2014; 01/13/2014; CT the chest, abdomen pelvis - 01/13/2014  FINDINGS: Grossly unchanged cardiac silhouette and mediastinal contours given persistently reduced lung volumes and patient rotation. Stable position of support apparatus. The pulmonary vasculature remains indistinct with cephalization of flow. Grossly unchanged extensive bilateral heterogeneous airspace opacities. Small trace bilateral effusions are unchanged, right greater than left. No pneumothorax. Unchanged bones.  IMPRESSION: 1.  Stable positioning of support apparatus.  No pneumothorax. 2. Grossly unchanged findings of pulmonary edema and extensive bilateral heterogeneous airspace opacities worrisome for multifocal infection. 3. Unchanged small/trace bilateral effusions, right greater than left.   Electronically Signed   By: Simonne Come M.D.   On: 01/16/2014 07:47   Dg Abd Portable 1v  01/17/2014   CLINICAL DATA:  NG tube placement, ileus.  EXAM: PORTABLE ABDOMEN - 1 VIEW  COMPARISON:  01/13/2014  FINDINGS: NG tube tip is difficult to visualize but appears to be within the fundus of the stomach, partially  obscured due to motion. Gas within a few scattered prominent small bowel loops and throughout the colon, not significantly changed since prior study. No free air.  IMPRESSION: NG tube tip is obscured by motion but appears to be in the gastric fundus. No change in bowel gas pattern   Electronically Signed   By: Charlett Nose M.D.   On: 01/17/2014 09:06     Anti-infectives: Anti-infectives   Start     Dose/Rate Route Frequency Ordered Stop   01/14/14 1000  linezolid (ZYVOX) IVPB 600 mg     600 mg 300 mL/hr over 60 Minutes Intravenous Every 12 hours 01/14/14 0748     01/12/14 1400  vancomycin (VANCOCIN) 1,250 mg in sodium chloride 0.9 % 250 mL IVPB  Status:  Discontinued     1,250 mg 166.7 mL/hr over 90 Minutes Intravenous Every 6 hours 01/12/14 1305 01/14/14 0747   01/11/14 1400  meropenem (MERREM) 1 g in sodium chloride 0.9 % 100 mL IVPB     1 g 200 mL/hr over 30 Minutes Intravenous 3 times per day 01/11/14 1138     01/11/14 1300  vancomycin (VANCOCIN) IVPB 1000 mg/200 mL premix  Status:  Discontinued     1,000 mg 200 mL/hr over 60 Minutes Intravenous Every 8 hours 01/11/14 1233 01/12/14 1305   01/10/14 1500  micafungin (MYCAMINE) 100 mg in sodium chloride 0.9 % 100 mL IVPB     100 mg 100 mL/hr over 1 Hours Intravenous Daily 01/10/14 1405     01/08/14 2200  vancomycin (VANCOCIN) IVPB 1000 mg/200 mL premix  Status:  Discontinued     1,000 mg 200 mL/hr over 60 Minutes Intravenous Every 12 hours 01/08/14 0913 01/10/14 1417   01/08/14 0945  vancomycin (VANCOCIN) 2,000 mg in sodium chloride 0.9 % 500 mL IVPB     2,000 mg 250 mL/hr over 120 Minutes Intravenous  Once 01/08/14 0913 01/08/14 1206   01/05/14 2200  piperacillin-tazobactam (ZOSYN) IVPB 3.375 g  Status:  Discontinued     3.375 g 12.5 mL/hr over 240 Minutes Intravenous 3 times per day 01/05/14 1847 01/11/14 1138   01/05/14 1345  piperacillin-tazobactam (ZOSYN) IVPB 3.375 g     3.375 g 100 mL/hr over 30 Minutes Intravenous  Once 01/05/14 1343 01/05/14 1445      Assessment/Plan: s/p Procedure(s): APPENDECTOMY LAPAROSCOPIC (N/A)  Pelvic abscess drain intact Some better Output slowing cx neg Will follow Need re CT when output 10cc/day    LOS: 12 days    Tim Corriher A 01/17/2014

## 2014-01-17 NOTE — Progress Notes (Signed)
Patient ID: Jesus Hunter, male   DOB: 1970/09/22, 43 y.o.   MRN: [id number] 12 Days Post-Op  Subjective: Patient continues to look better.  Down to 2L [place].  Says he passed flatus yesterday.  No pain  Objective: Vital signs in last 24 hours: Temp:  [99.1 F (37.3 C)-100.1 F (37.8 C)] 99.1 F (37.3 C) (09/08 0825) Pulse Rate:  [86-125] 96 (09/08 0700) Resp:  [19-29] 29 (09/08 0700) BP: (140-179)/(78-103) 179/95 mmHg (09/08 0700) SpO2:  [95 %-100 %] 95 % (09/08 0700) FiO2 (%):  [45 %-80 %] 45 % (09/07 1300) Weight:  [167 lb 5.3 oz (75.9 kg)] 167 lb 5.3 oz (75.9 kg) (09/08 0358) Last BM Date: 01/12/14  Intake/Output from previous day: 09/07 0701 - 09/08 0700 In: 3632 [I.V.:210; NG/GT:30; IV Piggyback:1000; TPN:2232] Out: 3585 [Urine:1610; Emesis/NG output:1950; Drains:25] Intake/Output this shift: Total I/O In: 103 [I.V.:10; TPN:93] Out: 225 [Urine:225]  PE: Gen: NAD Heart: regular, no longer tachy Lungs: moving air much better with less effort Abd: soft, minimal distention, +BS, NGT still with close to 1000cc yesterday, surgical drain with clear serous output.  IR drain with cloudy drainage, but no growth on cx, incisions c/d/i  Lab Results:   Recent Labs  01/16/14 0506 01/17/14 0400  WBC 17.8* 12.5*  HGB 10.3* 10.0*  HCT 31.3* 30.8*  PLT 487* 519*   BMET  Recent Labs  01/16/14 0506 01/17/14 0400  NA 135* 139  K 4.5 4.1  CL 101 101  CO2 24 26  GLUCOSE 174* 138*  BUN 19 20  CREATININE 0.63 0.63  CALCIUM 7.6* 8.0*   PT/INR  Recent Labs  01/14/14 1019  LABPROT 15.4*  INR 1.22   CMP     Component Value Date/Time   NA 139 01/17/2014 0400   K 4.1 01/17/2014 0400   CL 101 01/17/2014 0400   CO2 26 01/17/2014 0400   GLUCOSE 138* 01/17/2014 0400   BUN 20 01/17/2014 0400   CREATININE 0.63 01/17/2014 0400   CALCIUM 8.0* 01/17/2014 0400   PROT 6.2 01/16/2014 0506   ALBUMIN 1.5* 01/16/2014 0506   AST 81* 01/16/2014 0506   ALT 65* 01/16/2014 0506   ALKPHOS 99 01/16/2014 0506   BILITOT 2.0* 01/16/2014 0506   GFRNONAA >90 01/17/2014 0400   GFRAA >90 01/17/2014 0400   Lipase  No results found for this basename: lipase       Studies/Results: Dg Chest Port 1 View  01/17/2014   CLINICAL DATA:  Followup respiratory failure.  EXAM: PORTABLE CHEST - 1 VIEW  COMPARISON:  01/16/2014.  FINDINGS: There are patchy areas of confluence, as well as irregular interstitial opacities in both lungs, most prominent in the left upper lobe. Lung volumes are low. Lung opacities are similar to the previous day's study allowing for lower lung volumes and the different patient positioning on the current exam.  No pneumothorax.  Left internal jugular central venous line and nasogastric tube are stable and well positioned.  IMPRESSION: 1. No significant change from the previous day's study. 2. Persistent bilateral areas of lung confluent opacity and interstitial densities most likely due to multifocal pneumonia.   Electronically Signed   By: Amie Portland M.D.   On: 01/17/2014 07:42   Dg Chest Port 1 View  01/16/2014   CLINICAL DATA:  Pneumonia, shortness of breath  EXAM: PORTABLE CHEST - 1 VIEW  COMPARISON:  01/15/2014; 01/13/2014; 01/13/2014; CT the chest, abdomen pelvis - 01/13/2014  FINDINGS: Grossly unchanged cardiac silhouette and mediastinal contours given  persistently reduced lung volumes and patient rotation. Stable position of support apparatus. The pulmonary vasculature remains indistinct with cephalization of flow. Grossly unchanged extensive bilateral heterogeneous airspace opacities. Small trace bilateral effusions are unchanged, right greater than left. No pneumothorax. Unchanged bones.  IMPRESSION: 1.  Stable positioning of support apparatus.  No pneumothorax. 2. Grossly unchanged findings of pulmonary edema and extensive bilateral heterogeneous airspace opacities worrisome for multifocal infection. 3. Unchanged small/trace bilateral effusions, right greater than left.   Electronically Signed    By: Simonne Come M.D.   On: 01/16/2014 07:47    Anti-infectives: Anti-infectives   Start     Dose/Rate Route Frequency Ordered Stop   01/14/14 1000  linezolid (ZYVOX) IVPB 600 mg     600 mg 300 mL/hr over 60 Minutes Intravenous Every 12 hours 01/14/14 0748     01/12/14 1400  vancomycin (VANCOCIN) 1,250 mg in sodium chloride 0.9 % 250 mL IVPB  Status:  Discontinued     1,250 mg 166.7 mL/hr over 90 Minutes Intravenous Every 6 hours 01/12/14 1305 01/14/14 0747   01/11/14 1400  meropenem (MERREM) 1 g in sodium chloride 0.9 % 100 mL IVPB     1 g 200 mL/hr over 30 Minutes Intravenous 3 times per day 01/11/14 1138     01/11/14 1300  vancomycin (VANCOCIN) IVPB 1000 mg/200 mL premix  Status:  Discontinued     1,000 mg 200 mL/hr over 60 Minutes Intravenous Every 8 hours 01/11/14 1233 01/12/14 1305   01/10/14 1500  micafungin (MYCAMINE) 100 mg in sodium chloride 0.9 % 100 mL IVPB     100 mg 100 mL/hr over 1 Hours Intravenous Daily 01/10/14 1405     01/08/14 2200  vancomycin (VANCOCIN) IVPB 1000 mg/200 mL premix  Status:  Discontinued     1,000 mg 200 mL/hr over 60 Minutes Intravenous Every 12 hours 01/08/14 0913 01/10/14 1417   01/08/14 0945  vancomycin (VANCOCIN) 2,000 mg in sodium chloride 0.9 % 500 mL IVPB     2,000 mg 250 mL/hr over 120 Minutes Intravenous  Once 01/08/14 0913 01/08/14 1206   01/05/14 2200  piperacillin-tazobactam (ZOSYN) IVPB 3.375 g  Status:  Discontinued     3.375 g 12.5 mL/hr over 240 Minutes Intravenous 3 times per day 01/05/14 1847 01/11/14 1138   01/05/14 1345  piperacillin-tazobactam (ZOSYN) IVPB 3.375 g     3.375 g 100 mL/hr over 30 Minutes Intravenous  Once 01/05/14 1343 01/05/14 1445       Assessment/Plan s/p Procedure(s):  APPENDECTOMY LAPAROSCOPIC (N/A)  Gangrenous Ruptured Appendicitis---Dr. Lindie Spruce 01/05/14  PCM  POD#12  -continue with IV antibiotics, but WBC conts to trend down to 12K. Fever curve down significantly too ileus - >1L/24hrs, NGT to LWIS,  but having some bowel sounds, will check KUB today to evaluate NGT position and bowel gas pattern -SCD/lovenox  -drain care(serous), Ir drain is cloudy, but culture still with no growth to date  -cultures-multiple organisms present from OR  -zosyn 8/27-9/2. Vanc 8/30---> dc today Micafungin 9/1---> Meropenem 9/2---> Zyvox -->  -TPN  -ID consult appreciated, will check dopplers today of his lower extremities. VDRF  -appreciate CCM management  -above abx therapy for PNA  -on [place] with 2L AKI-resolved, monitor renal function  HTN-hydralazine scheduled, PRN labetalol, tachycardia much improved Dispo-continue ICU      LOS: 12 days    Allante Beane E 01/17/2014, 8:37 AM Pager: [phone]

## 2014-01-17 NOTE — Evaluation (Signed)
Physical Therapy Evaluation Patient Details Name: Jesus Hunter MRN: [id number] DOB: 1970-09-06 Today's Date: 01/17/2014   History of Present Illness    43 yo presented 8/27 with ruptured appendix, taken to surgery for laparoscopic appendectomy and drainage of intra-abd abscess. On 8/30 developed sepsis and respiratory distress requiring intubation.  8/31 Abdomen CT >>> Ileus, mildly distended gallbladder without stones, bibasal atelectasis  9/1 Opioids withdrawal suspected  9/2 Extubated  9/4 Chest / abdomen CT >>> bilateral dense pulmonary infiltrates, several abdominal fluid collections  9/5 Pelvic fluid collection drained by IR which yielded turbid fluid   Clinical Impression  Pt admitted with above. Pt currently with functional limitations due to the deficits listed below (see PT Problem List).  Pt will benefit from skilled PT to increase their independence and safety with mobility to allow discharge to the venue listed below.     Follow Up Recommendations Home health PT;Supervision/Assistance - 24 hour    Equipment Recommendations  Other (comment) (TBA)    Recommendations for Other Services       Precautions / Restrictions Precautions Precautions: Fall Restrictions Weight Bearing Restrictions: No      Mobility  Bed Mobility Overal bed mobility: Needs Assistance;+2 for physical assistance Bed Mobility: Supine to Sit     Supine to sit: Min assist        Transfers Overall transfer level: Needs assistance Equipment used: 1 person hand held assist Transfers: Sit to/from Stand Sit to Stand: Min guard         General transfer comment:  slight steadying assist needed.   Ambulation/Gait                Stairs            Wheelchair Mobility    Modified Rankin (Stroke Patients Only)       Balance Overall balance assessment: Needs assistance Sitting-balance support: No upper extremity supported;Feet supported Sitting balance-Leahy Scale: Good      Standing balance support: Single extremity supported;During functional activity Standing balance-Leahy Scale: Fair Standing balance comment: can stand statically without use of hands.  cannot tolerate challenges to balance.                              Pertinent Vitals/Pain Pain Assessment: Faces Faces Pain Scale: Hurts little more Pain Location: abdomen and back Pain Descriptors / Indicators: Aching Pain Intervention(s): Limited activity within patient's tolerance;Monitored during session;Repositioned VSS    Home Living Family/patient expects to be discharged to:: Private residence Living Arrangements: Spouse/significant other;Children Available Help at Discharge: Family;Available PRN/intermittently           Home Equipment: None      Prior Function Level of Independence: Independent               Hand Dominance        Extremity/Trunk Assessment   Upper Extremity Assessment: Defer to OT evaluation           Lower Extremity Assessment: Generalized weakness      Cervical / Trunk Assessment: Normal  Communication   Communication: Prefers language other than English ([place])  Cognition Arousal/Alertness: Awake/alert Behavior During Therapy: Flat affect Overall Cognitive Status: Within Functional Limits for tasks assessed                      General Comments      Exercises General Exercises - Lower Extremity Ankle Circles/Pumps: AROM;Both;10 reps;Seated Long Arc Quad:  AROM;Both;10 reps;Seated Hip ABduction/ADduction: AROM;Both;10 reps;Standing Hip Flexion/Marching: AROM;Both;10 reps;Standing Toe Raises: AROM;Both;10 reps;Standing      Assessment/Plan    PT Assessment Patient needs continued PT services  PT Diagnosis Generalized weakness;Acute pain   PT Problem List Decreased activity tolerance;Decreased balance;Decreased mobility;Decreased knowledge of use of DME;Decreased safety awareness;Decreased knowledge of  precautions;Pain  PT Treatment Interventions Gait training;DME instruction;Functional mobility training;Therapeutic activities;Therapeutic exercise;Balance training;Patient/family education   PT Goals (Current goals can be found in the Care Plan section) Acute Rehab PT Goals Patient Stated Goal: to go home PT Goal Formulation: With patient Time For Goal Achievement: 01/24/14 Potential to Achieve Goals: Good    Frequency Min 3X/week   Barriers to discharge        Co-evaluation               End of Session Equipment Utilized During Treatment: Gait belt;Oxygen Activity Tolerance: Patient tolerated treatment well Patient left: in bed;with call bell/phone within reach Nurse Communication: Mobility status         Time: 1030-1050 PT Time Calculation (min): 20 min   Charges:   PT Evaluation $Initial PT Evaluation Tier I: 1 Procedure PT Treatments $Therapeutic Activity: 8-22 mins   PT G Codes:          INGOLD,Jesus Hunter January 21, 2014, 11:38 AM [facility] [phone] (pager)

## 2014-01-18 LAB — CBC WITH DIFFERENTIAL/PLATELET
Basophils Absolute: 0.1 10*3/uL (ref 0.0–0.1)
Basophils Relative: 1 % (ref 0–1)
EOS PCT: 1 % (ref 0–5)
Eosinophils Absolute: 0.1 10*3/uL (ref 0.0–0.7)
HEMATOCRIT: 29.8 % — AB (ref 39.0–52.0)
Hemoglobin: 9.6 g/dL — ABNORMAL LOW (ref 13.0–17.0)
LYMPHS ABS: 2 10*3/uL (ref 0.7–4.0)
Lymphocytes Relative: 16 % (ref 12–46)
MCH: 26.6 pg (ref 26.0–34.0)
MCHC: 32.2 g/dL (ref 30.0–36.0)
MCV: 82.5 fL (ref 78.0–100.0)
MONO ABS: 1 10*3/uL (ref 0.1–1.0)
Monocytes Relative: 8 % (ref 3–12)
NEUTROS ABS: 9.2 10*3/uL — AB (ref 1.7–7.7)
Neutrophils Relative %: 74 % (ref 43–77)
Platelets: 502 10*3/uL — ABNORMAL HIGH (ref 150–400)
RBC: 3.61 MIL/uL — ABNORMAL LOW (ref 4.22–5.81)
RDW: 13.3 % (ref 11.5–15.5)
WBC: 12.4 10*3/uL — AB (ref 4.0–10.5)

## 2014-01-18 LAB — BASIC METABOLIC PANEL
Anion gap: 11 (ref 5–15)
BUN: 19 mg/dL (ref 6–23)
CO2: 25 meq/L (ref 19–32)
Calcium: 7.6 mg/dL — ABNORMAL LOW (ref 8.4–10.5)
Chloride: 102 mEq/L (ref 96–112)
Creatinine, Ser: 0.62 mg/dL (ref 0.50–1.35)
GFR calc non Af Amer: >90 mL/min (ref >90)
GLUCOSE: 120 mg/dL — AB (ref 70–99)
POTASSIUM: 4.4 meq/L (ref 3.7–5.3)
Sodium: 138 mEq/L (ref 137–147)

## 2014-01-18 LAB — MALARIA SMEAR

## 2014-01-18 LAB — GLUCOSE, CAPILLARY
GLUCOSE-CAPILLARY: 127 mg/dL — AB (ref 70–99)
GLUCOSE-CAPILLARY: 118 mg/dL — AB (ref 70–99)
Glucose-Capillary: 132 mg/dL — ABNORMAL HIGH (ref 70–99)
Glucose-Capillary: 119 mg/dL — ABNORMAL HIGH (ref 70–99)
Glucose-Capillary: 135 mg/dL — ABNORMAL HIGH (ref 70–99)
Glucose-Capillary: 118 mg/dL — ABNORMAL HIGH (ref 70–99)

## 2014-01-18 MED ORDER — BOOST / RESOURCE BREEZE PO LIQD
1.0000 | Freq: Three times a day (TID) | ORAL | Status: DC
  Administered 2014-01-18 – 2014-01-25 (×19): 1 via ORAL

## 2014-01-18 MED ORDER — TRACE MINERALS CR-CU-F-FE-I-MN-MO-SE-ZN IV SOLN
INTRAVENOUS | Status: AC
  Administered 2014-01-18: 17:00:00 via INTRAVENOUS
  Filled 2014-01-18: qty 2000

## 2014-01-18 MED ORDER — FAT EMULSION 20 % IV EMUL
250.0000 mL | INTRAVENOUS | Status: AC
  Administered 2014-01-18: 250 mL via INTRAVENOUS
  Filled 2014-01-18: qty 250

## 2014-01-18 NOTE — Progress Notes (Signed)
NUTRITION FOLLOW UP  Intervention:    Continue TPN dosing per Pharmacy to meet 100% of estimated nutrition needs.  Diet advancement as tolerated per MD. Resource Breeze PO TID, each supplement provides 250 kcal and 9 grams of protein  Nutrition Dx:   Inadequate oral intake related to altered GI function as evidenced by clear liquid diet. Ongoing.  Goal:   Intake to meet >90% of estimated nutrition needs. Met.  Monitor:   TPN tolerance/adequacy, weight trend, labs, diet advancement and tolerance.  Assessment:   43 year old [place] male who presented with 4 days of RLQ abdominal pain. CT of abdomen and pelvis showed ruptured appendicitis and possible abscess. S/P lap appendectomy with drainage of intra-abdominal abscess.   Patient is receiving TPN with Clinimix E 5/20 @ 83 ml/hr and lipids @ 10 ml/hr. Provides 2232 ml, 2233 kcal, and 100 grams protein per day. Meets 100% minimum estimated energy needs and 100% minimum estimated protein needs.  Diet has been advanced to clear liquids. Patient is able to tolerate some of the clear liquids, but overall intake is still poor. TPN is meeting nutrition needs.  Height: Ht Readings from Last 1 Encounters:  01/08/14 '5\' 2"'  (1.575 m)    Weight Status:   Wt Readings from Last 1 Encounters:  01/17/14 167 lb 5.3 oz (75.9 kg)  01/11/14  180 lb 1.9 oz (81.7 kg)  01/09/14  168 lb 14 oz (76.6 kg)   Re-estimated needs:  Kcal: 2000-2100  Protein: 100-115 gm  Fluid: >/= 2 L  Skin: surgical incision to abdomen  Diet Order: Clear Liquid   Intake/Output Summary (Last 24 hours) at 01/18/14 1206 Last data filed at 01/18/14 1000  Gross per 24 hour  Intake    610 ml  Output 1787.5 ml  Net -1177.5 ml    Last BM: 9/3   Labs:   Recent Labs Lab 01/12/14 0400 01/13/14 0415  01/16/14 0506 01/17/14 0400 01/18/14 0500  NA 137 136*  < > 135* 139 138  K 3.1* 3.6*  < > 4.5 4.1 4.4  CL 100 102  < > 101 101 102  CO2 23 20  < > '24 26 25   ' BUN 17 16  < > '19 20 19  ' CREATININE 0.77 0.77  < > 0.63 0.63 0.62  CALCIUM 7.6* 7.3*  < > 7.6* 8.0* 7.6*  MG 1.7 1.8  --  2.1  --   --   PHOS 2.3 2.8  --  2.9  --   --   GLUCOSE 122* 121*  < > 174* 138* 120*  < > = values in this interval not displayed.  CBG (last 3)   Recent Labs  01/17/14 2339 01/18/14 0416 01/18/14 0813  GLUCAP 111* 127* 132*    Scheduled Meds: . antiseptic oral rinse  7 mL Mouth Rinse QID  . chlorhexidine  15 mL Mouth Rinse BID  . enoxaparin (LOVENOX) injection  40 mg Subcutaneous Q24H  . fentaNYL  75 mcg Transdermal Q72H  . insulin aspart  0-9 Units Subcutaneous Q4H  . linezolid  600 mg Intravenous Q12H  . meropenem (MERREM) IV  1 g Intravenous 3 times per day  . micafungin [facility]) IV  100 mg Intravenous Daily    Continuous Infusions: . sodium chloride 10 mL/hr at 01/17/14 0555  . Marland KitchenTPN (CLINIMIX-E) Adult 83 mL/hr at 01/17/14 1820   And  . fat emulsion 250 mL (01/17/14 1820)  . Marland KitchenTPN (CLINIMIX-E) Adult     And  .  fat emulsion      Molli Barrows, RD, LDN, [place] Pager [phone] After Hours Pager [phone]

## 2014-01-18 NOTE — Progress Notes (Signed)
PARENTERAL NUTRITION CONSULT NOTE - FOLLOW UP  Pharmacy Consult:  TPN Indication: prolonged ileus  No Known Allergies  Patient Measurements: Height:  (157.5 cm) Weight: 167 lb 5.3 oz (75.9 kg) IBW/kg (Calculated) : 54.6 Adjusted Body Weight: 61.2 kg  Vital Signs: Temp: 99.3 F (37.4 C) (09/09 0417) Temp src: Oral (09/09 0417) BP: 165/98 mmHg (09/09 0700) Pulse Rate: 100 (09/09 0700) Intake/Output from previous day: 09/08 0701 - 09/09 0700 In: 1309 [I.V.:20; IV Piggyback:1000; TPN:279] Out: 1550 [Urine:1525; Drains:25]  Labs:  Recent Labs  01/16/14 0506 01/17/14 0400 01/18/14 0500  WBC 17.8* 12.5* 12.4*  HGB 10.3* 10.0* 9.6*  HCT 31.3* 30.8* 29.8*  PLT 487* 519* 502*     Recent Labs  01/15/14 1500 01/16/14 0506 01/17/14 0400 01/18/14 0500  NA  --  135* 139 138  K  --  4.5 4.1 4.4  CL  --  101 101 102  CO2  --  GLUCOSE  --  174* 138* 120*  BUN  --  CREATININE  --  0.63 0.63 0.62  CALCIUM  --  7.6* 8.0* 7.6*  MG  --  2.1  --   --   PHOS  --  2.9  --   --   PROT 6.2 6.2  --   --   ALBUMIN 1.5* 1.5*  --   --   AST 56* 81*  --   --   ALT 45 65*  --   --   ALKPHOS 100 99  --   --   BILITOT 2.2* 2.0*  --   --   BILIDIR 1.4*  --   --   --   IBILI 0.8  --   --   --   PREALBUMIN  --  9.5*  --   --   TRIG  --  174*  --   --    Estimated Creatinine Clearance: 105.2 ml/min (by C-G formula based on Cr of 0.62).    Recent Labs  01/17/14 2106 01/17/14 2339 01/18/14 0416  GLUCAP 122* 111* 127*     Insulin Requirements in the past 24 hours:  6 units sensitive SSI  Assessment: 42 YOM s/p laparoscopic appendectomy for gangrenous ruptured appendicitis.  Pharmacy managing TPN for prolonged ileus.  Admit: RLQ abd pain GI: POD#13 appendectomy, perforated viscous >> ileus, NGT to LWIS , prealbumin low at 8.1  LBM 9/3   9/5 CT guided drainage of peritoneal abscess, passing flatus, 9/7 Pre albumin up to 9.5. Starting clear liquids today    Endo: no hx DM, CBGs controlled  Lytes: Na 138, K 4.4, Phos 2.9, Mg 2.1, CoCa ~10 Renal: SCr 0.62(stable) -  UOP 0.8 ml/kg/hr Pulm: extubated, Down to 2L today   Cards: HLD / HTN - BP elevated, tachycardic on scheduled hydralazine   Hepatobil:  elevated AST/ALT , TG elevated at 174, tbili 2.0, icterus noted - ok to continue supplementing trace elements Neuro: Fentanyl patch  ID: Current Abx  Meropenem, zyvox and mycamine for appendicitis + peritonitis + abscess s/p drainage 8/27 and 9/5, Tmax 100 WBC elevated but trending down,   CT chest shows bibasilar PNA, s/p drainage of pelvic fluid and placement of drain. IR drain cloudy but cultures remain ngtd  Best Practices: Lovenox, MC TPN Access: triple lumen placed 01/08/14 TPN day#: 9 (8/31 >> )  Current Nutrition:  TPN  Nutritional Goals:  2000-2100 kCal and 100-115gm protein daily   Plan:  -  Cont Clinimix E 5/20 at 83 ml/hr + IVFE at 10 ml/hr.  TPN will provide 2233 kCal and 100gm protein daily, meeting 100% of kCal and 100% of minimal protein needs. - Daily multivitamin. TE on MWF.  Cont to watch tbili - Watch TG and decrease lipid provision as appropriate - F/U AM labs, prealbumin - F/u patient's tolerance of clear liquid diet and further diet advancement.     Vinnie Level, PharmD.  Clinical Pharmacist Pager [phone]

## 2014-01-18 NOTE — Progress Notes (Signed)
13 Days Post-Op  Subjective: Rt TG abscess drain placed 9/5 Output slowing Pt feels better   Objective: Vital signs in last 24 hours: Temp:  [98.1 F (36.7 C)-100 F (37.8 C)] 99.3 F (37.4 C) (09/09 0417) Pulse Rate:  [91-109] 100 (09/09 0700) Resp:  [20-31] 27 (09/09 0700) BP: (150-172)/(86-102) 165/98 mmHg (09/09 0700) SpO2:  [93 %-99 %] 94 % (09/09 0700) Last BM Date: 01/12/14  Intake/Output from previous day: 09/08 0701 - 09/09 0700 In: 1309 [I.V.:20; IV Piggyback:1000; TPN:279] Out: 1550 [Urine:1525; Drains:25] Intake/Output this shift:    PE:  afeb now T max 100 Site of TG drain clean and dry; tender Output cloudy yellow Minimal in JP now 25 cc yesterday Cx: neg Wbc stable at 12.4  Lab Results:   Recent Labs  01/17/14 0400 01/18/14 0500  WBC 12.5* 12.4*  HGB 10.0* 9.6*  HCT 30.8* 29.8*  PLT 519* 502*   BMET  Recent Labs  01/17/14 0400 01/18/14 0500  NA 139 138  K 4.1 4.4  CL 101 102  CO2 26 25  GLUCOSE 138* 120*  BUN 20 19  CREATININE 0.63 0.62  CALCIUM 8.0* 7.6*   PT/INR No results found for this basename: LABPROT, INR,  in the last 72 hours ABG No results found for this basename: PHART, PCO2, PO2, HCO3,  in the last 72 hours  Studies/Results: Dg Chest Port 1 View  01/17/2014   CLINICAL DATA:  Followup respiratory failure.  EXAM: PORTABLE CHEST - 1 VIEW  COMPARISON:  01/16/2014.  FINDINGS: There are patchy areas of confluence, as well as irregular interstitial opacities in both lungs, most prominent in the left upper lobe. Lung volumes are low. Lung opacities are similar to the previous day's study allowing for lower lung volumes and the different patient positioning on the current exam.  No pneumothorax.  Left internal jugular central venous line and nasogastric tube are stable and well positioned.  IMPRESSION: 1. No significant change from the previous day's study. 2. Persistent bilateral areas of lung confluent opacity and interstitial  densities most likely due to multifocal pneumonia.   Electronically Signed   By: Amie Portland M.D.   On: 01/17/2014 07:42   Dg Abd Portable 1v  01/17/2014   CLINICAL DATA:  NG tube placement, ileus.  EXAM: PORTABLE ABDOMEN - 1 VIEW  COMPARISON:  01/13/2014  FINDINGS: NG tube tip is difficult to visualize but appears to be within the fundus of the stomach, partially obscured due to motion. Gas within a few scattered prominent small bowel loops and throughout the colon, not significantly changed since prior study. No free air.  IMPRESSION: NG tube tip is obscured by motion but appears to be in the gastric fundus. No change in bowel gas pattern   Electronically Signed   By: Charlett Nose M.D.   On: 01/17/2014 09:06    Anti-infectives: Anti-infectives   Start     Dose/Rate Route Frequency Ordered Stop   01/14/14 1000  linezolid (ZYVOX) IVPB 600 mg     600 mg 300 mL/hr over 60 Minutes Intravenous Every 12 hours 01/14/14 0748     01/12/14 1400  vancomycin (VANCOCIN) 1,250 mg in sodium chloride 0.9 % 250 mL IVPB  Status:  Discontinued     1,250 mg 166.7 mL/hr over 90 Minutes Intravenous Every 6 hours 01/12/14 1305 01/14/14 0747   01/11/14 1400  meropenem (MERREM) 1 g in sodium chloride 0.9 % 100 mL IVPB     1 g 200 mL/hr  over 30 Minutes Intravenous 3 times per day 01/11/14 1138     01/11/14 1300  vancomycin (VANCOCIN) IVPB 1000 mg/200 mL premix  Status:  Discontinued     1,000 mg 200 mL/hr over 60 Minutes Intravenous Every 8 hours 01/11/14 1233 01/12/14 1305   01/10/14 1500  micafungin (MYCAMINE) 100 mg in sodium chloride 0.9 % 100 mL IVPB     100 mg 100 mL/hr over 1 Hours Intravenous Daily 01/10/14 1405     01/08/14 2200  vancomycin (VANCOCIN) IVPB 1000 mg/200 mL premix  Status:  Discontinued     1,000 mg 200 mL/hr over 60 Minutes Intravenous Every 12 hours 01/08/14 0913 01/10/14 1417   01/08/14 0945  vancomycin (VANCOCIN) 2,000 mg in sodium chloride 0.9 % 500 mL IVPB     2,000 mg 250 mL/hr  over 120 Minutes Intravenous  Once 01/08/14 0913 01/08/14 1206   01/05/14 2200  piperacillin-tazobactam (ZOSYN) IVPB 3.375 g  Status:  Discontinued     3.375 g 12.5 mL/hr over 240 Minutes Intravenous 3 times per day 01/05/14 1847 01/11/14 1138   01/05/14 1345  piperacillin-tazobactam (ZOSYN) IVPB 3.375 g     3.375 g 100 mL/hr over 30 Minutes Intravenous  Once 01/05/14 1343 01/05/14 1445      Assessment/Plan: s/p Procedure(s): APPENDECTOMY LAPAROSCOPIC (N/A)  Rt pelvic abscess; TG drain placed 9/5 Feeling some better Still npo Drain intact Plan per [facility]   LOS: 13 days    Yara Tomkinson A 01/18/2014

## 2014-01-18 NOTE — Progress Notes (Addendum)
13 Days Post-Op  Subjective: Comfortable No nausea or vomiting  Objective: Vital signs in last 24 hours: Temp:  [98.1 F (36.7 C)-100 F (37.8 C)] 98.5 F (36.9 C) (09/09 0700) Pulse Rate:  [91-109] 100 (09/09 0700) Resp:  [20-31] 27 (09/09 0700) BP: (154-172)/(86-99) 165/98 mmHg (09/09 0700) SpO2:  [94 %-99 %] 94 % (09/09 0700) Last BM Date: 01/12/14  Intake/Output from previous day: 09/08 0701 - 09/09 0700 In: 1309 [I.V.:20; IV Piggyback:1000; TPN:279] Out: 1550 [Urine:1525; Drains:25] Intake/Output this shift: Total I/O In: -  Out: 262.5 [Urine:250; Drains:12.5]  Abdomen soft, non distended, minimally tender  Lab Results:   Recent Labs  01/17/14 0400 01/18/14 0500  WBC 12.5* 12.4*  HGB 10.0* 9.6*  HCT 30.8* 29.8*  PLT 519* 502*   BMET  Recent Labs  01/17/14 0400 01/18/14 0500  NA 139 138  K 4.1 4.4  CL 101 102  CO2 26 25  GLUCOSE 138* 120*  BUN 20 19  CREATININE 0.63 0.62  CALCIUM 8.0* 7.6*   PT/INR No results found for this basename: LABPROT, INR,  in the last 72 hours ABG No results found for this basename: PHART, PCO2, PO2, HCO3,  in the last 72 hours  Studies/Results: Dg Chest Port 1 View  01/17/2014   CLINICAL DATA:  Followup respiratory failure.  EXAM: PORTABLE CHEST - 1 VIEW  COMPARISON:  01/16/2014.  FINDINGS: There are patchy areas of confluence, as well as irregular interstitial opacities in both lungs, most prominent in the left upper lobe. Lung volumes are low. Lung opacities are similar to the previous day's study allowing for lower lung volumes and the different patient positioning on the current exam.  No pneumothorax.  Left internal jugular central venous line and nasogastric tube are stable and well positioned.  IMPRESSION: 1. No significant change from the previous day's study. 2. Persistent bilateral areas of lung confluent opacity and interstitial densities most likely due to multifocal pneumonia.   Electronically Signed   By: Amie Portland M.D.   On: 01/17/2014 07:42   Dg Abd Portable 1v  01/17/2014   CLINICAL DATA:  NG tube placement, ileus.  EXAM: PORTABLE ABDOMEN - 1 VIEW  COMPARISON:  01/13/2014  FINDINGS: NG tube tip is difficult to visualize but appears to be within the fundus of the stomach, partially obscured due to motion. Gas within a few scattered prominent small bowel loops and throughout the colon, not significantly changed since prior study. No free air.  IMPRESSION: NG tube tip is obscured by motion but appears to be in the gastric fundus. No change in bowel gas pattern   Electronically Signed   By: Charlett Nose M.D.   On: 01/17/2014 09:06    Anti-infectives: Anti-infectives   Start     Dose/Rate Route Frequency Ordered Stop   01/14/14 1000  linezolid (ZYVOX) IVPB 600 mg     600 mg 300 mL/hr over 60 Minutes Intravenous Every 12 hours 01/14/14 0748     01/12/14 1400  vancomycin (VANCOCIN) 1,250 mg in sodium chloride 0.9 % 250 mL IVPB  Status:  Discontinued     1,250 mg 166.7 mL/hr over 90 Minutes Intravenous Every 6 hours 01/12/14 1305 01/14/14 0747   01/11/14 1400  meropenem (MERREM) 1 g in sodium chloride 0.9 % 100 mL IVPB     1 g 200 mL/hr over 30 Minutes Intravenous 3 times per day 01/11/14 1138     01/11/14 1300  vancomycin (VANCOCIN) IVPB 1000 mg/200 mL premix  Status:  Discontinued     1,000 mg 200 mL/hr over 60 Minutes Intravenous Every 8 hours 01/11/14 1233 01/12/14 1305   01/10/14 1500  micafungin (MYCAMINE) 100 mg in sodium chloride 0.9 % 100 mL IVPB     100 mg 100 mL/hr over 1 Hours Intravenous Daily 01/10/14 1405     01/08/14 2200  vancomycin (VANCOCIN) IVPB 1000 mg/200 mL premix  Status:  Discontinued     1,000 mg 200 mL/hr over 60 Minutes Intravenous Every 12 hours 01/08/14 0913 01/10/14 1417   01/08/14 0945  vancomycin (VANCOCIN) 2,000 mg in sodium chloride 0.9 % 500 mL IVPB     2,000 mg 250 mL/hr over 120 Minutes Intravenous  Once 01/08/14 0913 01/08/14 1206   01/05/14 2200   piperacillin-tazobactam (ZOSYN) IVPB 3.375 g  Status:  Discontinued     3.375 g 12.5 mL/hr over 240 Minutes Intravenous 3 times per day 01/05/14 1847 01/11/14 1138   01/05/14 1345  piperacillin-tazobactam (ZOSYN) IVPB 3.375 g     3.375 g 100 mL/hr over 30 Minutes Intravenous  Once 01/05/14 1343 01/05/14 1445      Assessment/Plan: s/p Procedure(s): APPENDECTOMY LAPAROSCOPIC (N/A)  Start clear liquids Continue IV antibiotics Continuing TNA for malnutrition until tolerating PO   LOS: 13 days    Avacyn Kloosterman A 01/18/2014

## 2014-01-18 NOTE — Progress Notes (Signed)
Physical Therapy Treatment Patient Details Name: Jesus Hunter MRN: [id number] DOB: 08-27-1970 Today's Date: 01/18/2014    History of Present Illness   43 yo presented 8/27 with ruptured appendix, taken to surgery for laparoscopic appendectomy and drainage of intra-abd abscess. On 8/30 developed sepsis and respiratory distress requiring intubation.  8/31 Abdomen CT >>> Ileus, mildly distended gallbladder without stones, bibasal atelectasis  9/1 Opioids withdrawal suspected  9/2 Extubated  9/4 Chest / abdomen CT >>> bilateral dense pulmonary infiltrates, several abdominal fluid collections  9/5 Pelvic fluid collection drained by IR which yielded turbid fluid      PT Comments    Patient demonstrates improvements in overall function today. Ambulated in hall with minimal assist, one extended rest break secondary to elevated HR. Performed multiple functional tasks in room with min guard. Still demonstrates instability with higher level tasks. Will continue to see and progress as tolerated.  If patient continues to progress well, may have no follow up needs. Will update as appropriate.   Follow Up Recommendations  Home health PT;Supervision/Assistance - 24 hour     Equipment Recommendations  Other (comment) (TBA)    Recommendations for Other Services       Precautions / Restrictions Precautions Precautions: Fall Restrictions Weight Bearing Restrictions: No    Mobility  Bed Mobility Overal bed mobility: Needs Assistance;+2 for physical assistance Bed Mobility: Supine to Sit     Supine to sit: Min assist        Transfers Overall transfer level: Needs assistance Equipment used: 1 person hand held assist Transfers: Sit to/from Stand Sit to Stand: Min guard         General transfer comment:  slight steadying assist needed.   Ambulation/Gait Ambulation/Gait assistance: Min assist Ambulation Distance (Feet): 210 Feet Assistive device: None Gait Pattern/deviations:  Step-through pattern;Decreased stride length;Drifts right/left Gait velocity: decreased Gait velocity interpretation: Below normal speed for age/gender General Gait Details: some instability noted, 2 noted balance checks requiring assist to correct   Stairs            Wheelchair Mobility    Modified Rankin (Stroke Patients Only)       Balance     Sitting balance-Leahy Scale: Good     Standing balance support: No upper extremity supported Standing balance-Leahy Scale: Fair Standing balance comment: static standing without use of hands for functional tasks, min guard for dynamic standing                     Cognition Arousal/Alertness: Awake/alert Behavior During Therapy: Flat affect Overall Cognitive Status: Within Functional Limits for tasks assessed                      Exercises General Exercises - Lower Extremity Ankle Circles/Pumps: AROM;Both;10 reps;Seated Long Arc Quad: AROM;Both;10 reps;Seated    General Comments General comments (skin integrity, edema, etc.): patient with fatigue post ambulation, HR elevated 128      Pertinent Vitals/Pain Pain Assessment: 0-10 Pain Score: 3     Home Living Family/patient expects to be discharged to:: Private residence Living Arrangements: Spouse/significant other;Children Available Help at Discharge: Family;Available PRN/intermittently         Home Equipment: None      Prior Function Level of Independence: Independent          PT Goals (current goals can now be found in the care plan section) Acute Rehab PT Goals Patient Stated Goal: to go home PT Goal Formulation: With patient Time For  Goal Achievement: 01/24/14 Potential to Achieve Goals: Good Progress towards PT goals: Progressing toward goals    Frequency  Min 3X/week    PT Plan Current plan remains appropriate    Co-evaluation             End of Session Equipment Utilized During Treatment: Gait belt;Oxygen Activity  Tolerance: Patient tolerated treatment well Patient left: in chair;with call bell/phone within reach     Time: 1702-1726 PT Time Calculation (min): 24 min  Charges:  $Gait Training: 8-22 mins $Therapeutic Activity: 8-22 mins                    G CodesFabio Asa 2014/01/21, 5:35 PM [name], PT DPT  [phone]

## 2014-01-18 NOTE — Care Management Note (Addendum)
    Page 1 of 2   01/25/2014     11:40:16 AM CARE MANAGEMENT NOTE 01/25/2014  Patient:  Jesus Hunter, Jesus Hunter   Account Number:  [id number]  Date Initiated:  01/09/2014  Documentation initiated by:  Avie Arenas  Subjective/Objective Assessment:   post op appy for rupture.  Continued resp failure, pain and fevers.  tx to ICU on 8-30 with ??ARDS - intubated and sepsis.     Action/Plan:   Anticipated DC Date:     Anticipated DC Plan:  HOME W HOME HEALTH SERVICES      DC Planning Services  CM consult      Choice offered to / List presented to:  C-1 Patient        HH arranged  HH-1 RN      [facility] agency  [facility]   Status of service:   Medicare Important Message given?   (If response is "NO", the following Medicare IM given date fields will be Jesus Hunter) Date Medicare IM given:   Medicare IM given by:   Date Additional Medicare IM given:   Additional Medicare IM given by:    Discharge Disposition:    Per UR Regulation:  Reviewed for med. necessity/level of care/duration of stay  If discussed at Long Length of Stay Meetings, dates discussed:   01/10/2014  01/12/2014  01/17/2014  01/19/2014  01/24/2014    Comments:  Contact:  Kbriuh,Pierre Uncle     [phone]  01-25-14 Plan to place PICC line today and discharge home on IV ABX . Spoke to patient and wife at bedside with interpreter .  Confirmed face sheet information.  Wife is comfortable in learning how to administer IV ABX at home .  Only person in home that knows English is 20 year old child.  Patient's wife stated she will arrange for  Uncle Reynolds Bowl ( who speaks English ) to be present for home health RN visits .  Wife undestands [facility] will teach her how to administer IV ABX and she ( wife ) is comfortable with this .  Ronny Flurry RN BSN [phone]  01-19-14 0945 Tomi Bamberger, RN, BSN [phone] CM discussed pt in the [facility] meeting today. Recommendations from Physician Advisor was for pt to have [facility] services  with the [facility] Risk [facility] Program Agencies. CM will continue to monitor for disposition needs.  01-18-14 1458 Tomi Bamberger, RN,BSN [phone] Pt with prolonged ileus. Initiated on Clear Liquids. TNA continues along with IV abx therapy. CM will continue to monitor for disposition needs.   01-16-14 Gerrit Heck, RNBSN [phone] Increased WBC's - continues on NRB at 80%.

## 2014-01-19 ENCOUNTER — Inpatient Hospital Stay (HOSPITAL_COMMUNITY): Payer: BC Managed Care – PPO

## 2014-01-19 LAB — COMPREHENSIVE METABOLIC PANEL
ALBUMIN: 1.8 g/dL — AB (ref 3.5–5.2)
ALT: 126 U/L — AB (ref 0–53)
AST: 74 U/L — AB (ref 0–37)
Alkaline Phosphatase: 150 U/L — ABNORMAL HIGH (ref 39–117)
Anion gap: 12 (ref 5–15)
BILIRUBIN TOTAL: 0.9 mg/dL (ref 0.3–1.2)
BUN: 17 mg/dL (ref 6–23)
CO2: 24 mEq/L (ref 19–32)
CREATININE: 0.65 mg/dL (ref 0.50–1.35)
Calcium: 7.6 mg/dL — ABNORMAL LOW (ref 8.4–10.5)
Chloride: 97 mEq/L (ref 96–112)
GFR calc Af Amer: >90 mL/min (ref >90)
GFR calc non Af Amer: >90 mL/min (ref >90)
Glucose, Bld: 123 mg/dL — ABNORMAL HIGH (ref 70–99)
Potassium: 3.9 mEq/L (ref 3.7–5.3)
Sodium: 133 mEq/L — ABNORMAL LOW (ref 137–147)
Total Protein: 6.7 g/dL (ref 6.0–8.3)

## 2014-01-19 LAB — ANAEROBIC CULTURE
Gram Stain: NONE SEEN
Special Requests: NORMAL

## 2014-01-19 LAB — GLUCOSE, CAPILLARY
GLUCOSE-CAPILLARY: 126 mg/dL — AB (ref 70–99)
Glucose-Capillary: 133 mg/dL — ABNORMAL HIGH (ref 70–99)
Glucose-Capillary: 119 mg/dL — ABNORMAL HIGH (ref 70–99)
Glucose-Capillary: 96 mg/dL (ref 70–99)
Glucose-Capillary: 91 mg/dL (ref 70–99)

## 2014-01-19 LAB — PHOSPHORUS: Phosphorus: 3.3 mg/dL (ref 2.3–4.6)

## 2014-01-19 LAB — MAGNESIUM: Magnesium: 1.8 mg/dL (ref 1.5–2.5)

## 2014-01-19 MED ORDER — METOPROLOL TARTRATE 25 MG PO TABS
25.0000 mg | ORAL_TABLET | Freq: Two times a day (BID) | ORAL | Status: DC
  Administered 2014-01-19 – 2014-01-22 (×8): 25 mg via ORAL
  Filled 2014-01-19 (×11): qty 1

## 2014-01-19 MED ORDER — IOHEXOL 300 MG/ML  SOLN
80.0000 mL | Freq: Once | INTRAMUSCULAR | Status: AC | PRN
  Administered 2014-01-19: 80 mL via INTRAVENOUS

## 2014-01-19 MED ORDER — IOHEXOL 300 MG/ML  SOLN
25.0000 mL | INTRAMUSCULAR | Status: AC
  Administered 2014-01-19 (×2): 25 mL via ORAL

## 2014-01-19 MED ORDER — KETOROLAC TROMETHAMINE 15 MG/ML IJ SOLN
15.0000 mg | Freq: Once | INTRAMUSCULAR | Status: AC
  Administered 2014-01-19: 15 mg via INTRAVENOUS
  Filled 2014-01-19: qty 1

## 2014-01-19 MED ORDER — FENTANYL 50 MCG/HR TD PT72
50.0000 ug | MEDICATED_PATCH | TRANSDERMAL | Status: DC
  Administered 2014-01-21: 50 ug via TRANSDERMAL
  Filled 2014-01-19: qty 1

## 2014-01-19 MED ORDER — METOPROLOL TARTRATE 12.5 MG HALF TABLET: 12.5000 mg | ORAL_TABLET | Freq: Two times a day (BID) | ORAL | Status: DC

## 2014-01-19 MED ORDER — OXYCODONE-ACETAMINOPHEN 5-325 MG PO TABS
1.0000 | ORAL_TABLET | ORAL | Status: DC | PRN
  Administered 2014-01-20 – 2014-01-25 (×11): 2 via ORAL
  Filled 2014-01-19 (×11): qty 2

## 2014-01-19 MED ORDER — POLYETHYLENE GLYCOL 3350 17 G PO PACK
17.0000 g | PACK | Freq: Every day | ORAL | Status: DC
  Administered 2014-01-20 – 2014-01-24 (×4): 17 g via ORAL
  Filled 2014-01-19 (×7): qty 1

## 2014-01-19 NOTE — Progress Notes (Signed)
**Note Jesus-Identified via Obfuscation** Patient ID: Jesus Hunter, male   DOB: 12-27-1970, 43 y.o.   MRN: [id number]     [address] [place], [place] [address]    Phone: [phone] FAX: [phone]     Subjective: Some pain.  Having BMs, no n/v.  No sob.   Objective:  Vital signs:  Filed Vitals:   01/18/14 2230 01/18/14 2330 01/19/14 0417 01/19/14 0700  BP:  171/96 154/78   Pulse:  102    Temp:   100 F (37.8 C) 99.7 F (37.6 C)  TempSrc:   Oral Oral  Resp:  29    Height:      Weight: 157 lb 10.1 oz (71.5 kg)  157 lb 10 oz (71.498 kg)   SpO2:  97%      Last BM Date: 01/18/14  Intake/Output   Yesterday:  09/09 0701 - 09/10 0700 In: 1517 [P.O.:790; I.V.:120; IV Piggyback:500; TPN:1023] Out: 1252.5 [Urine:1225; Drains:27.5] This shift:    I/O last 3 completed shifts: In: 3036 [P.O.:790; I.V.:130; IV Piggyback:1000] Out: 2052.5 [Urine:2000; Drains:52.5]    Physical Exam:  General: Pt awake and alert.  Chest: cta. Pulling 1270ml on IS.  CV: Pulses intact. Regular rhythm. tachy  Abdomen: Soft. nondistended. Mildly tender at incisions only.  JP drains with serous output. No evidence of peritonitis. No incarcerated hernias.  Ext: SCDs BLE. No mjr edema. No cyanosis  Skin: No petechiae / purpura    Problem List:   Active Problems:   Acute perforated appendicitis   Acute respiratory failure   HTN (hypertension)   Sepsis   Severe sepsis(995.92)   ARDS (adult respiratory distress syndrome)    Results:   Labs: Results for orders placed during the hospital encounter of 01/05/14 (from the past 48 hour(s))  GLUCOSE, CAPILLARY     Status: Abnormal   Collection Time    01/17/14 12:21 PM      Result Value Ref Range   Glucose-Capillary 133 (*) 70 - 99 mg/dL  GLUCOSE, CAPILLARY     Status: Abnormal   Collection Time    01/17/14  4:03 PM      Result Value Ref Range   Glucose-Capillary 122 (*) 70 - 99 mg/dL   Comment 1 Notify RN     Comment 2 Documented in Chart    GLUCOSE, CAPILLARY     Status: Abnormal   Collection Time    01/17/14  9:06 PM      Result Value Ref Range   Glucose-Capillary 122 (*) 70 - 99 mg/dL   Comment 1 Documented in Chart     Comment 2 Notify RN    GLUCOSE, CAPILLARY     Status: Abnormal   Collection Time    01/17/14 11:39 PM      Result Value Ref Range   Glucose-Capillary 111 (*) 70 - 99 mg/dL  GLUCOSE, CAPILLARY     Status: Abnormal   Collection Time    01/18/14  4:16 AM      Result Value Ref Range   Glucose-Capillary 127 (*) 70 - 99 mg/dL  CBC WITH DIFFERENTIAL     Status: Abnormal   Collection Time    01/18/14  5:00 AM      Result Value Ref Range   WBC 12.4 (*) 4.0 - 10.5 K/uL   RBC 3.61 (*) 4.22 - 5.81 MIL/uL   Hemoglobin 9.6 (*) 13.0 - 17.0 g/dL   HCT 29.8 (*) 39.0 -  52.0 %   MCV 82.5  78.0 - 100.0 fL   MCH 26.6  26.0 - 34.0 pg   MCHC 32.2  30.0 - 36.0 g/dL   RDW 13.3  11.5 - 15.5 %   Platelets 502 (*) 150 - 400 K/uL   Neutrophils Relative % 74  43 - 77 %   Lymphocytes Relative 16  12 - 46 %   Monocytes Relative 8  3 - 12 %   Eosinophils Relative 1  0 - 5 %   Basophils Relative 1  0 - 1 %   Neutro Abs 9.2 (*) 1.7 - 7.7 K/uL   Lymphs Abs 2.0  0.7 - 4.0 K/uL   Monocytes Absolute 1.0  0.1 - 1.0 K/uL   Eosinophils Absolute 0.1  0.0 - 0.7 K/uL   Basophils Absolute 0.1  0.0 - 0.1 K/uL   RBC Morphology POLYCHROMASIA PRESENT     WBC Morphology ATYPICAL LYMPHOCYTES     Comment: MILD LEFT SHIFT (1-5% METAS, OCC MYELO, OCC BANDS)  BASIC METABOLIC PANEL     Status: Abnormal   Collection Time    01/18/14  5:00 AM      Result Value Ref Range   Sodium 138  137 - 147 mEq/L   Potassium 4.4  3.7 - 5.3 mEq/L   Chloride 102  96 - 112 mEq/L   CO2 25  19 - 32 mEq/L   Glucose, Bld 120 (*) 70 - 99 mg/dL   BUN 19  6 - 23 mg/dL   Creatinine, Ser 0.62  0.50 - 1.35 mg/dL   Calcium 7.6 (*) 8.4 - 10.5 mg/dL   GFR calc non Af Amer >90  >90 mL/min   GFR calc Af Amer >90  >90 mL/min   Comment:  (NOTE)     The eGFR has been calculated using the CKD EPI equation.     This calculation has not been validated in all clinical situations.     eGFR's persistently <90 mL/min signify possible Chronic Kidney     Disease.   Anion gap 11  5 - 15  GLUCOSE, CAPILLARY     Status: Abnormal   Collection Time    01/18/14  8:13 AM      Result Value Ref Range   Glucose-Capillary 132 (*) 70 - 99 mg/dL   Comment 1 Notify RN     Comment 2 Documented in Chart    GLUCOSE, CAPILLARY     Status: Abnormal   Collection Time    01/18/14 11:02 AM      Result Value Ref Range   Glucose-Capillary 119 (*) 70 - 99 mg/dL   Comment 1 Notify RN     Comment 2 Documented in Chart    GLUCOSE, CAPILLARY     Status: Abnormal   Collection Time    01/18/14  3:38 PM      Result Value Ref Range   Glucose-Capillary 135 (*) 70 - 99 mg/dL   Comment 1 Notify RN     Comment 2 Documented in Chart    GLUCOSE, CAPILLARY     Status: Abnormal   Collection Time    01/18/14  7:50 PM      Result Value Ref Range   Glucose-Capillary 118 (*) 70 - 99 mg/dL   Comment 1 Documented in Chart     Comment 2 Notify RN    GLUCOSE, CAPILLARY     Status: Abnormal   Collection Time    01/18/14 11:28 PM  Result Value Ref Range   Glucose-Capillary 118 (*) 70 - 99 mg/dL  GLUCOSE, CAPILLARY     Status: Abnormal   Collection Time    01/19/14  4:22 AM      Result Value Ref Range   Glucose-Capillary 126 (*) 70 - 99 mg/dL  COMPREHENSIVE METABOLIC PANEL     Status: Abnormal   Collection Time    01/19/14  6:00 AM      Result Value Ref Range   Sodium 133 (*) 137 - 147 mEq/L   Potassium 3.9  3.7 - 5.3 mEq/L   Chloride 97  96 - 112 mEq/L   CO2 24  19 - 32 mEq/L   Glucose, Bld 123 (*) 70 - 99 mg/dL   BUN 17  6 - 23 mg/dL   Creatinine, Ser 0.65  0.50 - 1.35 mg/dL   Calcium 7.6 (*) 8.4 - 10.5 mg/dL   Total Protein 6.7  6.0 - 8.3 g/dL   Albumin 1.8 (*) 3.5 - 5.2 g/dL   AST 74 (*) 0 - 37 U/L   ALT 126 (*) 0 - 53 U/L   Alkaline  Phosphatase 150 (*) 39 - 117 U/L   Total Bilirubin 0.9  0.3 - 1.2 mg/dL   GFR calc non Af Amer >90  >90 mL/min   GFR calc Af Amer >90  >90 mL/min   Comment: (NOTE)     The eGFR has been calculated using the CKD EPI equation.     This calculation has not been validated in all clinical situations.     eGFR's persistently <90 mL/min signify possible Chronic Kidney     Disease.   Anion gap 12  5 - 15  MAGNESIUM     Status: None   Collection Time    01/19/14  6:00 AM      Result Value Ref Range   Magnesium 1.8  1.5 - 2.5 mg/dL  PHOSPHORUS     Status: None   Collection Time    01/19/14  6:00 AM      Result Value Ref Range   Phosphorus 3.3  2.3 - 4.6 mg/dL  GLUCOSE, CAPILLARY     Status: Abnormal   Collection Time    01/19/14  7:51 AM      Result Value Ref Range   Glucose-Capillary 133 (*) 70 - 99 mg/dL   Comment 1 Notify RN     Comment 2 Documented in Chart      Imaging / Studies: No results found.  Medications / Allergies:  Scheduled Meds: . antiseptic oral rinse  7 mL Mouth Rinse QID  . chlorhexidine  15 mL Mouth Rinse BID  . enoxaparin (LOVENOX) injection  40 mg Subcutaneous Q24H  . feeding supplement (RESOURCE BREEZE)  1 Container Oral TID BM  . fentaNYL  75 mcg Transdermal Q72H  . insulin aspart  0-9 Units Subcutaneous Q4H  . linezolid  600 mg Intravenous Q12H  . meropenem (MERREM) IV  1 g Intravenous 3 times per day  . micafungin [facility]) IV  100 mg Intravenous Daily   Continuous Infusions: . sodium chloride 10 mL/hr at 01/17/14 0555  . Marland KitchenTPN (CLINIMIX-E) Adult 83 mL/hr at 01/18/14 1721   And  . fat emulsion 250 mL (01/18/14 1721)   PRN Meds:.acetaminophen (TYLENOL) oral liquid 160 mg/5 mL, acetaminophen, acetaminophen, albuterol, hydrALAZINE, HYDROmorphone (DILAUDID) injection, labetalol, metoprolol, ondansetron (ZOFRAN) IV  Antibiotics: Anti-infectives   Start     Dose/Rate Route Frequency Ordered Stop   01/14/14 1000  linezolid (ZYVOX) IVPB 600 mg     600  mg 300 mL/hr over 60 Minutes Intravenous Every 12 hours 01/14/14 0748     01/12/14 1400  vancomycin (VANCOCIN) 1,250 mg in sodium chloride 0.9 % 250 mL IVPB  Status:  Discontinued     1,250 mg 166.7 mL/hr over 90 Minutes Intravenous Every 6 hours 01/12/14 1305 01/14/14 0747   01/11/14 1400  meropenem (MERREM) 1 g in sodium chloride 0.9 % 100 mL IVPB     1 g 200 mL/hr over 30 Minutes Intravenous 3 times per day 01/11/14 1138     01/11/14 1300  vancomycin (VANCOCIN) IVPB 1000 mg/200 mL premix  Status:  Discontinued     1,000 mg 200 mL/hr over 60 Minutes Intravenous Every 8 hours 01/11/14 1233 01/12/14 1305   01/10/14 1500  micafungin (MYCAMINE) 100 mg in sodium chloride 0.9 % 100 mL IVPB     100 mg 100 mL/hr over 1 Hours Intravenous Daily 01/10/14 1405     01/08/14 2200  vancomycin (VANCOCIN) IVPB 1000 mg/200 mL premix  Status:  Discontinued     1,000 mg 200 mL/hr over 60 Minutes Intravenous Every 12 hours 01/08/14 0913 01/10/14 1417   01/08/14 0945  vancomycin (VANCOCIN) 2,000 mg in sodium chloride 0.9 % 500 mL IVPB     2,000 mg 250 mL/hr over 120 Minutes Intravenous  Once 01/08/14 0913 01/08/14 1206   01/05/14 2200  piperacillin-tazobactam (ZOSYN) IVPB 3.375 g  Status:  Discontinued     3.375 g 12.5 mL/hr over 240 Minutes Intravenous 3 times per day 01/05/14 1847 01/11/14 1138   01/05/14 1345  piperacillin-tazobactam (ZOSYN) IVPB 3.375 g     3.375 g 100 mL/hr over 30 Minutes Intravenous  Once 01/05/14 1343 01/05/14 1445      Assessment/Plan  s/p Procedure(s):  APPENDECTOMY LAPAROSCOPIC (N/A)  Gangrenous Ruptured Appendicitis---Dr. Hulen Skains 01/05/14  PCM  Post op ileus-resolving  POD#14  -advance to [place] diet, wean off TPN -SCD/lovenox  -drain care(serous), Ir drain is cloudy, but culture still with no growth to date  -cultures-multiple organisms present from OR  -zosyn 8/27-9/2. Vanc 8/30--9/8  Micafungin 9/2---> Meropenem 9/1---> Zyvox 9/5 -->  -ID consult appreciated, recommend 3  weeks of  Therapy -add percocet, wean off duragesic patch. ? Repeat CT scan VDRF  -resolved, on RA AKI-resolved  HTN-add PO metoprolol, will need Rx at DC and close follow up with primary care Dispo-transfer to floor    Erby Pian, [facility] Pager [phone](7A-4:30P) For consults and floor pages call [phone](7A-4:30P)  01/19/2014  9:24 AM

## 2014-01-19 NOTE — Progress Notes (Signed)
I have seen and examined the patient and agree with the assessment and plans.  Isiah Scheel A. Akira Perusse  MD, FACS  

## 2014-01-19 NOTE — Progress Notes (Signed)
Arrived on 6n03, temp 101.2, PA made aware, oriented to room, CT made aware that pt has finished contrast.

## 2014-01-19 NOTE — Progress Notes (Addendum)
PULMONARY / CRITICAL CARE MEDICINE  Name: Jesus Hunter MRN: [id number] DOB: 1970-10-29    ADMISSION DATE:  01/05/2014 CONSULTATION DATE:  01/08/2014  REFERRING MD :  Catha Gosselin  CHIEF COMPLAINT:  Respiratory distress  INITIAL PRESENTATION: 43 yo presented 8/27 with ruptured appendix, taken to surgery for laparoscopic appendectomy and drainage of intra-abd abscess. On 8/30 developed sepsis and respiratory distress requiring intubation.  STUDIES / EVENTS:  8/27  Abdomen CT >>>  Ruptured appendix  8/30  Sepsis / acute respiratory failure >>> intubated 8/31  Abdomen CT >>> Ileus, mildly distended gallbladder without stones, bibasal atelectasis  9/1    Opioids withdrawal suspected 9/2    Extubated  9/4    Chest / abdomen CT >>> bilateral dense pulmonary infiltrates, several abdominal fluid collections 9/5    Pelvic fluid collection drained by IR which yielded turbid fluid 9/08  Tolerating [place] O2. Trf to SDU ordered  SUBJ:  No new complaints. No distress. Now on RA  VITAL SIGNS: Temp:  [98.1 F (36.7 C)-100 F (37.8 C)] 99.7 F (37.6 C) (09/10 0700) Pulse Rate:  [93-109] 102 (09/09 2330) Resp:  [25-29] 29 (09/09 2330) BP: (148-171)/(78-106) 154/78 mmHg (09/10 0417) SpO2:  [97 %-100 %] 97 % (09/09 2330) Weight:  [71.498 kg (157 lb 10 oz)-71.5 kg (157 lb 10.1 oz)] 71.498 kg (157 lb 10 oz) (09/10 0417)  HEMODYNAMICS:   VENTILATOR SETTINGS:    INTAKE / OUTPUT: Intake/Output     09/09 0701 - 09/10 0700 09/10 0701 - 09/11 0700   P.O. 790    I.V. (mL/kg) 120 (1.7)    Other     IV Piggyback 500    TPN 1023    Total Intake(mL/kg) 2433 (34)    Urine (mL/kg/hr) 1225 (0.7)    Drains 27.5 (0)    Total Output 1252.5     Net +1180.5          Urine Occurrence 1 x    Stool Occurrence 1 x     PHYSICAL EXAMINATION: General: NAD Neuro:  No focal deficits HEENT:  WNL Cardiovascular: Tachy, reg, no M Lungs:  Bilateral crackles, no wheezes Abdomen: less distended, mildly tender  throughout, diminished to absent BS Ext: warm without edema  LABS: I have reviewed all of today's lab results. Relevant abnormalities are discussed in the A/P section  CXR: NSC  ASSESSMENT / PLAN: GASTROINTESTINAL A:   Ruptured appendicitis Peritonitis s/p appendectomy and peritoneal wasshout 8/27 Pelvic drain placed by IR on 9/5 Post op Ileus P:   Taper TPN to off once taking PO adequate CCS primary and managing post op issues  INFECTIOUS A:   Appendicitis Peritonitis Intra-abdominal abscess  Abscess culture 8/27 >>> polymicrobial ID consult 9/06 P:   Meropenem 9/2 >>> Micafungin 9/1 >>> Zyvox 9/5 >>> Abx per ID service, can dc zyvox in my opinion  PULMONARY A:  Acute hypoxemic respiratory failure ARDS P:   Supplemental oxygen to maintain SpO2>92 OK to transfer to floor Recheck CXRto ensure resolution of pulm infitrates  CARDIOVASCULAR A: HTN, controlled Sinus tachycardia P:  Cont Hydralazine PRN for hypertension Add Metoprolol PO low dose & reassess post discharge for continued need  RENAL A:   Mild hyponatremia P:   Monitor BMET intermittently Monitor I/Os Correct electrolytes as indicated  HEMATOLOGIC A:   Mild ICU acquired anemia without acute blood loss P:  DVT px: SQ LMWH Monitor CBC intermittently Transfuse per usual ICU guidelines  ENDOCRINE A: Mild hyperglycemia due toTPN P:   Cont  SSI   NEUROLOGIC A:   Severe post op pain - improved Possible opioid habituation (high tolerance) Anxiety, improved P:   Cont Fentanyl patch Cont Dilaudid PRN DC PRN midaz  I have personally obtained history, examined patient, evaluated and interpreted laboratory and imaging results, reviewed medical records, formulated assessment / plan and placed orders. pCCM to sign off     Oretha Milch., MD Pulmonary and Critical Care Medicine   01/19/2014, 9:30 AM  Addendum - FU CXR shows partial clearing of infx in left lung, FU CXR in 1-2 wks to  document resolution  11:50 AM

## 2014-01-19 NOTE — Progress Notes (Signed)
L CL d/c per order, pt tol well, pt in trend postion, pt to remain in trend postion until 1315.

## 2014-01-19 NOTE — Progress Notes (Signed)
ANTIBIOTIC CONSULT NOTE - FOLLOW UP  Pharmacy Consult for Merrem Indication: Intra-abdominal infection  No Known Allergies  Patient Measurements: Height:  (157.5 cm) Weight: 157 lb 10 oz (71.498 kg) IBW/kg (Calculated) : 54.6 Adjusted Body Weight:   Vital Signs: Temp: 99.7 F (37.6 C) (09/10 0700) Temp src: Oral (09/10 0700) BP: 154/78 mmHg (09/10 0417) Pulse Rate: 102 (09/09 2330) Intake/Output from previous day: 09/09 0701 - 09/10 0700 In: 2433 [P.O.:790; I.V.:120; IV Piggyback:500; TPN:1023] Out: 1252.5 [Urine:1225; Drains:27.5] Intake/Output from this shift:    Labs:  Recent Labs  01/17/14 0400 01/18/14 0500 01/19/14 0600  WBC 12.5* 12.4*  --   HGB 10.0* 9.6*  --   PLT 519* 502*  --   CREATININE 0.63 0.62 0.65   Estimated Creatinine Clearance: 102.3 ml/min (by C-G formula based on Cr of 0.65). No results found for this basename: VANCOTROUGH, VANCOPEAK, VANCORANDOM, GENTTROUGH, GENTPEAK, GENTRANDOM, TOBRATROUGH, TOBRAPEAK, TOBRARND, AMIKACINPEAK, AMIKACINTROU, AMIKACIN,  in the last 72 hours   Microbiology: Recent Results (from the past 720 hour(s))  BODY FLUID CULTURE     Status: None   Collection Time    01/05/14  4:27 PM      Result Value Ref Range Status   Specimen Description PERITONEAL FLUID   Final   Special Requests PATIENT ON FOLLOWING ZOSYN RECEIVED SWAB   Final   Gram Stain     Final   Value: NO WBC SEEN     ABUNDANT GRAM NEGATIVE RODS     ABUNDANT GRAM POSITIVE RODS     MODERATE GRAM POSITIVE COCCI     IN PAIRS Gram Stain Report Called to,Read Back By and Verified With: Gram Stain Report Called to,Read Back By and Verified With: Purcell Nails RN 01/06/14 0740AM BY MILSH   Culture     Final   Value: MULTIPLE ORGANISMS PRESENT, NONE PREDOMINANT     Performed at [facility]   Report Status 01/07/2014 FINAL   Final  ANAEROBIC CULTURE     Status: None   Collection Time    01/05/14  4:27 PM      Result Value Ref Range Status   Specimen  Description PERITONEAL FLUID   Final   Special Requests PATIENT ON FOLLOWING ZOSYN RECEIVED SWAB   Final   Gram Stain     Final   Value: NO WBC SEEN     ABUNDANT GRAM NEGATIVE RODS     ABUNDANT GRAM POSITIVE RODS     MODERATE GRAM POSITIVE COCCI     IN PAIRS   Culture     Final   Value: NO ANAEROBES ISOLATED     Performed at [facility]   Report Status 01/10/2014 FINAL   Final  CULTURE, BLOOD (ROUTINE X 2)     Status: None   Collection Time    01/08/14 12:45 PM      Result Value Ref Range Status   Specimen Description BLOOD LEFT ARM   Final   Special Requests BOTTLES DRAWN AEROBIC ONLY 5CC   Final   Culture  Setup Time     Final   Value: 01/08/2014 18:43     Performed at [facility]   Culture     Final   Value: NO GROWTH 5 DAYS     Performed at [facility]   Report Status 01/14/2014 FINAL   Final  CULTURE, BLOOD (ROUTINE X 2)     Status: None   Collection Time    01/08/14  1:00 PM      Result Value Ref Range Status   Specimen Description BLOOD LEFT HAND   Final   Special Requests BOTTLES DRAWN AEROBIC ONLY 8CC   Final   Culture  Setup Time     Final   Value: 01/08/2014 18:44     Performed at [facility]   Culture     Final   Value: NO GROWTH 5 DAYS     Performed at [facility]   Report Status 01/14/2014 FINAL   Final  MRSA PCR SCREENING     Status: None   Collection Time    01/08/14  5:23 PM      Result Value Ref Range Status   MRSA by PCR NEGATIVE  NEGATIVE Final   Comment:            The GeneXpert MRSA Assay (FDA     approved for NASAL specimens     only), is one component of a     comprehensive MRSA colonization     surveillance program. It is not     intended to diagnose MRSA     infection nor to guide or     monitor treatment for     MRSA infections.  CULTURE, ROUTINE-ABSCESS     Status: None   Collection Time    01/14/14  1:24 PM      Result Value Ref Range Status   Specimen Description PERITONEAL CAVITY    Final   Special Requests Normal   Final   Gram Stain     Final   Value: NO WBC SEEN     NO SQUAMOUS EPITHELIAL CELLS SEEN     NO ORGANISMS SEEN     Performed at [facility]   Culture     Final   Value: NO GROWTH 3 DAYS     Performed at [facility]   Report Status 01/17/2014 FINAL   Final  ANAEROBIC CULTURE     Status: None   Collection Time    01/14/14  1:24 PM      Result Value Ref Range Status   Specimen Description PERITONEAL CAVITY   Final   Special Requests Normal   Final   Gram Stain     Final   Value: NO WBC SEEN     NO SQUAMOUS EPITHELIAL CELLS SEEN     NO ORGANISMS SEEN     Performed at [facility]   Culture     Final   Value: NO ANAEROBES ISOLATED     Performed at [facility]   Report Status 01/19/2014 FINAL   Final  MALARIA SMEAR     Status: None   Collection Time    01/16/14 12:00 PM      Result Value Ref Range Status   Specimen Description BLOOD   Final   Special Requests NONE   Final   Malaria Prep     Final   Value: No Plasmodium or Other Blood Parasites Seen on Thick or Thin Smears For persons strongly suspected of having a blood parasite,but have negative smears, it is recommended that blood films be repeated approximately every 12 to 24 hours for 3 consecutive      days.     Performed at [facility]   Report Status 01/18/2014 FINAL   Final    Anti-infectives   Start     Dose/Rate Route Frequency Ordered Stop   01/14/14 1000  linezolid (ZYVOX)  IVPB 600 mg     600 mg 300 mL/hr over 60 Minutes Intravenous Every 12 hours 01/14/14 0748     01/12/14 1400  vancomycin (VANCOCIN) 1,250 mg in sodium chloride 0.9 % 250 mL IVPB  Status:  Discontinued     1,250 mg 166.7 mL/hr over 90 Minutes Intravenous Every 6 hours 01/12/14 1305 01/14/14 0747   01/11/14 1400  meropenem (MERREM) 1 g in sodium chloride 0.9 % 100 mL IVPB     1 g 200 mL/hr over 30 Minutes Intravenous 3 times per day 01/11/14 1138     01/11/14 1300   vancomycin (VANCOCIN) IVPB 1000 mg/200 mL premix  Status:  Discontinued     1,000 mg 200 mL/hr over 60 Minutes Intravenous Every 8 hours 01/11/14 1233 01/12/14 1305   01/10/14 1500  micafungin (MYCAMINE) 100 mg in sodium chloride 0.9 % 100 mL IVPB     100 mg 100 mL/hr over 1 Hours Intravenous Daily 01/10/14 1405     01/08/14 2200  vancomycin (VANCOCIN) IVPB 1000 mg/200 mL premix  Status:  Discontinued     1,000 mg 200 mL/hr over 60 Minutes Intravenous Every 12 hours 01/08/14 0913 01/10/14 1417   01/08/14 0945  vancomycin (VANCOCIN) 2,000 mg in sodium chloride 0.9 % 500 mL IVPB     2,000 mg 250 mL/hr over 120 Minutes Intravenous  Once 01/08/14 0913 01/08/14 1206   01/05/14 2200  piperacillin-tazobactam (ZOSYN) IVPB 3.375 g  Status:  Discontinued     3.375 g 12.5 mL/hr over 240 Minutes Intravenous 3 times per day 01/05/14 1847 01/11/14 1138   01/05/14 1345  piperacillin-tazobactam (ZOSYN) IVPB 3.375 g     3.375 g 100 mL/hr over 30 Minutes Intravenous  Once 01/05/14 1343 01/05/14 1445      Assessment: 42yom on Merrem (Day 9), Zyvox (Day 6) and Micafungin (Day 10) (total antibiotic Day 14) for peritonitis s/p ruptured appendix. Tmax 100, WBC trending down, no significant growth reported on cultures. ID has been consulted and recommending to complete 3 weeks of antibiotic therapy. - SCr 0.65, CrCl >100 - stable during admission - Hg trending down to 9.6, Plts 500  Plan:  1. Continue Merrem 1g IV q8h 2. Continue Zyvox  IV q12h - monitor CBC closely as Hg trending down 3. Continue Micafungin  IV q24hr -  Monitor LFTs 4. Follow-up cultures, renal function, clinica status, and antibotic plan  Jesus Hunter [phone] 01/19/2014,9:47 AM

## 2014-01-19 NOTE — Progress Notes (Signed)
Subjective: Patient denies any abdominal drain site pain, N/V. He does complain of a non-productive cough today. RN states they are flushes IR drain q shift.  Objective: Physical Exam: BP 169/93  Pulse 91  Temp(Src) 99.7 F (37.6 C) (Oral)  Resp 23  Ht  (1.575 m)  Wt 157 lb 10 oz (71.498 kg)  BMI 28.82 kg/m2  SpO2 97%  General: A&Ox3, NAD Abd: Soft, NT, Right flank drain (IR) 15 cc (10cc) minimal output in bulb bloody/cloudy, Cx no growth, surgical drain intact minimal output serous  Labs: CBC  Recent Labs  01/17/14 0400 01/18/14 0500  WBC 12.5* 12.4*  HGB 10.0* 9.6*  HCT 30.8* 29.8*  PLT 519* 502*   BMET  Recent Labs  01/18/14 0500 01/19/14 0600  NA 138 133*  K 4.4 3.9  CL 102 97  CO2 25 24  GLUCOSE 120* 123*  BUN 19 17  CREATININE 0.62 0.65  CALCIUM 7.6* 7.6*   LFT  Recent Labs  01/19/14 0600  PROT 6.7  ALBUMIN 1.8*  AST 74*  ALT 126*  ALKPHOS 150*  BILITOT 0.9   PT/INR No results found for this basename: LABPROT, INR,  in the last 72 hours   Studies/Results: Dg Chest 2 View  01/19/2014   CLINICAL DATA:  Chest pain; lung infiltrates  EXAM: CHEST  2 VIEW  COMPARISON:  Chest CT January 13, 2014 and chest radiograph January 17, 2014  FINDINGS: There has been partial clearing of consolidation from the left lower lobe. There is also less consolidation in the left upper lobe. Patchy infiltrate in the left mid lung remains. Patchy infiltrate in the right upper lobe in right base regions remains without significant change. There is no new opacity.  Heart size and pulmonary vascularity are normal. No adenopathy. Central catheter tip is in the superior vena cava near the cavoatrial junction. No pneumothorax. No bone lesions.  IMPRESSION: Partial but incomplete clearing of infiltrate from the left lung. Patchy infiltrate remains in the left mid lung. Patchy infiltrate on the right is not significantly changed. No new opacity. No change in cardiac  silhouette.   Electronically Signed   By: Bretta Bang M.D.   On: 01/19/2014 10:53    Assessment/Plan: S/p laparoscopic appendectomy  Post operative fluid collection S/p TG perc drain placed 9/5 with minimal output and wbc stable at 12 (17), low grade fevers, Cx no growth Will d/w Dr. Fredia Sorrow    LOS: 14 days    Pattricia Boss D PA-C 01/19/2014 11:17 AM

## 2014-01-19 NOTE — Progress Notes (Signed)
Pt to TX to 6N, VSS, report called, contrast admin. Family present & aware.

## 2014-01-19 NOTE — Progress Notes (Signed)
Agree.  Can likely pull drain soon after repeat CT.

## 2014-01-20 LAB — CBC
HEMATOCRIT: 31.8 % — AB (ref 39.0–52.0)
HEMOGLOBIN: 10.4 g/dL — AB (ref 13.0–17.0)
MCH: 26.7 pg (ref 26.0–34.0)
MCHC: 32.7 g/dL (ref 30.0–36.0)
MCV: 81.7 fL (ref 78.0–100.0)
Platelets: 543 10*3/uL — ABNORMAL HIGH (ref 150–400)
RBC: 3.89 MIL/uL — ABNORMAL LOW (ref 4.22–5.81)
RDW: 13 % (ref 11.5–15.5)
WBC: 14 10*3/uL — AB (ref 4.0–10.5)

## 2014-01-20 LAB — GLUCOSE, CAPILLARY
GLUCOSE-CAPILLARY: 94 mg/dL (ref 70–99)
GLUCOSE-CAPILLARY: 87 mg/dL (ref 70–99)
Glucose-Capillary: 103 mg/dL — ABNORMAL HIGH (ref 70–99)
Glucose-Capillary: 126 mg/dL — ABNORMAL HIGH (ref 70–99)

## 2014-01-20 NOTE — Progress Notes (Signed)
I have seen and examined the patient and agree with the assessment and plans.  Douglas A. Blackman  MD, FACS  

## 2014-01-20 NOTE — Progress Notes (Signed)
Patient ID: Jesus Hunter, male   DOB: 08/01/1970, 43 y.o.   MRN: [id number]   R TG drain removed at bedside Per Dr Miles Costain and Dr Magnus Ivan  Pt tolerated well

## 2014-01-20 NOTE — Progress Notes (Signed)
Patient ID: Jesus Hunter, male   DOB: June 23, 1970, 43 y.o.   MRN: [id number]    Reason for Consult: Pelvic abscess  Referring Physician(s): Evlyn Courier MD  History of Present Illness:  Jesus Hunter is a 43 y.o. male  R TG pelvic abscess drain placed 9/5 Better today Output minimal Up in chair Complains of RUQ pain   Allergies: Review of patient's allergies indicates no known allergies.  Medications: Prior to Admission medications   Medication Sig Start Date End Date Taking? Authorizing Provider  acetaminophen (TYLENOL) 325 MG tablet Take 325 mg by mouth every 4 (four) hours as needed (pain).   Yes Historical Provider, MD    Review of Systems  Gastrointestinal: Positive for abdominal pain. Negative for nausea and abdominal distention.    Vital Signs: BP 148/95  Pulse 96  Temp(Src) 98.1 F (36.7 C) (Oral)  Resp 17  Ht '5\' 2"'  (1.575 m)  Wt 74.3 kg (163 lb 12.8 oz)  BMI 29.95 kg/m2  SpO2 97%  Physical Exam  Abdominal: Soft. Bowel sounds are normal. He exhibits no distension. There is tenderness.  Rt TG drain intact Output very minimal  Blood tinged/serous Site clean and dry; sl tender     Imaging: Dg Chest 2 View  01/19/2014   CLINICAL DATA:  Chest pain; lung infiltrates  EXAM: CHEST  2 VIEW  COMPARISON:  Chest CT January 13, 2014 and chest radiograph January 17, 2014  FINDINGS: There has been partial clearing of consolidation from the left lower lobe. There is also less consolidation in the left upper lobe. Patchy infiltrate in the left mid lung remains. Patchy infiltrate in the right upper lobe in right base regions remains without significant change. There is no new opacity.  Heart size and pulmonary vascularity are normal. No adenopathy. Central catheter tip is in the superior vena cava near the cavoatrial junction. No pneumothorax. No bone lesions.  IMPRESSION: Partial but incomplete clearing of infiltrate from the left lung. Patchy infiltrate remains in the left mid  lung. Patchy infiltrate on the right is not significantly changed. No new opacity. No change in cardiac silhouette.   Electronically Signed   By: Lowella Grip M.D.   On: 01/19/2014 10:53   Ct Abdomen Pelvis W Contrast  01/19/2014   CLINICAL DATA:  Ruptured appendicitis on 01/05/2014, post laparoscopic appendectomy and drainage of intra-abdominal abscess, postoperative sepsis  EXAM: CT ABDOMEN AND PELVIS WITH CONTRAST  TECHNIQUE: Multidetector CT imaging of the abdomen and pelvis was performed using the standard protocol following bolus administration of intravenous contrast. Sagittal and coronal MPR images reconstructed from axial data set.  CONTRAST:  82m OMNIPAQUE IOHEXOL 300 MG/ML SOLN IV. Dilute oral contrast.  COMPARISON:  01/13/2014, 01/14/2014  FINDINGS: RIGHT lower lobe consolidation.  Bibasilar effusions and atelectasis.  Small splenic cleft.  Liver, spleen, pancreas, kidneys, and adrenal glands otherwise normal.  Minimal perihepatic free fluid, decreased.  Small focal perihepatic collection near the gallbladder fossa, decreased in size, now measuring 3.4 x 2.2 cm image 31.  Small focal fluid collection under LEFT diaphragm question loculated ascites versus subdiaphragmatic abscess 3.5 x 2.0 cm image 14.  Scattered areas of bowel wall thickening involving large and small bowel loops in the RIGHT lower quadrant and pelvis likely related to inflammation.  Two drainage catheters are identified, low central pelvis and RIGHT lower quadrant.  Small residual fluid collection medial to cecum, 3.5 x 2.2 cm, image 54.  No evidence of bowel obstruction.  Small gas and fluid collection  identified between the sigmoid colon and superior margin of the urinary bladder, 5.0 x 1.7 x 1.1 cm, best demonstrated on sagittal images, increased in size.  No additional focal abscess collections identified.  No mass, adenopathy, hernia, or free intraperitoneal air.  Bones unremarkable.  IMPRESSION: No significant residual  fluid at site low central pelvic abscess collection which was drained.  Small amount of residual fluid within mesentery medial to cecum just above drainage catheter.  Decrease in size of LEFT subdiaphragmatic collection.  Decreased perihepatic fluid though a small more focal area of residual fluid is seen adjacent to the gallbladder fossa.  Above fluid collections could be sterile or infected, recommend continued followup.  Small abscess collection above urinary bladder containing gas and fluid, 5.0 x 1.7 x 1.1 cm.   Electronically Signed   By: Lavonia Dana M.D.   On: 01/19/2014 16:28   Dg Chest Port 1 View  01/17/2014   CLINICAL DATA:  Followup respiratory failure.  EXAM: PORTABLE CHEST - 1 VIEW  COMPARISON:  01/16/2014.  FINDINGS: There are patchy areas of confluence, as well as irregular interstitial opacities in both lungs, most prominent in the left upper lobe. Lung volumes are low. Lung opacities are similar to the previous day's study allowing for lower lung volumes and the different patient positioning on the current exam.  No pneumothorax.  Left internal jugular central venous line and nasogastric tube are stable and well positioned.  IMPRESSION: 1. No significant change from the previous day's study. 2. Persistent bilateral areas of lung confluent opacity and interstitial densities most likely due to multifocal pneumonia.   Electronically Signed   By: Lajean Manes M.D.   On: 01/17/2014 07:42   Dg Abd Portable 1v  01/17/2014   CLINICAL DATA:  NG tube placement, ileus.  EXAM: PORTABLE ABDOMEN - 1 VIEW  COMPARISON:  01/13/2014  FINDINGS: NG tube tip is difficult to visualize but appears to be within the fundus of the stomach, partially obscured due to motion. Gas within a few scattered prominent small bowel loops and throughout the colon, not significantly changed since prior study. No free air.  IMPRESSION: NG tube tip is obscured by motion but appears to be in the gastric fundus. No change in bowel gas  pattern   Electronically Signed   By: Rolm Baptise M.D.   On: 01/17/2014 09:06    Labs: Results for orders placed during the hospital encounter of 01/05/14 (from the past 48 hour(s))  GLUCOSE, CAPILLARY     Status: Abnormal   Collection Time    01/18/14  3:38 PM      Result Value Ref Range   Glucose-Capillary 135 (*) 70 - 99 mg/dL   Comment 1 Notify RN     Comment 2 Documented in Chart    GLUCOSE, CAPILLARY     Status: Abnormal   Collection Time    01/18/14  7:50 PM      Result Value Ref Range   Glucose-Capillary 118 (*) 70 - 99 mg/dL   Comment 1 Documented in Chart     Comment 2 Notify RN    GLUCOSE, CAPILLARY     Status: Abnormal   Collection Time    01/18/14 11:28 PM      Result Value Ref Range   Glucose-Capillary 118 (*) 70 - 99 mg/dL  GLUCOSE, CAPILLARY     Status: Abnormal   Collection Time    01/19/14  4:22 AM      Result Value Ref Range  Glucose-Capillary 126 (*) 70 - 99 mg/dL  COMPREHENSIVE METABOLIC PANEL     Status: Abnormal   Collection Time    01/19/14  6:00 AM      Result Value Ref Range   Sodium 133 (*) 137 - 147 mEq/L   Potassium 3.9  3.7 - 5.3 mEq/L   Chloride 97  96 - 112 mEq/L   CO2 24  19 - 32 mEq/L   Glucose, Bld 123 (*) 70 - 99 mg/dL   BUN 17  6 - 23 mg/dL   Creatinine, Ser 0.65  0.50 - 1.35 mg/dL   Calcium 7.6 (*) 8.4 - 10.5 mg/dL   Total Protein 6.7  6.0 - 8.3 g/dL   Albumin 1.8 (*) 3.5 - 5.2 g/dL   AST 74 (*) 0 - 37 U/L   ALT 126 (*) 0 - 53 U/L   Alkaline Phosphatase 150 (*) 39 - 117 U/L   Total Bilirubin 0.9  0.3 - 1.2 mg/dL   GFR calc non Af Amer >90  >90 mL/min   GFR calc Af Amer >90  >90 mL/min   Comment: (NOTE)     The eGFR has been calculated using the CKD EPI equation.     This calculation has not been validated in all clinical situations.     eGFR's persistently <90 mL/min signify possible Chronic Kidney     Disease.   Anion gap 12  5 - 15  MAGNESIUM     Status: None   Collection Time    01/19/14  6:00 AM      Result Value  Ref Range   Magnesium 1.8  1.5 - 2.5 mg/dL  PHOSPHORUS     Status: None   Collection Time    01/19/14  6:00 AM      Result Value Ref Range   Phosphorus 3.3  2.3 - 4.6 mg/dL  GLUCOSE, CAPILLARY     Status: Abnormal   Collection Time    01/19/14  7:51 AM      Result Value Ref Range   Glucose-Capillary 133 (*) 70 - 99 mg/dL   Comment 1 Notify RN     Comment 2 Documented in Chart    GLUCOSE, CAPILLARY     Status: Abnormal   Collection Time    01/19/14 11:31 AM      Result Value Ref Range   Glucose-Capillary 119 (*) 70 - 99 mg/dL   Comment 1 Notify RN     Comment 2 Documented in Chart    GLUCOSE, CAPILLARY     Status: None   Collection Time    01/19/14  4:53 PM      Result Value Ref Range   Glucose-Capillary 96  70 - 99 mg/dL  GLUCOSE, CAPILLARY     Status: None   Collection Time    01/19/14  7:50 PM      Result Value Ref Range   Glucose-Capillary 91  70 - 99 mg/dL  GLUCOSE, CAPILLARY     Status: Abnormal   Collection Time    01/20/14 12:02 AM      Result Value Ref Range   Glucose-Capillary 126 (*) 70 - 99 mg/dL   Comment 1 Notify RN     Comment 2 Documented in Chart    GLUCOSE, CAPILLARY     Status: None   Collection Time    01/20/14  4:05 AM      Result Value Ref Range   Glucose-Capillary 94  70 - 99 mg/dL  Comment 1 Notify RN     Comment 2 Documented in Chart    CBC     Status: Abnormal   Collection Time    01/20/14  5:00 AM      Result Value Ref Range   WBC 14.0 (*) 4.0 - 10.5 K/uL   RBC 3.89 (*) 4.22 - 5.81 MIL/uL   Hemoglobin 10.4 (*) 13.0 - 17.0 g/dL   HCT 31.8 (*) 39.0 - 52.0 %   MCV 81.7  78.0 - 100.0 fL   MCH 26.7  26.0 - 34.0 pg   MCHC 32.7  30.0 - 36.0 g/dL   RDW 13.0  11.5 - 15.5 %   Platelets 543 (*) 150 - 400 K/uL  GLUCOSE, CAPILLARY     Status: None   Collection Time    01/20/14  8:11 AM      Result Value Ref Range   Glucose-Capillary 87  70 - 99 mg/dL    Assessment and Plan: Rt TG drain in place CT reveals resolved collection with this  drain New small collection noted near bladder---too small for asp/drain per [employer] per [facility]   Olin Hauser A Dameisha Tschida PAC- IR   I spent a total of 15 minutes face to face in clinical consultation/evaluation, greater than 50% of which was counseling/coordinating care for Rt pelvic abscess drain

## 2014-01-20 NOTE — Progress Notes (Signed)
Patient ID: Jesus Hunter, male   DOB: 09-Sep-1970, 43 y.o.   MRN: [id number]     [address] [place], [place] [address]    Phone: [phone] FAX: [phone]     Subjective: Febrile.  WBC up from 12.4 to 14K.  Having BMs.  Tolerating diet.  Voiding.  Drains with minimal output.    Objective:  Vital signs:  Filed Vitals:   01/19/14 1333 01/19/14 2150 01/20/14 0206 01/20/14 0520  BP: 162/98 164/88 161/101 148/95  Pulse: 100 94 99 96  Temp: 101.2 F (38.4 C) 99.1 F (37.3 C) 99.9 F (37.7 C) 98.1 F (36.7 C)  TempSrc: Oral Oral  Oral  Resp: '18 16 16 17  ' Height: '5\' 2"'  (1.575 m)     Weight: 163 lb 12.8 oz (74.3 kg)     SpO2: 98% 99% 98% 97%    Last BM Date: 01/19/14  Intake/Output   Yesterday:  09/10 0701 - 09/11 0700 In: 2396 [P.O.:1190; I.V.:906; IV Piggyback:300] Out: 2632 [Urine:2625; Drains:7] This shift:  Total I/O In: 638 [P.O.:638] Out: 300 [Urine:300]  Physical Exam:  General: Pt awake and alert.  Chest: cta. CV: Pulses intact. Regular rhythm. tachy  Abdomen: Soft. nondistended. Mildly tender at incisions only. JP drains with serous output. No evidence of peritonitis. No incarcerated hernias.  Ext: SCDs BLE. No mjr edema. No cyanosis  Skin: No petechiae / purpura   Problem List:   Active Problems:   Acute perforated appendicitis   Acute respiratory failure   HTN (hypertension)   Sepsis   Severe sepsis(995.92)   ARDS (adult respiratory distress syndrome)    Results:   Labs: Results for orders placed during the hospital encounter of 01/05/14 (from the past 48 hour(s))  GLUCOSE, CAPILLARY     Status: Abnormal   Collection Time    01/18/14 11:02 AM      Result Value Ref Range   Glucose-Capillary 119 (*) 70 - 99 mg/dL   Comment 1 Notify RN     Comment 2 Documented in Chart    GLUCOSE, CAPILLARY     Status: Abnormal   Collection Time    01/18/14  3:38 PM      Result Value Ref  Range   Glucose-Capillary 135 (*) 70 - 99 mg/dL   Comment 1 Notify RN     Comment 2 Documented in Chart    GLUCOSE, CAPILLARY     Status: Abnormal   Collection Time    01/18/14  7:50 PM      Result Value Ref Range   Glucose-Capillary 118 (*) 70 - 99 mg/dL   Comment 1 Documented in Chart     Comment 2 Notify RN    GLUCOSE, CAPILLARY     Status: Abnormal   Collection Time    01/18/14 11:28 PM      Result Value Ref Range   Glucose-Capillary 118 (*) 70 - 99 mg/dL  GLUCOSE, CAPILLARY     Status: Abnormal   Collection Time    01/19/14  4:22 AM      Result Value Ref Range   Glucose-Capillary 126 (*) 70 - 99 mg/dL  COMPREHENSIVE METABOLIC PANEL     Status: Abnormal   Collection Time    01/19/14  6:00 AM      Result Value Ref Range   Sodium 133 (*) 137 - 147 mEq/L   Potassium 3.9  3.7 - 5.3 mEq/L  Chloride 97  96 - 112 mEq/L   CO2 24  19 - 32 mEq/L   Glucose, Bld 123 (*) 70 - 99 mg/dL   BUN 17  6 - 23 mg/dL   Creatinine, Ser 0.65  0.50 - 1.35 mg/dL   Calcium 7.6 (*) 8.4 - 10.5 mg/dL   Total Protein 6.7  6.0 - 8.3 g/dL   Albumin 1.8 (*) 3.5 - 5.2 g/dL   AST 74 (*) 0 - 37 U/L   ALT 126 (*) 0 - 53 U/L   Alkaline Phosphatase 150 (*) 39 - 117 U/L   Total Bilirubin 0.9  0.3 - 1.2 mg/dL   GFR calc non Af Amer >90  >90 mL/min   GFR calc Af Amer >90  >90 mL/min   Comment: (NOTE)     The eGFR has been calculated using the CKD EPI equation.     This calculation has not been validated in all clinical situations.     eGFR's persistently <90 mL/min signify possible Chronic Kidney     Disease.   Anion gap 12  5 - 15  MAGNESIUM     Status: None   Collection Time    01/19/14  6:00 AM      Result Value Ref Range   Magnesium 1.8  1.5 - 2.5 mg/dL  PHOSPHORUS     Status: None   Collection Time    01/19/14  6:00 AM      Result Value Ref Range   Phosphorus 3.3  2.3 - 4.6 mg/dL  GLUCOSE, CAPILLARY     Status: Abnormal   Collection Time    01/19/14  7:51 AM      Result Value Ref Range    Glucose-Capillary 133 (*) 70 - 99 mg/dL   Comment 1 Notify RN     Comment 2 Documented in Chart    GLUCOSE, CAPILLARY     Status: Abnormal   Collection Time    01/19/14 11:31 AM      Result Value Ref Range   Glucose-Capillary 119 (*) 70 - 99 mg/dL   Comment 1 Notify RN     Comment 2 Documented in Chart    GLUCOSE, CAPILLARY     Status: None   Collection Time    01/19/14  4:53 PM      Result Value Ref Range   Glucose-Capillary 96  70 - 99 mg/dL  GLUCOSE, CAPILLARY     Status: None   Collection Time    01/19/14  7:50 PM      Result Value Ref Range   Glucose-Capillary 91  70 - 99 mg/dL  GLUCOSE, CAPILLARY     Status: Abnormal   Collection Time    01/20/14 12:02 AM      Result Value Ref Range   Glucose-Capillary 126 (*) 70 - 99 mg/dL   Comment 1 Notify RN     Comment 2 Documented in Chart    GLUCOSE, CAPILLARY     Status: None   Collection Time    01/20/14  4:05 AM      Result Value Ref Range   Glucose-Capillary 94  70 - 99 mg/dL   Comment 1 Notify RN     Comment 2 Documented in Chart    CBC     Status: Abnormal   Collection Time    01/20/14  5:00 AM      Result Value Ref Range   WBC 14.0 (*) 4.0 - 10.5 K/uL   RBC 3.89 (*)  4.22 - 5.81 MIL/uL   Hemoglobin 10.4 (*) 13.0 - 17.0 g/dL   HCT 31.8 (*) 39.0 - 52.0 %   MCV 81.7  78.0 - 100.0 fL   MCH 26.7  26.0 - 34.0 pg   MCHC 32.7  30.0 - 36.0 g/dL   RDW 13.0  11.5 - 15.5 %   Platelets 543 (*) 150 - 400 K/uL  GLUCOSE, CAPILLARY     Status: None   Collection Time    01/20/14  8:11 AM      Result Value Ref Range   Glucose-Capillary 87  70 - 99 mg/dL    Imaging / Studies: Dg Chest 2 View  01/19/2014   CLINICAL DATA:  Chest pain; lung infiltrates  EXAM: CHEST  2 VIEW  COMPARISON:  Chest CT January 13, 2014 and chest radiograph January 17, 2014  FINDINGS: There has been partial clearing of consolidation from the left lower lobe. There is also less consolidation in the left upper lobe. Patchy infiltrate in the left mid lung  remains. Patchy infiltrate in the right upper lobe in right base regions remains without significant change. There is no new opacity.  Heart size and pulmonary vascularity are normal. No adenopathy. Central catheter tip is in the superior vena cava near the cavoatrial junction. No pneumothorax. No bone lesions.  IMPRESSION: Partial but incomplete clearing of infiltrate from the left lung. Patchy infiltrate remains in the left mid lung. Patchy infiltrate on the right is not significantly changed. No new opacity. No change in cardiac silhouette.   Electronically Signed   By: Lowella Grip M.D.   On: 01/19/2014 10:53   Ct Abdomen Pelvis W Contrast  01/19/2014   CLINICAL DATA:  Ruptured appendicitis on 01/05/2014, post laparoscopic appendectomy and drainage of intra-abdominal abscess, postoperative sepsis  EXAM: CT ABDOMEN AND PELVIS WITH CONTRAST  TECHNIQUE: Multidetector CT imaging of the abdomen and pelvis was performed using the standard protocol following bolus administration of intravenous contrast. Sagittal and coronal MPR images reconstructed from axial data set.  CONTRAST:  20m OMNIPAQUE IOHEXOL 300 MG/ML SOLN IV. Dilute oral contrast.  COMPARISON:  01/13/2014, 01/14/2014  FINDINGS: RIGHT lower lobe consolidation.  Bibasilar effusions and atelectasis.  Small splenic cleft.  Liver, spleen, pancreas, kidneys, and adrenal glands otherwise normal.  Minimal perihepatic free fluid, decreased.  Small focal perihepatic collection near the gallbladder fossa, decreased in size, now measuring 3.4 x 2.2 cm image 31.  Small focal fluid collection under LEFT diaphragm question loculated ascites versus subdiaphragmatic abscess 3.5 x 2.0 cm image 14.  Scattered areas of bowel wall thickening involving large and small bowel loops in the RIGHT lower quadrant and pelvis likely related to inflammation.  Two drainage catheters are identified, low central pelvis and RIGHT lower quadrant.  Small residual fluid collection  medial to cecum, 3.5 x 2.2 cm, image 54.  No evidence of bowel obstruction.  Small gas and fluid collection identified between the sigmoid colon and superior margin of the urinary bladder, 5.0 x 1.7 x 1.1 cm, best demonstrated on sagittal images, increased in size.  No additional focal abscess collections identified.  No mass, adenopathy, hernia, or free intraperitoneal air.  Bones unremarkable.  IMPRESSION: No significant residual fluid at site low central pelvic abscess collection which was drained.  Small amount of residual fluid within mesentery medial to cecum just above drainage catheter.  Decrease in size of LEFT subdiaphragmatic collection.  Decreased perihepatic fluid though a small more focal area of residual fluid is seen  adjacent to the gallbladder fossa.  Above fluid collections could be sterile or infected, recommend continued followup.  Small abscess collection above urinary bladder containing gas and fluid, 5.0 x 1.7 x 1.1 cm.   Electronically Signed   By: Lavonia Dana M.D.   On: 01/19/2014 16:28    Medications / Allergies:  Scheduled Meds: . enoxaparin (LOVENOX) injection  40 mg Subcutaneous Q24H  . feeding supplement (RESOURCE BREEZE)  1 Container Oral TID BM  . [START ON 01/21/2014] fentaNYL  50 mcg Transdermal Q72H  . insulin aspart  0-9 Units Subcutaneous Q4H  . linezolid  600 mg Intravenous Q12H  . meropenem (MERREM) IV  1 g Intravenous 3 times per day  . metoprolol tartrate  25 mg Oral BID  . micafungin [facility]) IV  100 mg Intravenous Daily  . polyethylene glycol  17 g Oral Daily   Continuous Infusions: . sodium chloride 10 mL/hr at 01/19/14 1300   PRN Meds:.acetaminophen (TYLENOL) oral liquid 160 mg/5 mL, acetaminophen, acetaminophen, albuterol, hydrALAZINE, HYDROmorphone (DILAUDID) injection, labetalol, ondansetron (ZOFRAN) IV, oxyCODONE-acetaminophen  Antibiotics: Anti-infectives   Start     Dose/Rate Route Frequency Ordered Stop   01/14/14 1000  linezolid (ZYVOX)  IVPB 600 mg     600 mg 300 mL/hr over 60 Minutes Intravenous Every 12 hours 01/14/14 0748     01/12/14 1400  vancomycin (VANCOCIN) 1,250 mg in sodium chloride 0.9 % 250 mL IVPB  Status:  Discontinued     1,250 mg 166.7 mL/hr over 90 Minutes Intravenous Every 6 hours 01/12/14 1305 01/14/14 0747   01/11/14 1400  meropenem (MERREM) 1 g in sodium chloride 0.9 % 100 mL IVPB     1 g 200 mL/hr over 30 Minutes Intravenous 3 times per day 01/11/14 1138     01/11/14 1300  vancomycin (VANCOCIN) IVPB 1000 mg/200 mL premix  Status:  Discontinued     1,000 mg 200 mL/hr over 60 Minutes Intravenous Every 8 hours 01/11/14 1233 01/12/14 1305   01/10/14 1500  micafungin (MYCAMINE) 100 mg in sodium chloride 0.9 % 100 mL IVPB     100 mg 100 mL/hr over 1 Hours Intravenous Daily 01/10/14 1405     01/08/14 2200  vancomycin (VANCOCIN) IVPB 1000 mg/200 mL premix  Status:  Discontinued     1,000 mg 200 mL/hr over 60 Minutes Intravenous Every 12 hours 01/08/14 0913 01/10/14 1417   01/08/14 0945  vancomycin (VANCOCIN) 2,000 mg in sodium chloride 0.9 % 500 mL IVPB     2,000 mg 250 mL/hr over 120 Minutes Intravenous  Once 01/08/14 0913 01/08/14 1206   01/05/14 2200  piperacillin-tazobactam (ZOSYN) IVPB 3.375 g  Status:  Discontinued     3.375 g 12.5 mL/hr over 240 Minutes Intravenous 3 times per day 01/05/14 1847 01/11/14 1138   01/05/14 1345  piperacillin-tazobactam (ZOSYN) IVPB 3.375 g     3.375 g 100 mL/hr over 30 Minutes Intravenous  Once 01/05/14 1343 01/05/14 1445      Assessment/Plan  Gangrenous Ruptured Appendicitis S/p laparoscopic appendectomy---Dr. Grandville Silos 01/05/14  PCM  Post op ileus-resolving  POD#15  -NPO until IR reviews the CT Scan, then regular diet -SCD/lovenox  -drain care(serous), Ir drain is cloudy, but culture still with no growth to date  -cultures-multiple organisms present from OR  -zosyn 8/27-9/2. Vanc 8/30--9/8 Micafungin 9/2---> Meropenem 9/1---> Zyvox 9/5 -->  -ID consult  appreciated, recommend 3 weeks of Therapy  -percocet, wean off duragesic patch.  -repeat shows no significant residual pelvic abscess, decreased left  subdiaphragmatic collection, perihepatic fluid. 5x1.7x1.1cm abscess above the urinary bladder, will ask IR if able to drain VDRF  -resolved, on RA  AKI-resolved  HTN-c/w metoprolol, will need Rx at DC and close follow up with primary care  Dispo-continue inpatient.   Erby Pian, [facility] Pager [phone]) For consults and floor pages call [phone](7A-4:30P)  01/20/2014 9:27 AM

## 2014-01-20 NOTE — Progress Notes (Signed)
Patient ID: Jesus Hunter, male   DOB: May 13, 1970, 43 y.o.   MRN: [id number]   CT reviewed by Dr Miles Costain  Collection near bladder too small to asp/drain Called to [facility] PA

## 2014-01-20 NOTE — Progress Notes (Signed)
Physical Therapy Treatment Patient Details Name: Vi Whitesel MRN: [id number] DOB: Apr 26, 1971 Today's Date: 29-Jan-2014    History of Present Illness      PT Comments    Pt doing much better overall today; c/o only of "a little bit" of abd pain, sats 100% on RA with activity, HR 105 after amb; Would encourage pt to amb with nsg and/or family; no further PT needs at this time;   Follow Up Recommendations  No PT follow up (vs HHPT safety eval)     Equipment Recommendations  None recommended by PT    Recommendations for Other Services       Precautions / Restrictions      Mobility  Bed Mobility               General bed mobility comments: pt on EOB with wife finishing bath, brushing teeth  Transfers Overall transfer level: Modified independent Equipment used: None Transfers: Sit to/from Stand Sit to Stand: Modified independent (Device/Increase time)         General transfer comment: slighlty incr time d/t abd discomfort  Ambulation/Gait Ambulation/Gait assistance: Supervision Ambulation Distance (Feet): 300 Feet Assistive device: None Gait Pattern/deviations: Step-through pattern;Drifts right/left     General Gait Details: no LOB balance today, more steady, mild DOE with amb, slight incr pain with amb but "ok" per pt; Sats 100% and HR 105   Stairs            Wheelchair Mobility    Modified Rankin (Stroke Patients Only)       Balance   Sitting-balance support: Feet supported;No upper extremity supported Sitting balance-Leahy Scale: Good     Standing balance support: No upper extremity supported;During functional activity Standing balance-Leahy Scale: Good                      Cognition Arousal/Alertness: Awake/alert Behavior During Therapy: WFL for tasks assessed/performed Overall Cognitive Status: Within Functional Limits for tasks assessed                      Exercises      General Comments        Pertinent  Vitals/Pain Pain Assessment: Faces Faces Pain Scale: Hurts a little bit Pain Location: abdomen Pain Intervention(s): Monitored during session;Repositioned;Limited activity within patient's tolerance    Home Living                      Prior Function            PT Goals (current goals can now be found in the care plan section) Acute Rehab PT Goals Patient Stated Goal: to go home PT Goal Formulation: With patient Time For Goal Achievement: 01/24/14 Potential to Achieve Goals: Good Progress towards PT goals: Goals met and updated - see care plan    Frequency       PT Plan      Co-evaluation             End of Session   Activity Tolerance: Patient tolerated treatment well Patient left: in chair;with call bell/phone within reach;with family/visitor present     Time: 3559-7416 PT Time Calculation (min): 17 min  Charges:  $Gait Training: 8-22 mins                    G Codes:      Addis Bennie 2014-01-29, 8:44 AM

## 2014-01-21 LAB — CBC WITH DIFFERENTIAL/PLATELET
BASOS PCT: 1 % (ref 0–1)
Basophils Absolute: 0.1 10*3/uL (ref 0.0–0.1)
EOS ABS: 0.1 10*3/uL (ref 0.0–0.7)
Eosinophils Relative: 1 % (ref 0–5)
HCT: 32.4 % — ABNORMAL LOW (ref 39.0–52.0)
HEMOGLOBIN: 10.3 g/dL — AB (ref 13.0–17.0)
Lymphocytes Relative: 20 % (ref 12–46)
Lymphs Abs: 2.2 10*3/uL (ref 0.7–4.0)
MCH: 27 pg (ref 26.0–34.0)
MCHC: 31.8 g/dL (ref 30.0–36.0)
MCV: 84.8 fL (ref 78.0–100.0)
MONOS PCT: 7 % (ref 3–12)
Monocytes Absolute: 0.8 10*3/uL (ref 0.1–1.0)
NEUTROS PCT: 71 % (ref 43–77)
Neutro Abs: 7.5 10*3/uL (ref 1.7–7.7)
PLATELETS: 494 10*3/uL — AB (ref 150–400)
RBC: 3.82 MIL/uL — ABNORMAL LOW (ref 4.22–5.81)
RDW: 13.3 % (ref 11.5–15.5)
WBC: 10.6 10*3/uL — ABNORMAL HIGH (ref 4.0–10.5)

## 2014-01-21 NOTE — Progress Notes (Signed)
16 Days Post-Op  Subjective: Complains of some abdominal soreness. Otherwise ok  Objective: Vital signs in last 24 hours: Temp:  [98.2 F (36.8 C)-100.9 F (38.3 C)] 98.4 F (36.9 C) (09/12 0830) Pulse Rate:  [87-103] 96 (09/12 0830) Resp:  [15-17] 16 (09/12 0830) BP: (139-161)/(87-98) 161/87 mmHg (09/12 0830) SpO2:  [98 %-100 %] 99 % (09/12 0830) Weight:  [156 lb 15.5 oz (71.2 kg)] 156 lb 15.5 oz (71.2 kg) (09/12 0507) Last BM Date: 01/19/14  Intake/Output from previous day: 09/11 0701 - 09/12 0700 In: 2203 [P.O.:1520; I.V.:673] Out: 705 [Urine:700; Drains:5] Intake/Output this shift: Total I/O In: 240 [P.O.:240] Out: 375 [Urine:375]  Resp: clear to auscultation bilaterally Cardio: regular rate and rhythm GI: soft, good bs  Lab Results:   Recent Labs  01/20/14 0500 01/21/14 0744  WBC 14.0* 10.6*  HGB 10.4* 10.3*  HCT 31.8* 32.4*  PLT 543* 494*   BMET  Recent Labs  01/19/14 0600  NA 133*  K 3.9  CL 97  CO2 24  GLUCOSE 123*  BUN 17  CREATININE 0.65  CALCIUM 7.6*   PT/INR No results found for this basename: LABPROT, INR,  in the last 72 hours ABG No results found for this basename: PHART, PCO2, PO2, HCO3,  in the last 72 hours  Studies/Results: Dg Chest 2 View  01/19/2014   CLINICAL DATA:  Chest pain; lung infiltrates  EXAM: CHEST  2 VIEW  COMPARISON:  Chest CT January 13, 2014 and chest radiograph January 17, 2014  FINDINGS: There has been partial clearing of consolidation from the left lower lobe. There is also less consolidation in the left upper lobe. Patchy infiltrate in the left mid lung remains. Patchy infiltrate in the right upper lobe in right base regions remains without significant change. There is no new opacity.  Heart size and pulmonary vascularity are normal. No adenopathy. Central catheter tip is in the superior vena cava near the cavoatrial junction. No pneumothorax. No bone lesions.  IMPRESSION: Partial but incomplete clearing of  infiltrate from the left lung. Patchy infiltrate remains in the left mid lung. Patchy infiltrate on the right is not significantly changed. No new opacity. No change in cardiac silhouette.   Electronically Signed   By: Bretta Bang M.D.   On: 01/19/2014 10:53   Ct Abdomen Pelvis W Contrast  01/19/2014   CLINICAL DATA:  Ruptured appendicitis on 01/05/2014, post laparoscopic appendectomy and drainage of intra-abdominal abscess, postoperative sepsis  EXAM: CT ABDOMEN AND PELVIS WITH CONTRAST  TECHNIQUE: Multidetector CT imaging of the abdomen and pelvis was performed using the standard protocol following bolus administration of intravenous contrast. Sagittal and coronal MPR images reconstructed from axial data set.  CONTRAST:  80mL OMNIPAQUE IOHEXOL 300 MG/ML SOLN IV. Dilute oral contrast.  COMPARISON:  01/13/2014, 01/14/2014  FINDINGS: RIGHT lower lobe consolidation.  Bibasilar effusions and atelectasis.  Small splenic cleft.  Liver, spleen, pancreas, kidneys, and adrenal glands otherwise normal.  Minimal perihepatic free fluid, decreased.  Small focal perihepatic collection near the gallbladder fossa, decreased in size, now measuring 3.4 x 2.2 cm image 31.  Small focal fluid collection under LEFT diaphragm question loculated ascites versus subdiaphragmatic abscess 3.5 x 2.0 cm image 14.  Scattered areas of bowel wall thickening involving large and small bowel loops in the RIGHT lower quadrant and pelvis likely related to inflammation.  Two drainage catheters are identified, low central pelvis and RIGHT lower quadrant.  Small residual fluid collection medial to cecum, 3.5 x 2.2 cm, image  54.  No evidence of bowel obstruction.  Small gas and fluid collection identified between the sigmoid colon and superior margin of the urinary bladder, 5.0 x 1.7 x 1.1 cm, best demonstrated on sagittal images, increased in size.  No additional focal abscess collections identified.  No mass, adenopathy, hernia, or free  intraperitoneal air.  Bones unremarkable.  IMPRESSION: No significant residual fluid at site low central pelvic abscess collection which was drained.  Small amount of residual fluid within mesentery medial to cecum just above drainage catheter.  Decrease in size of LEFT subdiaphragmatic collection.  Decreased perihepatic fluid though a small more focal area of residual fluid is seen adjacent to the gallbladder fossa.  Above fluid collections could be sterile or infected, recommend continued followup.  Small abscess collection above urinary bladder containing gas and fluid, 5.0 x 1.7 x 1.1 cm.   Electronically Signed   By: Ulyses Southward M.D.   On: 01/19/2014 16:28    Anti-infectives: Anti-infectives   Start     Dose/Rate Route Frequency Ordered Stop   01/14/14 1000  linezolid (ZYVOX) IVPB 600 mg     600 mg 300 mL/hr over 60 Minutes Intravenous Every 12 hours 01/14/14 0748     01/12/14 1400  vancomycin (VANCOCIN) 1,250 mg in sodium chloride 0.9 % 250 mL IVPB  Status:  Discontinued     1,250 mg 166.7 mL/hr over 90 Minutes Intravenous Every 6 hours 01/12/14 1305 01/14/14 0747   01/11/14 1400  meropenem (MERREM) 1 g in sodium chloride 0.9 % 100 mL IVPB     1 g 200 mL/hr over 30 Minutes Intravenous 3 times per day 01/11/14 1138     01/11/14 1300  vancomycin (VANCOCIN) IVPB 1000 mg/200 mL premix  Status:  Discontinued     1,000 mg 200 mL/hr over 60 Minutes Intravenous Every 8 hours 01/11/14 1233 01/12/14 1305   01/10/14 1500  micafungin (MYCAMINE) 100 mg in sodium chloride 0.9 % 100 mL IVPB     100 mg 100 mL/hr over 1 Hours Intravenous Daily 01/10/14 1405     01/08/14 2200  vancomycin (VANCOCIN) IVPB 1000 mg/200 mL premix  Status:  Discontinued     1,000 mg 200 mL/hr over 60 Minutes Intravenous Every 12 hours 01/08/14 0913 01/10/14 1417   01/08/14 0945  vancomycin (VANCOCIN) 2,000 mg in sodium chloride 0.9 % 500 mL IVPB     2,000 mg 250 mL/hr over 120 Minutes Intravenous  Once 01/08/14 0913 01/08/14  1206   01/05/14 2200  piperacillin-tazobactam (ZOSYN) IVPB 3.375 g  Status:  Discontinued     3.375 g 12.5 mL/hr over 240 Minutes Intravenous 3 times per day 01/05/14 1847 01/11/14 1138   01/05/14 1345  piperacillin-tazobactam (ZOSYN) IVPB 3.375 g     3.375 g 100 mL/hr over 30 Minutes Intravenous  Once 01/05/14 1343 01/05/14 1445      Assessment/Plan: s/p Procedure(s): APPENDECTOMY LAPAROSCOPIC (N/A) Advance diet Continue abx. Wbc improving  LOS: 16 days    TOTH III,Seriah Brotzman S 01/21/2014

## 2014-01-22 NOTE — Progress Notes (Signed)
17 Days Post-Op  Subjective: No complaints. Getting a bath this am  Objective: Vital signs in last 24 hours: Temp:  [98 F (36.7 C)-99.2 F (37.3 C)] 99.2 F (37.3 C) (09/13 0559) Pulse Rate:  [97-98] 98 (09/13 0559) Resp:  [16-18] 18 (09/13 0559) BP: (145-155)/(98-101) 145/99 mmHg (09/13 0559) SpO2:  [98 %-100 %] 98 % (09/13 0559) Weight:  [153 lb 10.6 oz (69.7 kg)] 153 lb 10.6 oz (69.7 kg) (09/13 0559) Last BM Date: 01/21/14  Intake/Output from previous day: 09/12 0701 - 09/13 0700 In: 700 [P.O.:700] Out: 2275 [Urine:2275] Intake/Output this shift:    Resp: clear to auscultation bilaterally Cardio: regular rate and rhythm GI: soft, nontender. good bs  Lab Results:   Recent Labs  01/20/14 0500 01/21/14 0744  WBC 14.0* 10.6*  HGB 10.4* 10.3*  HCT 31.8* 32.4*  PLT 543* 494*   BMET No results found for this basename: NA, K, CL, CO2, GLUCOSE, BUN, CREATININE, CALCIUM,  in the last 72 hours PT/INR No results found for this basename: LABPROT, INR,  in the last 72 hours ABG No results found for this basename: PHART, PCO2, PO2, HCO3,  in the last 72 hours  Studies/Results: No results found.  Anti-infectives: Anti-infectives   Start     Dose/Rate Route Frequency Ordered Stop   01/14/14 1000  linezolid (ZYVOX) IVPB 600 mg     600 mg 300 mL/hr over 60 Minutes Intravenous Every 12 hours 01/14/14 0748     01/12/14 1400  vancomycin (VANCOCIN) 1,250 mg in sodium chloride 0.9 % 250 mL IVPB  Status:  Discontinued     1,250 mg 166.7 mL/hr over 90 Minutes Intravenous Every 6 hours 01/12/14 1305 01/14/14 0747   01/11/14 1400  meropenem (MERREM) 1 g in sodium chloride 0.9 % 100 mL IVPB     1 g 200 mL/hr over 30 Minutes Intravenous 3 times per day 01/11/14 1138     01/11/14 1300  vancomycin (VANCOCIN) IVPB 1000 mg/200 mL premix  Status:  Discontinued     1,000 mg 200 mL/hr over 60 Minutes Intravenous Every 8 hours 01/11/14 1233 01/12/14 1305   01/10/14 1500  micafungin  (MYCAMINE) 100 mg in sodium chloride 0.9 % 100 mL IVPB     100 mg 100 mL/hr over 1 Hours Intravenous Daily 01/10/14 1405     01/08/14 2200  vancomycin (VANCOCIN) IVPB 1000 mg/200 mL premix  Status:  Discontinued     1,000 mg 200 mL/hr over 60 Minutes Intravenous Every 12 hours 01/08/14 0913 01/10/14 1417   01/08/14 0945  vancomycin (VANCOCIN) 2,000 mg in sodium chloride 0.9 % 500 mL IVPB     2,000 mg 250 mL/hr over 120 Minutes Intravenous  Once 01/08/14 0913 01/08/14 1206   01/05/14 2200  piperacillin-tazobactam (ZOSYN) IVPB 3.375 g  Status:  Discontinued     3.375 g 12.5 mL/hr over 240 Minutes Intravenous 3 times per day 01/05/14 1847 01/11/14 1138   01/05/14 1345  piperacillin-tazobactam (ZOSYN) IVPB 3.375 g     3.375 g 100 mL/hr over 30 Minutes Intravenous  Once 01/05/14 1343 01/05/14 1445      Assessment/Plan: s/p Procedure(s): APPENDECTOMY LAPAROSCOPIC (N/A) Advance diet Continue abx ambulate  LOS: 17 days    TOTH III,PAUL S 01/22/2014

## 2014-01-23 ENCOUNTER — Inpatient Hospital Stay (HOSPITAL_COMMUNITY): Payer: BC Managed Care – PPO

## 2014-01-23 LAB — CBC WITH DIFFERENTIAL/PLATELET
BASOS PCT: 1 % (ref 0–1)
Basophils Absolute: 0.1 10*3/uL (ref 0.0–0.1)
EOS ABS: 0.1 10*3/uL (ref 0.0–0.7)
Eosinophils Relative: 1 % (ref 0–5)
HCT: 31.9 % — ABNORMAL LOW (ref 39.0–52.0)
Hemoglobin: 10.3 g/dL — ABNORMAL LOW (ref 13.0–17.0)
LYMPHS ABS: 1.6 10*3/uL (ref 0.7–4.0)
Lymphocytes Relative: 14 % (ref 12–46)
MCH: 26.8 pg (ref 26.0–34.0)
MCHC: 32.3 g/dL (ref 30.0–36.0)
MCV: 83.1 fL (ref 78.0–100.0)
Monocytes Absolute: 0.8 10*3/uL (ref 0.1–1.0)
Monocytes Relative: 7 % (ref 3–12)
NEUTROS PCT: 77 % (ref 43–77)
Neutro Abs: 8.4 10*3/uL — ABNORMAL HIGH (ref 1.7–7.7)
Platelets: 494 10*3/uL — ABNORMAL HIGH (ref 150–400)
RBC: 3.84 MIL/uL — AB (ref 4.22–5.81)
RDW: 13.2 % (ref 11.5–15.5)
WBC: 10.8 10*3/uL — ABNORMAL HIGH (ref 4.0–10.5)

## 2014-01-23 MED ORDER — METOPROLOL TARTRATE 50 MG PO TABS
50.0000 mg | ORAL_TABLET | Freq: Two times a day (BID) | ORAL | Status: DC
  Administered 2014-01-23 – 2014-01-25 (×5): 50 mg via ORAL
  Filled 2014-01-23 (×5): qty 1

## 2014-01-23 NOTE — Progress Notes (Signed)
Patient ID: Jesus Hunter, male   DOB: Nov 05, 1970, 43 y.o.   MRN: [id number]     [facility]      [address] [place]   [place], [place] [address]    Phone: [phone] FAX: [phone]     Subjective: No pain.  Tolerating diet.  Having BMs.  Asking when he's going home.  Objective:  Vital signs:  Filed Vitals:   01/22/14 0559 01/22/14 1402 01/22/14 2137 01/23/14 0531  BP: 145/99 148/96 155/96 165/103  Pulse: 98 85 114 95  Temp: 99.2 F (37.3 C) 97.9 F (36.6 C) 101.3 F (38.5 C) 99.2 F (37.3 C)  TempSrc: Oral Oral Oral Oral  Resp: Height:      Weight: 153 lb 10.6 oz (69.7 kg)   152 lb 12.5 oz (69.3 kg)  SpO2: 98% 99% 99% 100%    Last BM Date: 01/21/14  Intake/Output   Yesterday:  09/13 0701 - 09/14 0700 In: 940 [P.O.:940] Out: 1810 [Urine:1800; Drains:10] This shift: I/O last 3 completed shifts: In: 1160 [P.O.:1160] Out: 3710 [Urine:3700; Drains:10]    Physical Exam:  General: Pt awake and alert.  Chest: cta.  CV: Pulses intact. Regular rhythm. tachy  Abdomen: Soft. nondistended. Minimal tenderness over right rlq drain.. JP drains with serous output. No evidence of peritonitis. No incarcerated hernias.  Ext: SCDs BLE. No mjr edema. No cyanosis  Skin: No petechiae / purpura    Problem List:   Active Problems:   Acute perforated appendicitis   Acute respiratory failure   HTN (hypertension)   Sepsis   Severe sepsis(995.92)   ARDS (adult respiratory distress syndrome)    Results:   Labs: No results found for this or any previous visit (from the past 48 hour(s)).  Imaging / Studies: No results found.  Medications / Allergies:  Scheduled Meds: . enoxaparin (LOVENOX) injection  40 mg Subcutaneous Q24H  . feeding supplement (RESOURCE BREEZE)  1 Container Oral TID BM  . linezolid  600 mg Intravenous Q12H  . meropenem (MERREM) IV  1 g Intravenous 3 times per day  . metoprolol tartrate  25 mg Oral  BID  . micafungin [facility]) IV  100 mg Intravenous Daily  . polyethylene glycol  17 g Oral Daily   Continuous Infusions: . sodium chloride 10 mL/hr at 01/21/14 2313   PRN Meds:.acetaminophen (TYLENOL) oral liquid 160 mg/5 mL, acetaminophen, acetaminophen, albuterol, hydrALAZINE, labetalol, ondansetron (ZOFRAN) IV, oxyCODONE-acetaminophen  Antibiotics: Anti-infectives   Start     Dose/Rate Route Frequency Ordered Stop   01/14/14 1000  linezolid (ZYVOX) IVPB 600 mg     600 mg 300 mL/hr over 60 Minutes Intravenous Every 12 hours 01/14/14 0748     01/12/14 1400  vancomycin (VANCOCIN) 1,250 mg in sodium chloride 0.9 % 250 mL IVPB  Status:  Discontinued     1,250 mg 166.7 mL/hr over 90 Minutes Intravenous Every 6 hours 01/12/14 1305 01/14/14 0747   01/11/14 1400  meropenem (MERREM) 1 g in sodium chloride 0.9 % 100 mL IVPB     1 g 200 mL/hr over 30 Minutes Intravenous 3 times per day 01/11/14 1138     01/11/14 1300  vancomycin (VANCOCIN) IVPB 1000 mg/200 mL premix  Status:  Discontinued     1,000 mg 200 mL/hr over 60 Minutes Intravenous Every 8 hours 01/11/14 1233 01/12/14 1305   01/10/14 1500  micafungin (MYCAMINE) 100 mg in sodium chloride 0.9 % 100 mL IVPB  100 mg 100 mL/hr over 1 Hours Intravenous Daily 01/10/14 1405     01/08/14 2200  vancomycin (VANCOCIN) IVPB 1000 mg/200 mL premix  Status:  Discontinued     1,000 mg 200 mL/hr over 60 Minutes Intravenous Every 12 hours 01/08/14 0913 01/10/14 1417   01/08/14 0945  vancomycin (VANCOCIN) 2,000 mg in sodium chloride 0.9 % 500 mL IVPB     2,000 mg 250 mL/hr over 120 Minutes Intravenous  Once 01/08/14 0913 01/08/14 1206   01/05/14 2200  piperacillin-tazobactam (ZOSYN) IVPB 3.375 g  Status:  Discontinued     3.375 g 12.5 mL/hr over 240 Minutes Intravenous 3 times per day 01/05/14 1847 01/11/14 1138   01/05/14 1345  piperacillin-tazobactam (ZOSYN) IVPB 3.375 g     3.375 g 100 mL/hr over 30 Minutes Intravenous  Once 01/05/14 1343  01/05/14 1445       Assessment/Plan  Gangrenous Ruptured Appendicitis  S/p laparoscopic appendectomy---Dr. Janee Morn 01/05/14  PCM  Post op ileus-resolving  POD#18  -regular diet, having BMs, pain controlled with POs -SCD/lovenox  -drain care(serous), no growth to date(16ml/24h) ? When to remove -cultures-multiple organisms present from OR  -zosyn 8/27-9/2. Vanc 8/30--9/8 Micafungin 9/2---> Meropenem 9/1---> Zyvox 9/5 -->  -ID consult appreciated, recommend 3 weeks of Therapy  -repeat CT on 9/10 shows no significant residual pelvic abscess, decreased left subdiaphragmatic collection, perihepatic fluid.  5x1.7x1.1cm abscess above the urinary bladder, IR unable to drain. -fever overnight, check cbc and cxr -plan to repeat CT of abdomen later this week VDRF/PNA -resolved, on RA  AKI-resolved  HTN-increase metoprolol, will need Rx at DC and close follow up with primary care  Dispo-continue inpatient    Ashok Norris, [facility] Surgery Pager [phone](7A-4:30P) For consults and floor pages call [phone](7A-4:30P)  01/23/2014 9:05 AM

## 2014-01-23 NOTE — Progress Notes (Signed)
Still with fevers wbc mildly elevated looks well. Continue abx and may repeat ct due to abscesses next 48 hours. Hopefully home later this week on abx

## 2014-01-24 LAB — URINALYSIS, ROUTINE W REFLEX MICROSCOPIC
Bilirubin Urine: NEGATIVE
Glucose, UA: NEGATIVE mg/dL
Hgb urine dipstick: NEGATIVE
Ketones, ur: NEGATIVE mg/dL
LEUKOCYTES UA: NEGATIVE
Nitrite: NEGATIVE
PROTEIN: NEGATIVE mg/dL
Specific Gravity, Urine: 1.011 (ref 1.005–1.030)
UROBILINOGEN UA: 0.2 mg/dL (ref 0.0–1.0)
pH: 6 (ref 5.0–8.0)

## 2014-01-24 MED ORDER — MEROPENEM 1 G IV SOLR
1.0000 g | Freq: Three times a day (TID) | INTRAVENOUS | Status: DC
  Administered 2014-01-24 – 2014-01-25 (×4): 1 g via INTRAVENOUS
  Filled 2014-01-24 (×6): qty 1

## 2014-01-24 NOTE — Progress Notes (Signed)
NUTRITION FOLLOW UP  Intervention:   Continue Resource Breeze po TID, each supplement provides 250 kcal and 9 grams of protein RD to follow for nutrition care plan  Nutrition Dx:   Inadequate oral intake, resolved  Goal:   Pt to meet >/= 90% of their estimated nutrition needs, met  Monitor:   PO & supplemental intake, weight, labs, I/O's  Assessment:   43 year old [place] male who presented with 4 days of RLQ abdominal pain. CT of abdomen and pelvis showed ruptured appendicitis and possible abscess. S/P lap appendectomy with drainage of intra-abdominal abscess.   R TG drain removed at bedside per Radiology 9/11.  Patient advanced to Full Liquids 9/10, Regular diet 9/13.  TPN discontinued 9/10.  Reports a good appetite.  PO intake 75-100% per flowsheet records.  Drinking his Resource Breeze supplements.  + BM's.  Probable D/C tomorrow or Thursday.  Height: Ht Readings from Last 1 Encounters:  01/19/14 _0  (1.575 m)    Weight Status: -----> fluctuating  Wt Readings from Last 1 Encounters:  01/23/14 152 lb 12.5 oz (69.3 kg)    9/13  153 lb 9/12  156 lb 9/10  163 lb 9/10  157 lb 9/09  157 lb 9/08  167 lb 9/07  169 lb 9/06  171 lb 9/05  171 lb 9/04  168 lb  Re-estimated needs:  Kcal: 2000-2100  Protein: 100-115 gm  Fluid: >/= 2 L  Skin: surgical incision to abdomen  Diet Order: General   Intake/Output Summary (Last 24 hours) at 01/24/14 1134 Last data filed at 01/24/14 1100  Gross per 24 hour  Intake    900 ml  Output   1320 ml  Net   -420 ml   Labs:   Recent Labs Lab 01/18/14 0500 01/19/14 0600  NA 138 133*  K 4.4 3.9  CL 102 97  CO2 25 24  BUN 19 17  CREATININE 0.62 0.65  CALCIUM 7.6* 7.6*  MG  --  1.8  PHOS  --  3.3  GLUCOSE 120* 123*    Scheduled Meds: . enoxaparin (LOVENOX) injection  40 mg Subcutaneous Q24H  . feeding supplement (RESOURCE BREEZE)  1 Container Oral TID BM  . linezolid  600 mg Intravenous Q12H  . meropenem  (MERREM) IV  1 g Intravenous 3 times per day  . metoprolol tartrate  50 mg Oral BID  . micafungin [facility]) IV  100 mg Intravenous Daily  . polyethylene glycol  17 g Oral Daily    Continuous Infusions: . sodium chloride 10 mL/hr at 01/21/14 2313    Arthur Holms, RD, LDN Pager #: [phone] After-Hours Pager #: [phone]

## 2014-01-24 NOTE — Progress Notes (Signed)
Agree with above, plan ct tomorrow to evaluate drains, possibly home tomorrow or thursday

## 2014-01-24 NOTE — Progress Notes (Signed)
Patient ID: Jesus Hunter, male   DOB: [birth year], 43 y.o.   MRN: [id number]     [facility]      [address] [place]   [place], [place] [address]    Phone: [phone] FAX: [phone]     Subjective: Afebrile.  BP is better with increase in lopressor.  Having BMs.  Little pain.    Objective:  Vital signs:  Filed Vitals:   01/23/14 0531 01/23/14 1403 01/23/14 2228 01/24/14 0553  BP: 165/103 145/110 164/98 153/98  Pulse: 95 83 84 98  Temp: 99.2 F (37.3 C) 98.3 F (36.8 C) 99.6 F (37.6 C) 98.4 F (36.9 C)  TempSrc: Oral Oral Oral Oral  Resp: Height:      Weight: 152 lb 12.5 oz (69.3 kg)     SpO2: 100% 100% 98% 96%    Last BM Date: 01/21/14  Intake/Output   Yesterday:  09/14 0701 - 09/15 0700 In: 660 [P.O.:360; IV Piggyback:300] Out: 1660 [Urine:400; Emesis/NG output:300; Stool:960] This shift:    I/O last 3 completed shifts: In: 880 [P.O.:580; IV Piggyback:300] Out: 3470 [Urine:2200; Emesis/NG output:300; Drains:10; Stool:960]    Physical Exam:  General: Pt awake and alert.  Chest: cta.  CV: Pulses intact. Regular rhythm. tachy  Abdomen: Soft. nondistended. Minimal tenderness over right rlq drain.. JP drains with serous output. No evidence of peritonitis. No incarcerated hernias.  Ext: SCDs BLE. No mjr edema. No cyanosis  Skin: No petechiae / purpura   Problem List:   Active Problems:   Acute perforated appendicitis   Acute respiratory failure   HTN (hypertension)   Sepsis   Severe sepsis(995.92)   ARDS (adult respiratory distress syndrome)    Results:   Labs: Results for orders placed during the hospital encounter of 01/05/14 (from the past 48 hour(s))  CBC WITH DIFFERENTIAL     Status: Abnormal   Collection Time    01/23/14 10:26 AM      Result Value Ref Range   WBC 10.8 (*) 4.0 - 10.5 K/uL   RBC 3.84 (*) 4.22 - 5.81 MIL/uL   Hemoglobin 10.3 (*) 13.0 - 17.0 g/dL   HCT 95.2 (*) 84.1 - 32.4  %   MCV 83.1  78.0 - 100.0 fL   MCH 26.8  26.0 - 34.0 pg   MCHC 32.3  30.0 - 36.0 g/dL   RDW 40.1  02.7 - 25.3 %   Platelets 494 (*) 150 - 400 K/uL   Neutrophils Relative % 77  43 - 77 %   Neutro Abs 8.4 (*) 1.7 - 7.7 K/uL   Lymphocytes Relative 14  12 - 46 %   Lymphs Abs 1.6  0.7 - 4.0 K/uL   Monocytes Relative 7  3 - 12 %   Monocytes Absolute 0.8  0.1 - 1.0 K/uL   Eosinophils Relative 1  0 - 5 %   Eosinophils Absolute 0.1  0.0 - 0.7 K/uL   Basophils Relative 1  0 - 1 %   Basophils Absolute 0.1  0.0 - 0.1 K/uL  URINALYSIS, ROUTINE W REFLEX MICROSCOPIC     Status: None   Collection Time    01/24/14  5:47 AM      Result Value Ref Range   Color, Urine YELLOW  YELLOW   APPearance CLEAR  CLEAR   Specific Gravity, Urine 1.011  1.005 - 1.030   pH 6.0  5.0 - 8.0   Glucose, UA NEGATIVE  NEGATIVE  mg/dL   Hgb urine dipstick NEGATIVE  NEGATIVE   Bilirubin Urine NEGATIVE  NEGATIVE   Ketones, ur NEGATIVE  NEGATIVE mg/dL   Protein, ur NEGATIVE  NEGATIVE mg/dL   Urobilinogen, UA 0.2  0.0 - 1.0 mg/dL   Nitrite NEGATIVE  NEGATIVE   Leukocytes, UA NEGATIVE  NEGATIVE   Comment: MICROSCOPIC NOT DONE ON URINES WITH NEGATIVE PROTEIN, BLOOD, LEUKOCYTES, NITRITE, OR GLUCOSE <1000 mg/dL.    Imaging / Studies: Dg Chest 2 View  01/23/2014   CLINICAL DATA:  Fevers  EXAM: CHEST  2 VIEW  COMPARISON:  01/19/2014  FINDINGS: The cardiac shadow is stable. The previously seen left jugular line is been removed in the interval. Patchy infiltrative changes are again identified bilaterally and stable. Blunting of left posterior costophrenic angle is noted consistent with new effusion.  IMPRESSION: Patchy infiltrates bilaterally stable from the prior exam.  New left-sided effusion.   Electronically Signed   By: Alcide Clever M.D.   On: 01/23/2014 10:29    Medications / Allergies:  Scheduled Meds: . enoxaparin (LOVENOX) injection  40 mg Subcutaneous Q24H  . feeding supplement (RESOURCE BREEZE)  1 Container Oral TID  BM  . linezolid  600 mg Intravenous Q12H  . meropenem (MERREM) IV  1 g Intravenous 3 times per day  . metoprolol tartrate  50 mg Oral BID  . micafungin [facility]) IV  100 mg Intravenous Daily  . polyethylene glycol  17 g Oral Daily   Continuous Infusions: . sodium chloride 10 mL/hr at 01/21/14 2313   PRN Meds:.acetaminophen (TYLENOL) oral liquid 160 mg/5 mL, acetaminophen, acetaminophen, albuterol, hydrALAZINE, ondansetron (ZOFRAN) IV, oxyCODONE-acetaminophen  Antibiotics: Anti-infectives   Start     Dose/Rate Route Frequency Ordered Stop   01/14/14 1000  linezolid (ZYVOX) IVPB 600 mg     600 mg 300 mL/hr over 60 Minutes Intravenous Every 12 hours 01/14/14 0748     01/12/14 1400  vancomycin (VANCOCIN) 1,250 mg in sodium chloride 0.9 % 250 mL IVPB  Status:  Discontinued     1,250 mg 166.7 mL/hr over 90 Minutes Intravenous Every 6 hours 01/12/14 1305 01/14/14 0747   01/11/14 1400  meropenem (MERREM) 1 g in sodium chloride 0.9 % 100 mL IVPB     1 g 200 mL/hr over 30 Minutes Intravenous 3 times per day 01/11/14 1138     01/11/14 1300  vancomycin (VANCOCIN) IVPB 1000 mg/200 mL premix  Status:  Discontinued     1,000 mg 200 mL/hr over 60 Minutes Intravenous Every 8 hours 01/11/14 1233 01/12/14 1305   01/10/14 1500  micafungin (MYCAMINE) 100 mg in sodium chloride 0.9 % 100 mL IVPB     100 mg 100 mL/hr over 1 Hours Intravenous Daily 01/10/14 1405     01/08/14 2200  vancomycin (VANCOCIN) IVPB 1000 mg/200 mL premix  Status:  Discontinued     1,000 mg 200 mL/hr over 60 Minutes Intravenous Every 12 hours 01/08/14 0913 01/10/14 1417   01/08/14 0945  vancomycin (VANCOCIN) 2,000 mg in sodium chloride 0.9 % 500 mL IVPB     2,000 mg 250 mL/hr over 120 Minutes Intravenous  Once 01/08/14 0913 01/08/14 1206   01/05/14 2200  piperacillin-tazobactam (ZOSYN) IVPB 3.375 g  Status:  Discontinued     3.375 g 12.5 mL/hr over 240 Minutes Intravenous 3 times per day 01/05/14 1847 01/11/14 1138   01/05/14  1345  piperacillin-tazobactam (ZOSYN) IVPB 3.375 g     3.375 g 100 mL/hr over 30 Minutes Intravenous  Once  01/05/14 1343 01/05/14 1445     Assessment/Plan  Gangrenous Ruptured Appendicitis  S/p laparoscopic appendectomy---Dr. Janee Morn 01/05/14  PCM  Post op ileus-resolving  POD#19  -regular diet, having BMs, pain controlled with POs, afebrile x24hrs -SCD/lovenox  -drain care(serous), no growth to date(33ml/24h) will await CT and hopefully remove -cultures-multiple organisms present from OR  -zosyn 8/27-9/2. Vanc 8/30--9/8 Micafungin 9/2---> Meropenem 9/1---> Zyvox 9/5 -->  -ID consult appreciated, recommend 3 weeks of Therapy  -repeat CT on 9/10 shows no significant residual pelvic abscess, decreased left subdiaphragmatic collection, perihepatic fluid.  5x1.7x1.1cm abscess above the urinary bladder, IR unable to drain.  -repeat CT in AM to evaluate VDRF/PNA  -resolved, on RA  AKI-resolved  HTN-c/w metoprolol, will need Rx at DC and close follow up with primary care  Dispo-continue inpatient.  DC pending CT, tomorrow or Thursday     Ashok Norris, [facility] Surgery Pager [phone]) For consults and floor pages call [phone](7A-4:30P)  01/24/2014 9:05 AM

## 2014-01-25 ENCOUNTER — Inpatient Hospital Stay (HOSPITAL_COMMUNITY): Payer: BC Managed Care – PPO

## 2014-01-25 LAB — BASIC METABOLIC PANEL
Anion gap: 17 — ABNORMAL HIGH (ref 5–15)
BUN: 11 mg/dL (ref 6–23)
CHLORIDE: 104 meq/L (ref 96–112)
CO2: 21 mEq/L (ref 19–32)
Calcium: 8.2 mg/dL — ABNORMAL LOW (ref 8.4–10.5)
Creatinine, Ser: 0.69 mg/dL (ref 0.50–1.35)
Glucose, Bld: 86 mg/dL (ref 70–99)
POTASSIUM: 3.9 meq/L (ref 3.7–5.3)
Sodium: 142 mEq/L (ref 137–147)

## 2014-01-25 LAB — CBC
HCT: 31.5 % — ABNORMAL LOW (ref 39.0–52.0)
Hemoglobin: 10.1 g/dL — ABNORMAL LOW (ref 13.0–17.0)
MCH: 26.4 pg (ref 26.0–34.0)
MCHC: 32.1 g/dL (ref 30.0–36.0)
MCV: 82.2 fL (ref 78.0–100.0)
PLATELETS: 469 10*3/uL — AB (ref 150–400)
RBC: 3.83 MIL/uL — ABNORMAL LOW (ref 4.22–5.81)
RDW: 13.2 % (ref 11.5–15.5)
WBC: 8.7 10*3/uL (ref 4.0–10.5)

## 2014-01-25 MED ORDER — SODIUM CHLORIDE 0.9 % IV SOLN: 100.0000 mg | Freq: Every day | INTRAVENOUS | Status: DC

## 2014-01-25 MED ORDER — IOHEXOL 300 MG/ML  SOLN
25.0000 mL | INTRAMUSCULAR | Status: AC
  Administered 2014-01-25 (×2): 25 mL via ORAL

## 2014-01-25 MED ORDER — POLYETHYLENE GLYCOL 3350 17 G PO PACK: 17.0000 g | PACK | Freq: Every day | ORAL | Status: AC

## 2014-01-25 MED ORDER — SODIUM CHLORIDE 0.9 % IV SOLN: 1.0000 g | INTRAVENOUS | Status: AC

## 2014-01-25 MED ORDER — IOHEXOL 300 MG/ML  SOLN
100.0000 mL | Freq: Once | INTRAMUSCULAR | Status: AC | PRN
  Administered 2014-01-25: 100 mL via INTRAVENOUS

## 2014-01-25 MED ORDER — CASPOFUNGIN ACETATE 70 MG IV SOLR: 70.0000 mg | Freq: Once | INTRAVENOUS | Status: AC

## 2014-01-25 MED ORDER — LINEZOLID 2 MG/ML IV SOLN
600.0000 mg | Freq: Two times a day (BID) | INTRAVENOUS | Status: AC
Start: 2014-01-14 — End: 2014-02-04

## 2014-01-25 MED ORDER — CASPOFUNGIN ACETATE 50 MG IV SOLR: 50.0000 mg | Freq: Every day | INTRAVENOUS | Status: AC

## 2014-01-25 MED ORDER — SODIUM CHLORIDE 0.9 % IV SOLN: 1.0000 g | Freq: Three times a day (TID) | INTRAVENOUS | Status: DC

## 2014-01-25 MED ORDER — OXYCODONE-ACETAMINOPHEN 5-325 MG PO TABS: 1.0000 | ORAL_TABLET | ORAL | Status: AC | PRN

## 2014-01-25 MED ORDER — SODIUM CHLORIDE 0.9 % IJ SOLN: 10.0000 mL | INTRAMUSCULAR | Status: DC | PRN

## 2014-01-25 MED ORDER — METOPROLOL TARTRATE 50 MG PO TABS: 50.0000 mg | ORAL_TABLET | Freq: Two times a day (BID) | ORAL | Status: AC

## 2014-01-25 NOTE — Discharge Instructions (Signed)

## 2014-01-25 NOTE — Discharge Summary (Signed)
Physician Discharge Summary  Jesus Hunter AVW:[id number] DOB: [birth year] DOA: 01/05/2014  PCP: No primary provider on file.  Consultation: ID--Dr. [name]   CCM   Internal medicine  Admit date: 01/05/2014 Discharge date: 01/25/2014  Recommendations for Outpatient Follow-up:   Follow-up Information   Follow up with [name], MD.   Specialty:  Infectious Diseases   Contact information:   [address] [place] [address] [phone]       Follow up with [name], MD.   Specialty:  General Surgery   Contact information:   [address] [place] [address] [phone]      Discharge Diagnoses:  1. Gangrenous ruptured appendicitis 2. sepsis 3. Protein calorie malnutrition 4. Post operative ileus 5. VDRF 6. Pneumonia 7. AKI 8. hypertension    Surgical Procedure: laparoscopic appendectomy and drainage of intra-abdominal abscess---Dr. Janee Morn 01/05/14  Discharge Condition: stable Disposition: home with [facility]  Diet recommendation: regular  Filed Weights   01/23/14 0531 01/24/14 1820 01/25/14 0515  Weight: 152 lb 12.5 oz (69.3 kg) 152 lb 8.9 oz (69.2 kg) 152 lb 12.5 oz (69.3 kg)     Filed Vitals:   01/25/14 0515  BP: 144/95  Pulse: 90  Temp: 98.4 F (36.9 C)  Resp: 16     Hospital Course:  Jesus Hunter is a 43 year old [name] who presents with 4 days of RLQ abdominal pain.  He has a normal white count, tachycardic in the 120s. Ct of abdomen and pelvis with ruptured appendicitis and possible abscess. The patient was admitted and underwent a laparoscopic appendectomy and drainage of an abscess.  He tolerated the procedure well and was transferred to the floor.  He continued to have fevers and tachycardia despite IV antibiotics.  He then developed dyspnea and was found to have pneumonia.  He was transferred to the ICU and intubated.  A CT on 8/31 was negative for an abscess.  A repeat CT on 9/4 showed an  intra-abdominal fluid collection, drain placed by IR, cultures did not yield any growth.  I D was consulted who recommended micafungin, meropenem and zyvox x3 weeks.   He was started on TPN for post op ileus.  He was found to have AKI which resolved with IV hydration.  His respiratory status improved, extubated and transferred to the floor.  Another CT on 9/10 showed no significant residual pelvic abscess, decreased left subdiaphragmatic collection, perihepatic fluid. 5x1.7x1.1cm abscess above the urinary bladder,which IR was unable to drain.  He was continued on antibiotics.  The transgluteal drain was removed.  He rain intermittent fevers, subsequent work up was negative.  He was mobilized, diet was advanced, TPN was stopped.  A final CT on 9/16 showed a smaller 2.3x1.9 fluid collection above the bladder.  The last drain was removed from RUQ.  He was afebrile x48hrs, white count was normal, VSS.  He was therefore felt stable for discharge.  I discussed his IV antibiotics regimen with Dr. Ninetta Lights who recommended a total of 3 weeks of therapy.  A PICC line was placed and home health initiated.  We communicated through his uncle due to having difficulties finding an interpreter with the particular dialect.  The patient was asked to bring along english speaking family as we will continue to have issues finding an interpreter, unfortunately.  I have scheduled his follow up with Dr. Janee Morn and Dr. Ninetta Lights.  He was advised that he may not return to work at present  time, we will clear him at follow up or when his PICC line is out.  Lastly, he had elevated blood pressure and heart rate, started on metoprolol.  He was given Rx for metoprolol, advised to follow up with PCP for further Rx.  He was encouraged to call with questions or concerns.      Discharge Instructions     Medication List         acetaminophen 325 MG tablet  Commonly known as:  TYLENOL  Take 325 mg by mouth every 4 (four) hours as needed  (pain).     linezolid 2 MG/ML IVPB  Commonly known as:  ZYVOX  Inject 300 mLs (600 mg total) into the vein every 12 (twelve) hours.     meropenem 1 g in sodium chloride 0.9 % 100 mL  Inject 1 g into the vein every 8 (eight) hours.     metoprolol 50 MG tablet  Commonly known as:  LOPRESSOR  Take 1 tablet (50 mg total) by mouth 2 (two) times daily.     micafungin 100 mg in sodium chloride 0.9 % 100 mL  Inject 100 mg into the vein daily.     oxyCODONE-acetaminophen 5-325 MG per tablet  Commonly known as:  PERCOCET/ROXICET  Take 1-2 tablets by mouth every 4 (four) hours as needed for moderate pain or severe pain.     polyethylene glycol packet  Commonly known as:  MIRALAX / GLYCOLAX  Take 17 g by mouth daily.       Follow-up Information   Follow up with [name], MD.   Specialty:  Infectious Diseases   Contact information:   [address] [place] [address] [phone]       Follow up with [name], MD On 02/14/2014. (arrive 8:45AM for your post op check)    Specialty:  General Surgery   Contact information:   [address] [place] [address] [phone]       Follow up with primary care doctor. (for management of high blood pressure and further prescriptions for metoprolol )        The results of significant diagnostics from this hospitalization (including imaging, microbiology, ancillary and laboratory) are listed below for reference.    Significant Diagnostic Studies: Dg Chest 2 View  01/23/2014   CLINICAL DATA:  Fevers  EXAM: CHEST  2 VIEW  COMPARISON:  01/19/2014  FINDINGS: The cardiac shadow is stable. The previously seen left jugular line is been removed in the interval. Patchy infiltrative changes are again identified bilaterally and stable. Blunting of left posterior costophrenic angle is noted consistent with new effusion.  IMPRESSION: Patchy infiltrates bilaterally stable from the prior exam.  New left-sided  effusion.   Electronically Signed   By: Alcide Clever M.D.   On: 01/23/2014 10:29   Dg Chest 2 View  01/19/2014   CLINICAL DATA:  Chest pain; lung infiltrates  EXAM: CHEST  2 VIEW  COMPARISON:  Chest CT January 13, 2014 and chest radiograph January 17, 2014  FINDINGS: There has been partial clearing of consolidation from the left lower lobe. There is also less consolidation in the left upper lobe. Patchy infiltrate in the left mid lung remains. Patchy infiltrate in the right upper lobe in right base regions remains without significant change. There is no new opacity.  Heart size and pulmonary vascularity are normal. No adenopathy. Central catheter tip is in the superior vena cava near the cavoatrial junction. No  pneumothorax. No bone lesions.  IMPRESSION: Partial but incomplete clearing of infiltrate from the left lung. Patchy infiltrate remains in the left mid lung. Patchy infiltrate on the right is not significantly changed. No new opacity. No change in cardiac silhouette.   Electronically Signed   By: Bretta Bang M.D.   On: 01/19/2014 10:53   Dg Chest 2 View  01/07/2014   CLINICAL DATA:  Postoperative fever.  Abdominal pain.  EXAM: CHEST  2 VIEW  COMPARISON:  CT abdomen and pelvis 01/05/2014.  FINDINGS: The heart size is exaggerated by low lung volumes. Bibasilar airspace disease is present. Bilateral pleural effusions are noted. Pulmonary vascular congestion is evident. Obstructed bowel is evident.  IMPRESSION: 1. Bibasilar airspace disease with air bronchograms concerning for bilateral pneumonia or aspiration. 2. Bilateral pleural effusions and pulmonary vascular congestion.   Electronically Signed   By: Gennette Pac M.D.   On: 01/07/2014 21:04   Dg Abd 1 View  01/10/2014   CLINICAL DATA:  Nasogastric tube placement.  EXAM: ABDOMEN - 1 VIEW  COMPARISON:  Abdominal CT from yesterday  FINDINGS: Gastric suction tube tip is at the level of the pylorus. Persistent dilatation of small bowel,  measuring up to 7 cm in diameter. Previously administered oral contrast has progressed distally, visible currently in the descending colon. Surgical drain again noted in the right lower quadrant.  IMPRESSION: 1. Nasogastric tube tip at the pylorus. 2. Unchanged small bowel dilatation.   Electronically Signed   By: Tiburcio Pea M.D.   On: 01/10/2014 13:38   Ct Chest W Contrast  01/13/2014   CLINICAL DATA:  Ruptured appendix.  Sepsis.  EXAM: CT CHEST WITH CONTRAST  CT ABDOMEN AND PELVIS WITH CONTRAST  TECHNIQUE: Multidetector CT imaging of the chest was performed during intravenous contrast administration. Multidetector CT imaging of the abdomen and pelvis was performed following the standard protocol before and during bolus administration of intravenous contrast.  CONTRAST:  80mL OMNIPAQUE IOHEXOL 300 MG/ML  SOLN  COMPARISON:  Chest x-ray 01/13/2014 and 01/10/2014. CT abdomen pelvis 01/09/2014.  FINDINGS: CT CHEST FINDINGS  Thoracic aorta normal in caliber. No aneurysm. Heart size normal. Left IJ catheter noted with its tip in the lower superior vena cava. NG tube noted with tip in the stomach.  Shotty mediastinal lymph nodes.  Esophagus is nondistended.  Large airways are patent. Severe dense diffuse pulmonary infiltrates are present with this basilar atelectasis and moderate bilateral pleural effusions. The prominent dense patchy bilateral pulmonary infiltrates are most consistent with pneumonia. Process such as pulmonary edema/ARDS cannot be excluded.  Thyroid is unremarkable. No significant axillary adenopathy. No acute bony abnormality identified.  CT ABDOMEN AND PELVIS FINDINGS  No focal hepatic abnormality. Spleen is unremarkable. Pancreas is unremarkable. No biliary distention. Gallbladder is nondistended. A pericholecystic/subhepatic fluid collection is present. Maximum diameter is 3 cm. This could represent ascites. A phlegmon and/or abscess cannot be excluded. Left subdiaphragmatic fluid collections  are noted. These measure up to 3.5 cm. This could represent loculated ascites. Seven Left medic phlegmon and/or abscess abscess on the left cannot be excluded. No air noted in these fluid collections. 5.3 cm fluid collection in the pelvis. This could represent ascites, however pelvic abscess cannot be excluded. No air noted within this fluid collection.  Adrenals are unremarkable. Symmetric bilateral renal perfusion. No focal renal abnormality. No evidence of hydronephrosis. The bladder is nondistended. Prostate is not enlarged.  No significant inguinal or retroperitoneal adenopathy. Abdominal aorta normal in caliber.  Right lower quadrant  drain catheter is noted. No evidence of right lower quadrant abscess. Inflammatory changes in the right lower quadrant noted consistent with prior appendicitis. Prominent thickening of small and large bowel walls. These changes may be reactive. Enterocolitis could present in this fashion. Ischemic bowel cannot be excluded. Stomach is nondistended. No evidence of free air. No abdominal wall significant hernia.  No acute bony abnormality identified.  IMPRESSION: 1. Severe diffuse dense bilateral pulmonary infiltrates most consistent severe bilateral pneumonia. Dense bibasilar atelectasis and moderate bilateral pleural effusions are present. 2. Prominent fluid collections are noted in the pericholecystic/ right subhepatic region, left subdiaphragmatic region, and in the pelvis. These could represent loculated regions of ascites however phlegmon/ developing abscess abscess in these regions cannot be excluded. 3. Severe thickened small large bowel wall. Although these changes may be reactive, enterocolitis or ischemic bowel cannot be excluded. 4. Right lower quadrant drainage catheter is again noted. No prominent fluid collections in this region. Adjacent inflammatory changes are present from patient's prior appendicitis.   Electronically Signed   By: Maisie Fus  Register   On: 01/13/2014  13:11   Ct Abdomen Pelvis W Contrast  01/25/2014   CLINICAL DATA:  Follow-up intra abdominal abscesses, post appendectomy, ruptured appendicitis on 01/05/2014  EXAM: CT ABDOMEN AND PELVIS WITH CONTRAST  TECHNIQUE: Multidetector CT imaging of the abdomen and pelvis was performed using the standard protocol following bolus administration of intravenous contrast.  CONTRAST:  OMNIPAQUE IOHEXOL 300 MG/ML  SOLN  COMPARISON:  01/19/2014  FINDINGS: Bilateral small pleural effusion left greater than right. There is atelectasis or infiltrate in right lower lobe posteriorly.  Sagittal images of the spine shows degenerative changes lumbar spine.  There is trace perihepatic ascites. Small perihepatic collection just posterior to gallbladder measures 2.2 cm on the prior exam measures 3.4 cm. Trace pericholecystic fluid.  Enhanced liver shows no focal mass. Small left sub- diaphragmatic collection/abscess measures 1.5 cm decreased in size from prior exam when measures 2.5 cm. Kidneys are symmetrical in size and enhancement. No hydronephrosis or hydroureter. There is a cyst in midpole of the left kidney measures 1 cm. Delayed renal images shows bilateral renal symmetrical excretion.  There is a postsurgical drain in right lower quadrant. Residual fluid is noted in right lower quadrant just medial to the cecum measures 1.3 cm on the prior exam measures 3.3 cm. Minimal distended small bowel loops in lower abdomen probable minimal ileus. There is residual thickening of sigmoid colon wall. Residual abscess between anterior wall sigmoid and urinary bladder measures 1.5 cm decreased in size from prior exam when measures 5 cm. The second pelvic 10 has been removed. There is a residual abscess in the pelvis centrally just above the rectum measures 2.3 x 1.9 cm. The urinary bladder is unremarkable. Contrast material is noted within rectum. No evidence of colonic obstruction. No evidence of small bowel obstruction. Small amount of  residual fluid/stranding is noted in right lower quadrant mesentery.  There is no evidence of free abdominal air.  Persistent residual mild thickening of sigmoid colon wall probable due to adjacent inflammatory changes. Stable postsurgical changes in right lower quadrant.  IMPRESSION: 1. There is small bilateral pleural effusion left greater than right. Right lower lobe posterior atelectasis or infiltrate. 2. Decreased in size of fluid collections left subdiaphragmatic, right perihepatic just posterior to gallbladder fossa, right lower quadrant and in the pelvis between sigmoid colon and urinary bladder. Residual thickening of sigmoid colon wall probable due to adjacent inflammatory changes. 3. Small amount of  stranding and fluid in right lower quadrant mesentery with improvement from prior exam. Persistent drain in right lower quadrant. The second drain in midline pelvis has been removed. Residual abscess in midline pelvis above the bladder and rectum measures 2.3 x 1.9 cm. 4. No hydronephrosis or hydroureter. 5. No small bowel or colonic obstruction.   Electronically Signed   By: Natasha Mead M.D.   On: 01/25/2014 11:07   Ct Abdomen Pelvis W Contrast  01/19/2014   CLINICAL DATA:  Ruptured appendicitis on 01/05/2014, post laparoscopic appendectomy and drainage of intra-abdominal abscess, postoperative sepsis  EXAM: CT ABDOMEN AND PELVIS WITH CONTRAST  TECHNIQUE: Multidetector CT imaging of the abdomen and pelvis was performed using the standard protocol following bolus administration of intravenous contrast. Sagittal and coronal MPR images reconstructed from axial data set.  CONTRAST:  80mL OMNIPAQUE IOHEXOL 300 MG/ML SOLN IV. Dilute oral contrast.  COMPARISON:  01/13/2014, 01/14/2014  FINDINGS: RIGHT lower lobe consolidation.  Bibasilar effusions and atelectasis.  Small splenic cleft.  Liver, spleen, pancreas, kidneys, and adrenal glands otherwise normal.  Minimal perihepatic free fluid, decreased.  Small focal  perihepatic collection near the gallbladder fossa, decreased in size, now measuring 3.4 x 2.2 cm image 31.  Small focal fluid collection under LEFT diaphragm question loculated ascites versus subdiaphragmatic abscess 3.5 x 2.0 cm image 14.  Scattered areas of bowel wall thickening involving large and small bowel loops in the RIGHT lower quadrant and pelvis likely related to inflammation.  Two drainage catheters are identified, low central pelvis and RIGHT lower quadrant.  Small residual fluid collection medial to cecum, 3.5 x 2.2 cm, image 54.  No evidence of bowel obstruction.  Small gas and fluid collection identified between the sigmoid colon and superior margin of the urinary bladder, 5.0 x 1.7 x 1.1 cm, best demonstrated on sagittal images, increased in size.  No additional focal abscess collections identified.  No mass, adenopathy, hernia, or free intraperitoneal air.  Bones unremarkable.  IMPRESSION: No significant residual fluid at site low central pelvic abscess collection which was drained.  Small amount of residual fluid within mesentery medial to cecum just above drainage catheter.  Decrease in size of LEFT subdiaphragmatic collection.  Decreased perihepatic fluid though a small more focal area of residual fluid is seen adjacent to the gallbladder fossa.  Above fluid collections could be sterile or infected, recommend continued followup.  Small abscess collection above urinary bladder containing gas and fluid, 5.0 x 1.7 x 1.1 cm.   Electronically Signed   By: Ulyses Southward M.D.   On: 01/19/2014 16:28   Ct Abdomen Pelvis W Contrast  01/13/2014   CLINICAL DATA:  Ruptured appendix.  Sepsis.  EXAM: CT CHEST WITH CONTRAST  CT ABDOMEN AND PELVIS WITH CONTRAST  TECHNIQUE: Multidetector CT imaging of the chest was performed during intravenous contrast administration. Multidetector CT imaging of the abdomen and pelvis was performed following the standard protocol before and during bolus administration of  intravenous contrast.  CONTRAST:  80mL OMNIPAQUE IOHEXOL 300 MG/ML  SOLN  COMPARISON:  Chest x-ray 01/13/2014 and 01/10/2014. CT abdomen pelvis 01/09/2014.  FINDINGS: CT CHEST FINDINGS  Thoracic aorta normal in caliber. No aneurysm. Heart size normal. Left IJ catheter noted with its tip in the lower superior vena cava. NG tube noted with tip in the stomach.  Shotty mediastinal lymph nodes.  Esophagus is nondistended.  Large airways are patent. Severe dense diffuse pulmonary infiltrates are present with this basilar atelectasis and moderate bilateral pleural effusions. The prominent  dense patchy bilateral pulmonary infiltrates are most consistent with pneumonia. Process such as pulmonary edema/ARDS cannot be excluded.  Thyroid is unremarkable. No significant axillary adenopathy. No acute bony abnormality identified.  CT ABDOMEN AND PELVIS FINDINGS  No focal hepatic abnormality. Spleen is unremarkable. Pancreas is unremarkable. No biliary distention. Gallbladder is nondistended. A pericholecystic/subhepatic fluid collection is present. Maximum diameter is 3 cm. This could represent ascites. A phlegmon and/or abscess cannot be excluded. Left subdiaphragmatic fluid collections are noted. These measure up to 3.5 cm. This could represent loculated ascites. Seven Left medic phlegmon and/or abscess abscess on the left cannot be excluded. No air noted in these fluid collections. 5.3 cm fluid collection in the pelvis. This could represent ascites, however pelvic abscess cannot be excluded. No air noted within this fluid collection.  Adrenals are unremarkable. Symmetric bilateral renal perfusion. No focal renal abnormality. No evidence of hydronephrosis. The bladder is nondistended. Prostate is not enlarged.  No significant inguinal or retroperitoneal adenopathy. Abdominal aorta normal in caliber.  Right lower quadrant drain catheter is noted. No evidence of right lower quadrant abscess. Inflammatory changes in the right lower  quadrant noted consistent with prior appendicitis. Prominent thickening of small and large bowel walls. These changes may be reactive. Enterocolitis could present in this fashion. Ischemic bowel cannot be excluded. Stomach is nondistended. No evidence of free air. No abdominal wall significant hernia.  No acute bony abnormality identified.  IMPRESSION: 1. Severe diffuse dense bilateral pulmonary infiltrates most consistent severe bilateral pneumonia. Dense bibasilar atelectasis and moderate bilateral pleural effusions are present. 2. Prominent fluid collections are noted in the pericholecystic/ right subhepatic region, left subdiaphragmatic region, and in the pelvis. These could represent loculated regions of ascites however phlegmon/ developing abscess abscess in these regions cannot be excluded. 3. Severe thickened small large bowel wall. Although these changes may be reactive, enterocolitis or ischemic bowel cannot be excluded. 4. Right lower quadrant drainage catheter is again noted. No prominent fluid collections in this region. Adjacent inflammatory changes are present from patient's prior appendicitis.   Electronically Signed   By: Maisie Fus  Register   On: 01/13/2014 13:11   Ct Abdomen Pelvis W Contrast  01/09/2014   CLINICAL DATA:  Ruptured appendicitis 3 days ago now with increasing abdominal distention.  EXAM: CT ABDOMEN AND PELVIS WITH CONTRAST  TECHNIQUE: Multidetector CT imaging of the abdomen and pelvis was performed using the standard protocol following bolus administration of intravenous contrast.  CONTRAST:  OMNIPAQUE IOHEXOL 300 MG/ML SOLN intravenously ; the patient also received oral contrast material.  COMPARISON:  Abdominal pelvic CT scan of January 05, 2014  FINDINGS: Contrast is present in the stomach and proximal and mid small bowel. The small bowel loops are mildly distended with gas and the contrast. There is a loop of unopacified distal small bowel on images 61-82 position superior  and medial to the drainage catheter. There is a small amount of increased density within the adjacent extraluminal soft tissues demonstrated on images 63 through 69. This likely reflects a suture line though a small amount of extraluminal contrast is not absolutely excluded. Small amounts of low density interloop fluid persists. There is no discrete drainable abscess.  There is contrast in the colon likely remaining from the previous CT scan. There is mild thickening of the colonic wall of the ascending colon and rectosigmoid without adjacent abscess formation.  The liver, gallbladder, pancreas, spleen, adrenal glands, and kidneys exhibit no acute abnormalities. There is a moderate amount of fluid along  the inferior surface of the liver with a smaller amount in the paracolic gutter. The stomach contains a nasogastric tube. There is a small amount of free fluid in the pelvis. The urinary bladder is partially distended and contains a Foley catheter. The lung bases exhibit parenchymal consolidation posteriorly consistent with pneumonia.  IMPRESSION: 1. There is a small bowel ileus. There remains interloop fluid as well as considerable fluid in the subhepatic region. A drainage catheter is in place in the right lower abdomen and upper pelvis. A small amount of radiodense material adjacent to the drainage catheter on images 64-66 likely reflects suture, but a tiny amount of extraluminal contrast is not excluded. No extraluminal contrast collections are demonstrated elsewhere. There is no no new discrete drainable abscess. 2. The small bowel gas pattern is consistent with an ileus. There is a loop of thick-walled un-opacified distal small bowel in the right lower quadrant. 3. The gallbladder is mildly distended without evidence of stones. 4. There is bibasilar pneumonia.   Electronically Signed   By: David  [place]   On: 01/09/2014 11:47   Ct Abdomen Pelvis W Contrast  01/05/2014   CLINICAL DATA:  Right lower quadrant  abdominal pain and fever.  EXAM: CT ABDOMEN AND PELVIS WITH CONTRAST  TECHNIQUE: Multidetector CT imaging of the abdomen and pelvis was performed using the standard protocol following bolus administration of intravenous contrast.  CONTRAST:  OMNIPAQUE IOHEXOL 300 MG/ML  SOLN  COMPARISON:  None.  FINDINGS: Moderate bibasilar airspace disease is present. There is some ground-glass attenuation in the right middle lobe as well. The heart size is normal. There is no significant pleural or pericardial effusion.  Liver and spleen are within normal limits. Small hiatal hernia is present. The stomach, duodenum, and pancreas are otherwise within normal limits. Common bile duct and gallbladder are normal. The adrenal glands are normal bilaterally. Kidneys and ureters are unremarkable.  An 8 mm appendicolith is present. The appendix is markedly enlarged inflamed. There is evidence for rupture with free fluid in free air layering in the right lower quadrant. There is marked secondary inflammatory change of the distal small bowel loops. Additional or fluid may be an capsulated extending into the pelvis. The colon is fluid-filled.  The urinary bladder is within normal limits.  The bone windows demonstrate no focal lytic or blastic lesions.  IMPRESSION: 1. Ruptured appendicitis with scattered air-fluid levels in the right lower quadrant. 2. Potentially encapsulated fluid extending from the lower right lower quadrant into the pelvis. 3. Marked secondary inflammatory changes of the distal small bowel. 4. Bibasilar airspace disease likely reflects atelectasis.   Electronically Signed   By: Gennette Pac M.D.   On: 01/05/2014 13:45   Dg Chest Port 1 View  01/17/2014   CLINICAL DATA:  Followup respiratory failure.  EXAM: PORTABLE CHEST - 1 VIEW  COMPARISON:  01/16/2014.  FINDINGS: There are patchy areas of confluence, as well as irregular interstitial opacities in both lungs, most prominent in the left upper lobe. Lung volumes  are low. Lung opacities are similar to the previous day's study allowing for lower lung volumes and the different patient positioning on the current exam.  No pneumothorax.  Left internal jugular central venous line and nasogastric tube are stable and well positioned.  IMPRESSION: 1. No significant change from the previous day's study. 2. Persistent bilateral areas of lung confluent opacity and interstitial densities most likely due to multifocal pneumonia.   Electronically Signed   By: Renard Hamper.D.  On: 01/17/2014 07:42   Dg Chest Port 1 View  01/16/2014   CLINICAL DATA:  Pneumonia, shortness of breath  EXAM: PORTABLE CHEST - 1 VIEW  COMPARISON:  01/15/2014; 01/13/2014; 01/13/2014; CT the chest, abdomen pelvis - 01/13/2014  FINDINGS: Grossly unchanged cardiac silhouette and mediastinal contours given persistently reduced lung volumes and patient rotation. Stable position of support apparatus. The pulmonary vasculature remains indistinct with cephalization of flow. Grossly unchanged extensive bilateral heterogeneous airspace opacities. Small trace bilateral effusions are unchanged, right greater than left. No pneumothorax. Unchanged bones.  IMPRESSION: 1.  Stable positioning of support apparatus.  No pneumothorax. 2. Grossly unchanged findings of pulmonary edema and extensive bilateral heterogeneous airspace opacities worrisome for multifocal infection. 3. Unchanged small/trace bilateral effusions, right greater than left.   Electronically Signed   By: Simonne Come M.D.   On: 01/16/2014 07:47   Dg Chest Port 1 View  01/15/2014   CLINICAL DATA:  Evaluate for pulmonary infiltrates  EXAM: PORTABLE CHEST - 1 VIEW  COMPARISON:  01/14/2014; 01/13/2014; chest CT -01/13/2014  FINDINGS: Grossly unchanged borderline enlarged cardiac silhouette and mediastinal contours. Stable positioning of support apparatus. There is persistent mild elevation of the right hemidiaphragm. Pulmonary vasculature remains indistinct.  Extensive bilateral heterogeneous airspace opacities are unchanged. Interval development of a small / trace right-sided pleural effusion. No new focal airspace opacities. Unchanged bones. Enteric contrast is seen within these splenic flexure of the colon.  IMPRESSION: 1.  Stable positioning of support apparatus.  No pneumothorax. 2. Grossly unchanged findings of pulmonary edema and extensive bilateral heterogeneous airspace opacities worrisome for multifocal infection. 3. Interval increase in small right-sided pleural effusion   Electronically Signed   By: Simonne Come M.D.   On: 01/15/2014 07:24   Dg Chest Port 1 View  01/14/2014   CLINICAL DATA:  Follow-up infiltrates  EXAM: PORTABLE CHEST - 1 VIEW  COMPARISON:  CT chest dated 01/13/2014  FINDINGS: Multifocal patchy opacities, right lung predominant, suspicious for multifocal pneumonia. Small bilateral pleural effusions. No pneumothorax.  Mild cardiomegaly.  Left IJ venous catheter terminating at the cavoatrial junction. Enteric tube terminating in the proximal duodenum.  IMPRESSION: Multifocal patchy opacities, right lung predominant, suspicious for pneumonia.  Small bilateral pleural effusions.   Electronically Signed   By: Charline Bills M.D.   On: 01/14/2014 07:12   Dg Chest Port 1 View  01/13/2014   CLINICAL DATA:  Fever, short of breath ?pneumonia  EXAM: PORTABLE CHEST - 1 VIEW  COMPARISON:  01/10/2014  FINDINGS: Increased patchy airspace opacity along the periphery right lung. Decreased retrocardiac opacity with some residual Hypoaeration. Interstitial and vascular crowding. Elevated right hemidiaphragm. Right pleural effusion not excluded. Left IJ central venous catheter tip projects over the cavoatrial junction. NG tube descends over the distal stomach.  IMPRESSION: Hypoaeration and bibasilar opacities. Increased peripheral right lung opacity, may reflect atelectasis and/or pneumonia.   Electronically Signed   By: Jearld Lesch M.D.   On:  01/13/2014 04:47   Dg Chest Port 1 View  01/10/2014   CLINICAL DATA:  Sepsis.  Respiratory distress.  EXAM: PORTABLE CHEST - 1 VIEW  COMPARISON:  Single view of the chest 01/09/2014 and 01/08/2014.  FINDINGS: Support tubes and lines are unchanged. Lung volumes are low with basilar airspace opacities, worse on the left. No pneumothorax is identified. Heart size is normal.  IMPRESSION: Left worse than right basilar airspace opacities are likely due to atelectasis in this low volume chest. The appearance is not markedly changed.  Electronically Signed   By: Drusilla Kanner M.D.   On: 01/10/2014 07:30   Dg Chest Port 1 View  01/09/2014   CLINICAL DATA:  Ventilator dependence.  EXAM: PORTABLE CHEST - 1 VIEW  COMPARISON:  01/08/2014  FINDINGS: 0512 hrs. Lung volumes are low. Endotracheal tube tip is approximately 2.0 cm above the base of the chronic. A left IJ central line remains in place with tip position in the upper to mid right atrium. NG tube is looped in the stomach with the tip overlying the fundus. Pulmonary vascular congestion persists with basilar atelectasis and small bilateral pleural effusions. Gaseous dilatation of small bowel in the left abdomen does not appear substantially changed in the interval.  IMPRESSION: Low volume film with vascular congestion and bibasilar atelectasis. Small bilateral pleural effusions persist.  Gaseous small bowel dilatation in the visualized left upper abdomen.   Electronically Signed   By: Kennith Center M.D.   On: 01/09/2014 07:02   Dg Chest Port 1 View  01/08/2014   CLINICAL DATA:  Intubation.  EXAM: PORTABLE CHEST - 1 VIEW  COMPARISON:  01/08/2014.  FINDINGS: Endotracheal tube noted 2.9 cm above the carina. Left IJ line noted with tip in right atrium. NG tube noted coiled in stomach. Cardiomegaly with mild pulmonary vascular prominence. Poor inspiration with prominent bibasilar atelectasis. Small pleural effusions. No pneumothorax. No acute bony abnormality.  Distended loops of bowel are noted.  IMPRESSION: 1. Endotracheal tube noted with its tip 2.9 cm above the carina. Left IJ line noted with its tip in the right atrium. NG tube noted coiled in stomach. 2. Cardiomegaly with mild pulmonary vascular prominence. 3. Poor inspiration with prominent bibasilar atelectasis. Small pleural effusions. 4. Bowel distention noted. Abdominal series can be obtained to further evaluate as needed.   Electronically Signed   By: Maisie Fus  Register   On: 01/08/2014 18:10   Dg Chest Port 1 View  01/08/2014   CLINICAL DATA:  Shortness of breath. Productive cough, hypertension.  EXAM: PORTABLE CHEST - 1 VIEW  COMPARISON:  01/07/2014  FINDINGS: Low lung volumes with bibasilar opacities, stable. Mild cardiomegaly. Vascular congestion. Possible small bilateral effusions. No real change since prior study.  IMPRESSION: Low lung volumes with continued bibasilar opacities. Cannot exclude pneumonia. Small bilateral effusions. No change.   Electronically Signed   By: Charlett Nose M.D.   On: 01/08/2014 16:31   Dg Abd 2 Views  01/07/2014   CLINICAL DATA:  Distended bowel.  EXAM: ABDOMEN - 2 VIEW  COMPARISON:  01/05/2014 CT  FINDINGS: Gaseous distention of small bowel loops as well as the transverse colon and stomach. Contrast within decompressed descending colon and sigmoid colon. No free air organomegaly. Surgical drain in the right lower quadrant. No acute bony abnormality.  IMPRESSION: Nonspecific bowel gas pattern with gaseous distention of the stomach, small bowel and a transverse colon. Favor ileus. Small bowel dilatation has increased since prior study.   Electronically Signed   By: Charlett Nose M.D.   On: 01/07/2014 21:13   Dg Abd Portable 1v  01/17/2014   CLINICAL DATA:  NG tube placement, ileus.  EXAM: PORTABLE ABDOMEN - 1 VIEW  COMPARISON:  01/13/2014  FINDINGS: NG tube tip is difficult to visualize but appears to be within the fundus of the stomach, partially obscured due to motion.  Gas within a few scattered prominent small bowel loops and throughout the colon, not significantly changed since prior study. No free air.  IMPRESSION: NG tube tip is obscured by  motion but appears to be in the gastric fundus. No change in bowel gas pattern   Electronically Signed   By: Charlett Nose M.D.   On: 01/17/2014 09:06   Dg Abd Portable 1v  01/13/2014   CLINICAL DATA:  Abdominal pain, recent appendectomy.  EXAM: PORTABLE ABDOMEN - 1 VIEW  COMPARISON:  01/10/2014  FINDINGS: NG tube tip projects over the distal stomach. There is contrast within colon. Nonspecific small bowel loop dilatation in the left upper quadrant. Surgical drain right lower quadrant and surgical clips in keeping with recent history of appendectomy.  IMPRESSION: NG tube tip projects over the distal stomach.  Postsurgical changes of recent appendectomy with drain in place.  Dilated loop of small bowel loops in the left upper abdomen is nonspecific and may reflect ileus. There is air and previously ingested contrast within normal caliber colon.   Electronically Signed   By: Jearld Lesch M.D.   On: 01/13/2014 04:45   Dg Abd Portable 1v  01/08/2014   CLINICAL DATA:  Generalized abdominal pain.  EXAM: PORTABLE ABDOMEN - 1 VIEW  COMPARISON:  01/07/2014  FINDINGS: Right lower quadrant drain remains in place. Continued gaseous distention of bowel, similar to prior study. This is predominately stomach and small bowel. No change since prior study. No free air.  IMPRESSION: Stable gaseous distention of stomach and small bowel. This is presumably ileus although small bowel obstruction cannot be completely excluded.   Electronically Signed   By: Charlett Nose M.D.   On: 01/08/2014 16:46   Ct Image Guided Drainage By Percutaneous Catheter  01/14/2014   CLINICAL DATA:  Status post appendectomy for ruptured appendicitis. Rising white blood cell count despite antibiotics with CT showing focal fluid collection in the deep pelvis. The patient  presents for aspiration with possible catheter drainage of the pelvic fluid collection.  EXAM: CT GUIDED PERCUTANEOUS CATHETER DRAINAGE OF PELVIC PERITONEAL ABSCESS  ANESTHESIA/SEDATION: 0.5 Mg IV Versed 25 mcg IV Fentanyl  Total Moderate Sedation Time:  15 minutes  PROCEDURE: The procedure, risks, benefits, and alternatives were explained to the patient. Questions regarding the procedure were encouraged and answered. The patient understands and consents to the procedure. An interpreter was used.  The right gluteal region was prepped with Betadine in a sterile fashion, and a sterile drape was applied covering the operative field. A sterile gown and sterile gloves were used for the procedure. Local anesthesia was provided with 1% Lidocaine.  The patient was placed in a prone position. CT was performed through the pelvis. Under CT fluoroscopic guidance, an 18 gauge trocar needle was advanced from a right trans gluteal approach into the pelvis. After confirming needle tip position by CT, aspiration of fluid was performed and a sample sent for culture analysis. 15 mL of fluid was drawn off for culture analysis.  A guidewire was advanced through the needle into the collection. The needle was removed. The tract was dilated and a 12 French percutaneous drain placed. Drainage catheter position was confirmed by CT. The catheter was flushed with saline and connected to a suction bulb. It was secured at the skin with a Prolene retention suture and adhesive StatLock device.  COMPLICATIONS: None  FINDINGS: Needle aspiration at the level of the deep pelvic fluid collection anterior to the rectum yielded yellow colored turbid fluid. Based on appearance of the fluid, decision was made to place a percutaneous drainage catheter. After drain placement there is continued return of turbid fluid with suction bulb placement.  IMPRESSION: CT-guided  drainage of pelvic abscess. Aspiration yielded turbid fluid. A sample was sent for culture  analysis. A 12 French drain was placed via a right trans gluteal approach.   Electronically Signed   By: Irish Lack M.D.   On: 01/14/2014 14:09    Microbiology: Recent Results (from the past 240 hour(s))  MALARIA SMEAR     Status: None   Collection Time    01/16/14 12:00 PM      Result Value Ref Range Status   Specimen Description BLOOD   Final   Special Requests NONE   Final   Malaria Prep     Final   Value: No Plasmodium or Other Blood Parasites Seen on Thick or Thin Smears For persons strongly suspected of having a blood parasite,but have negative smears, it is recommended that blood films be repeated approximately every 12 to 24 hours for 3 consecutive      days.     Performed at [facility]   Report Status 01/18/2014 FINAL   Final     Labs: Basic Metabolic Panel:  Recent Labs Lab 01/19/14 0600 01/25/14 0348  NA 133* 142  K 3.9 3.9  CL 97 104  CO2 24 21  GLUCOSE 123* 86  BUN 17 11  CREATININE 0.65 0.69  CALCIUM 7.6* 8.2*  MG 1.8  --   PHOS 3.3  --    Liver Function Tests:  Recent Labs Lab 01/19/14 0600  AST 74*  ALT 126*  ALKPHOS 150*  BILITOT 0.9  PROT 6.7  ALBUMIN 1.8*   No results found for this basename: LIPASE, AMYLASE,  in the last 168 hours No results found for this basename: AMMONIA,  in the last 168 hours CBC:  Recent Labs Lab 01/20/14 0500 01/21/14 0744 01/23/14 1026 01/25/14 0348  WBC 14.0* 10.6* 10.8* 8.7  NEUTROABS  --  7.5 8.4*  --   HGB 10.4* 10.3* 10.3* 10.1*  HCT 31.8* 32.4* 31.9* 31.5*  MCV 81.7 84.8 83.1 82.2  PLT 543* 494* 494* 469*   Cardiac Enzymes: No results found for this basename: CKTOTAL, CKMB, CKMBINDEX, TROPONINI,  in the last 168 hours BNP: BNP (last 3 results)  Recent Labs  01/08/14 0938  PROBNP 334.9*   CBG:  Recent Labs Lab 01/19/14 1950 01/20/14 0002 01/20/14 0405 01/20/14 0811 01/20/14 1146  GLUCAP 91 126* 94 87 103*    Active Problems:   Acute perforated appendicitis   Acute  respiratory failure   HTN (hypertension)   Sepsis   Severe sepsis(995.92)   ARDS (adult respiratory distress syndrome)   Time coordinating discharge: <30 mins  Signed:  Agusta Hackenberg, ANP-BC

## 2014-01-25 NOTE — Progress Notes (Signed)
Ct today, home today or tomorrow f/u with surgery and id

## 2014-01-25 NOTE — Progress Notes (Signed)
Patient ID: Jesus Hunter, male   DOB: Mar 14, 1971, 43 y.o.   MRN: [id number]     [address] [place], [place] [address]    Phone: [phone] FAX: [phone]     Subjective: No changes.  Afebrile.    Objective:  Vital signs:  Filed Vitals:   01/24/14 1400 01/24/14 1820 01/24/14 2213 01/25/14 0515  BP: 163/103  158/102 144/95  Pulse: 88  98 90  Temp: 97.7 F (36.5 C)  98.6 F (37 C) 98.4 F (36.9 C)  TempSrc: Oral  Oral Oral  Resp: _0 Height:      Weight:  152 lb 8.9 oz (69.2 kg)  152 lb 12.5 oz (69.3 kg)  SpO2: 99%  98% 97%    Last BM Date: 01/24/14  Intake/Output   Yesterday:  09/15 0701 - 09/16 0700 In: 1080 [P.O.:1080] Out: 1625 [Urine:1625] This shift:    I/O last 3 completed shifts: In: 1380 [P.O.:1080; IV Piggyback:300] Out: 2025 [Urine:2025]    Physical Exam:  General: Pt awake and alert.  Chest: cta.  CV: Pulses intact. Regular rhythm. tachy  Abdomen: Soft. nondistended. Non tender.  JP drains with serous output. No evidence of peritonitis. No incarcerated hernias.  Ext: SCDs BLE. No mjr edema. No cyanosis  Skin: No petechiae / purpura   Problem List:   Active Problems:   Acute perforated appendicitis   Acute respiratory failure   HTN (hypertension)   Sepsis   Severe sepsis(995.92)   ARDS (adult respiratory distress syndrome)    Results:   Labs: Results for orders placed during the hospital encounter of 01/05/14 (from the past 48 hour(s))  URINALYSIS, ROUTINE W REFLEX MICROSCOPIC     Status: None   Collection Time    01/24/14  5:47 AM      Result Value Ref Range   Color, Urine YELLOW  YELLOW   APPearance CLEAR  CLEAR   Specific Gravity, Urine 1.011  1.005 - 1.030   pH 6.0  5.0 - 8.0   Glucose, UA NEGATIVE  NEGATIVE mg/dL   Hgb urine dipstick NEGATIVE  NEGATIVE   Bilirubin Urine NEGATIVE  NEGATIVE   Ketones, ur NEGATIVE  NEGATIVE mg/dL   Protein, ur NEGATIVE   NEGATIVE mg/dL   Urobilinogen, UA 0.2  0.0 - 1.0 mg/dL   Nitrite NEGATIVE  NEGATIVE   Leukocytes, UA NEGATIVE  NEGATIVE   Comment: MICROSCOPIC NOT DONE ON URINES WITH NEGATIVE PROTEIN, BLOOD, LEUKOCYTES, NITRITE, OR GLUCOSE <1000 mg/dL.  CBC     Status: Abnormal   Collection Time    01/25/14  3:48 AM      Result Value Ref Range   WBC 8.7  4.0 - 10.5 K/uL   RBC 3.83 (*) 4.22 - 5.81 MIL/uL   Hemoglobin 10.1 (*) 13.0 - 17.0 g/dL   HCT 31.5 (*) 39.0 - 52.0 %   MCV 82.2  78.0 - 100.0 fL   MCH 26.4  26.0 - 34.0 pg   MCHC 32.1  30.0 - 36.0 g/dL   RDW 13.2  11.5 - 15.5 %   Platelets 469 (*) 150 - 400 K/uL  BASIC METABOLIC PANEL     Status: Abnormal   Collection Time    01/25/14  3:48 AM      Result Value Ref Range   Sodium 142  137 - 147 mEq/L   Potassium 3.9  3.7 - 5.3 mEq/L  Chloride 104  96 - 112 mEq/L   CO2 21  19 - 32 mEq/L   Glucose, Bld 86  70 - 99 mg/dL   BUN 11  6 - 23 mg/dL   Creatinine, Ser 0.69  0.50 - 1.35 mg/dL   Calcium 8.2 (*) 8.4 - 10.5 mg/dL   GFR calc non Af Amer >90  >90 mL/min   GFR calc Af Amer >90  >90 mL/min   Comment: (NOTE)     The eGFR has been calculated using the CKD EPI equation.     This calculation has not been validated in all clinical situations.     eGFR's persistently <90 mL/min signify possible Chronic Kidney     Disease.   Anion gap 17 (*) 5 - 15    Imaging / Studies: No results found.  Medications / Allergies:  Scheduled Meds: . enoxaparin (LOVENOX) injection  40 mg Subcutaneous Q24H  . feeding supplement (RESOURCE BREEZE)  1 Container Oral TID BM  . linezolid  600 mg Intravenous Q12H  . meropenem (MERREM) IV  1 g Intravenous Q8H  . metoprolol tartrate  50 mg Oral BID  . micafungin [facility]) IV  100 mg Intravenous Daily  . polyethylene glycol  17 g Oral Daily   Continuous Infusions: . sodium chloride 10 mL/hr at 01/25/14 0514   PRN Meds:.acetaminophen (TYLENOL) oral liquid 160 mg/5 mL, acetaminophen, acetaminophen, albuterol,  hydrALAZINE, ondansetron (ZOFRAN) IV, oxyCODONE-acetaminophen  Antibiotics: Anti-infectives   Start     Dose/Rate Route Frequency Ordered Stop   01/24/14 1500  meropenem (MERREM) 1 g in sodium chloride 0.9 % 100 mL IVPB     1 g 200 mL/hr over 30 Minutes Intravenous Every 8 hours 01/24/14 1320     01/14/14 1000  linezolid (ZYVOX) IVPB 600 mg     600 mg 300 mL/hr over 60 Minutes Intravenous Every 12 hours 01/14/14 0748     01/12/14 1400  vancomycin (VANCOCIN) 1,250 mg in sodium chloride 0.9 % 250 mL IVPB  Status:  Discontinued     1,250 mg 166.7 mL/hr over 90 Minutes Intravenous Every 6 hours 01/12/14 1305 01/14/14 0747   01/11/14 1400  meropenem (MERREM) 1 g in sodium chloride 0.9 % 100 mL IVPB  Status:  Discontinued     1 g 200 mL/hr over 30 Minutes Intravenous 3 times per day 01/11/14 1138 01/24/14 1320   01/11/14 1300  vancomycin (VANCOCIN) IVPB 1000 mg/200 mL premix  Status:  Discontinued     1,000 mg 200 mL/hr over 60 Minutes Intravenous Every 8 hours 01/11/14 1233 01/12/14 1305   01/10/14 1500  micafungin (MYCAMINE) 100 mg in sodium chloride 0.9 % 100 mL IVPB     100 mg 100 mL/hr over 1 Hours Intravenous Daily 01/10/14 1405     01/08/14 2200  vancomycin (VANCOCIN) IVPB 1000 mg/200 mL premix  Status:  Discontinued     1,000 mg 200 mL/hr over 60 Minutes Intravenous Every 12 hours 01/08/14 0913 01/10/14 1417   01/08/14 0945  vancomycin (VANCOCIN) 2,000 mg in sodium chloride 0.9 % 500 mL IVPB     2,000 mg 250 mL/hr over 120 Minutes Intravenous  Once 01/08/14 0913 01/08/14 1206   01/05/14 2200  piperacillin-tazobactam (ZOSYN) IVPB 3.375 g  Status:  Discontinued     3.375 g 12.5 mL/hr over 240 Minutes Intravenous 3 times per day 01/05/14 1847 01/11/14 1138   01/05/14 1345  piperacillin-tazobactam (ZOSYN) IVPB 3.375 g     3.375 g 100 mL/hr over  30 Minutes Intravenous  Once 01/05/14 1343 01/05/14 1445       Assessment/Plan  Gangrenous Ruptured Appendicitis  S/p laparoscopic  appendectomy---Dr. Grandville Silos 01/05/14  PCM  Post op ileus-resolving  POD#20  -regular diet, having BMs, pain controlled with POs, afebrile  -SCD/lovenox  -drain care(serous), no growth to date(42m/24h) will await CT and hopefully remove  -cultures-multiple organisms present from OR  -zosyn 8/27-9/2. Vanc 8/30--9/8 Micafungin 9/2---> Meropenem 9/1---> Zyvox 9/5 -->  -ID consult appreciated, recommend 3 weeks of Therapy, discussed again with Dr. HJohnnye Sima will f/u as OP  -repeat CT on 9/10 shows no significant residual pelvic abscess, decreased left subdiaphragmatic collection, perihepatic fluid.  5x1.7x1.1cm abscess above the urinary bladder, IR unable to drain.  -repeat CT today -place PICC line for home atbx -home health VDRF/PNA  -resolved, on RA  AKI-resolved  HTN-c/w metoprolol, will need Rx at DC and close follow up with primary care  Dispo-hopefully DC today after PICC line placement     EErby Pian [facility] Pager [phone](7A-4:30P) For consults and floor pages call [phone](7A-4:30P)  01/25/2014 10:56 AM

## 2014-01-25 NOTE — Progress Notes (Signed)
Patient's JP drain removed. Dry dressing placed over site. Discharge instructions given to patient. Prescriptions given to patient. Patient is awaiting teaching from [facility] before discharge.

## 2014-02-13 ENCOUNTER — Inpatient Hospital Stay: Payer: BC Managed Care – PPO | Admitting: Infectious Diseases

## 2015-09-30 IMAGING — CR DG ABD PORTABLE 1V
1 series · 1 of 1 positions shown · non-contrast
Comparison: 01/07/2014

CLINICAL DATA: Generalized abdominal pain.

EXAM:
PORTABLE ABDOMEN - 1 VIEW

[AP]
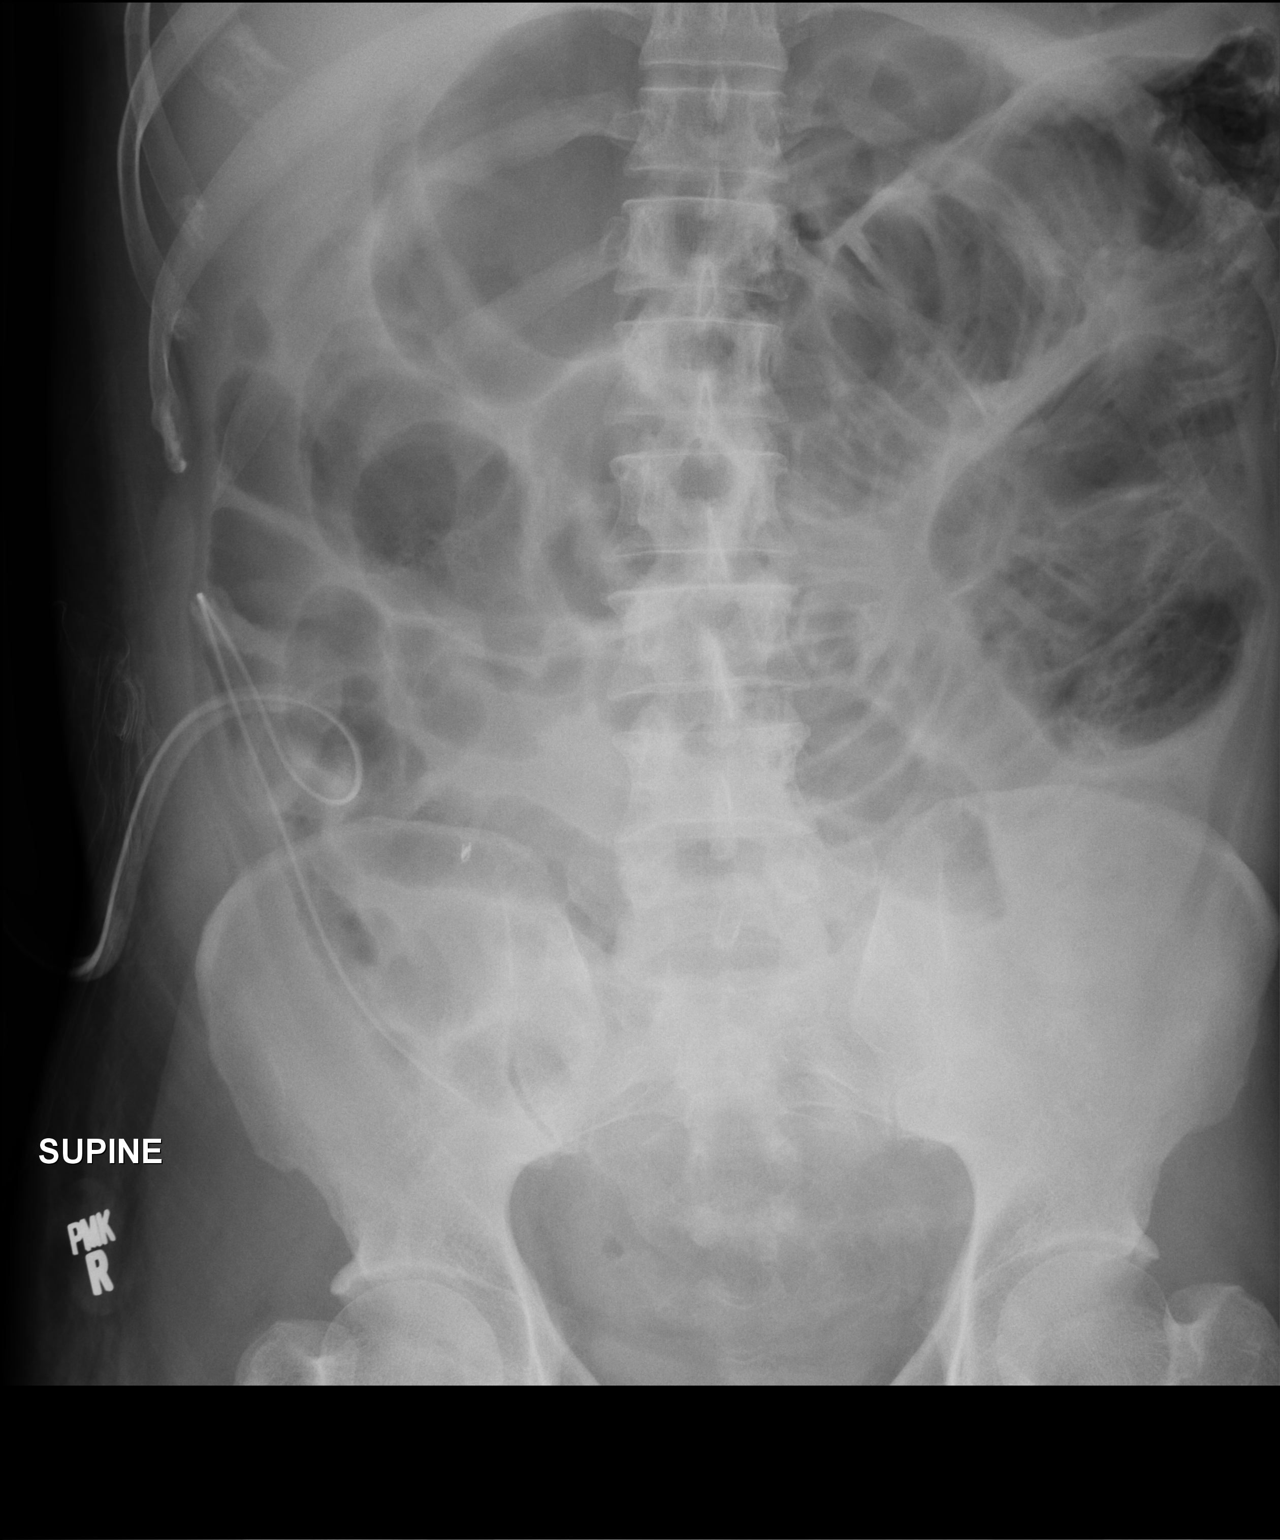

[1 of 1 positions shown; findings below may reference images not displayed]

FINDINGS: Right lower quadrant drain remains in place. Continued gaseous
distention of bowel, similar to prior study. This is predominately
stomach and small bowel. No change since prior study. No free air.
IMPRESSION: Stable gaseous distention of stomach and small bowel. This is
presumably ileus although small bowel obstruction cannot be
completely excluded.

## 2015-09-30 IMAGING — CR DG CHEST 1V PORT
1 series · 1 of 1 positions shown · non-contrast
Comparison: 01/08/2014.

CLINICAL DATA: Intubation.

EXAM:
PORTABLE CHEST - 1 VIEW

[AP]
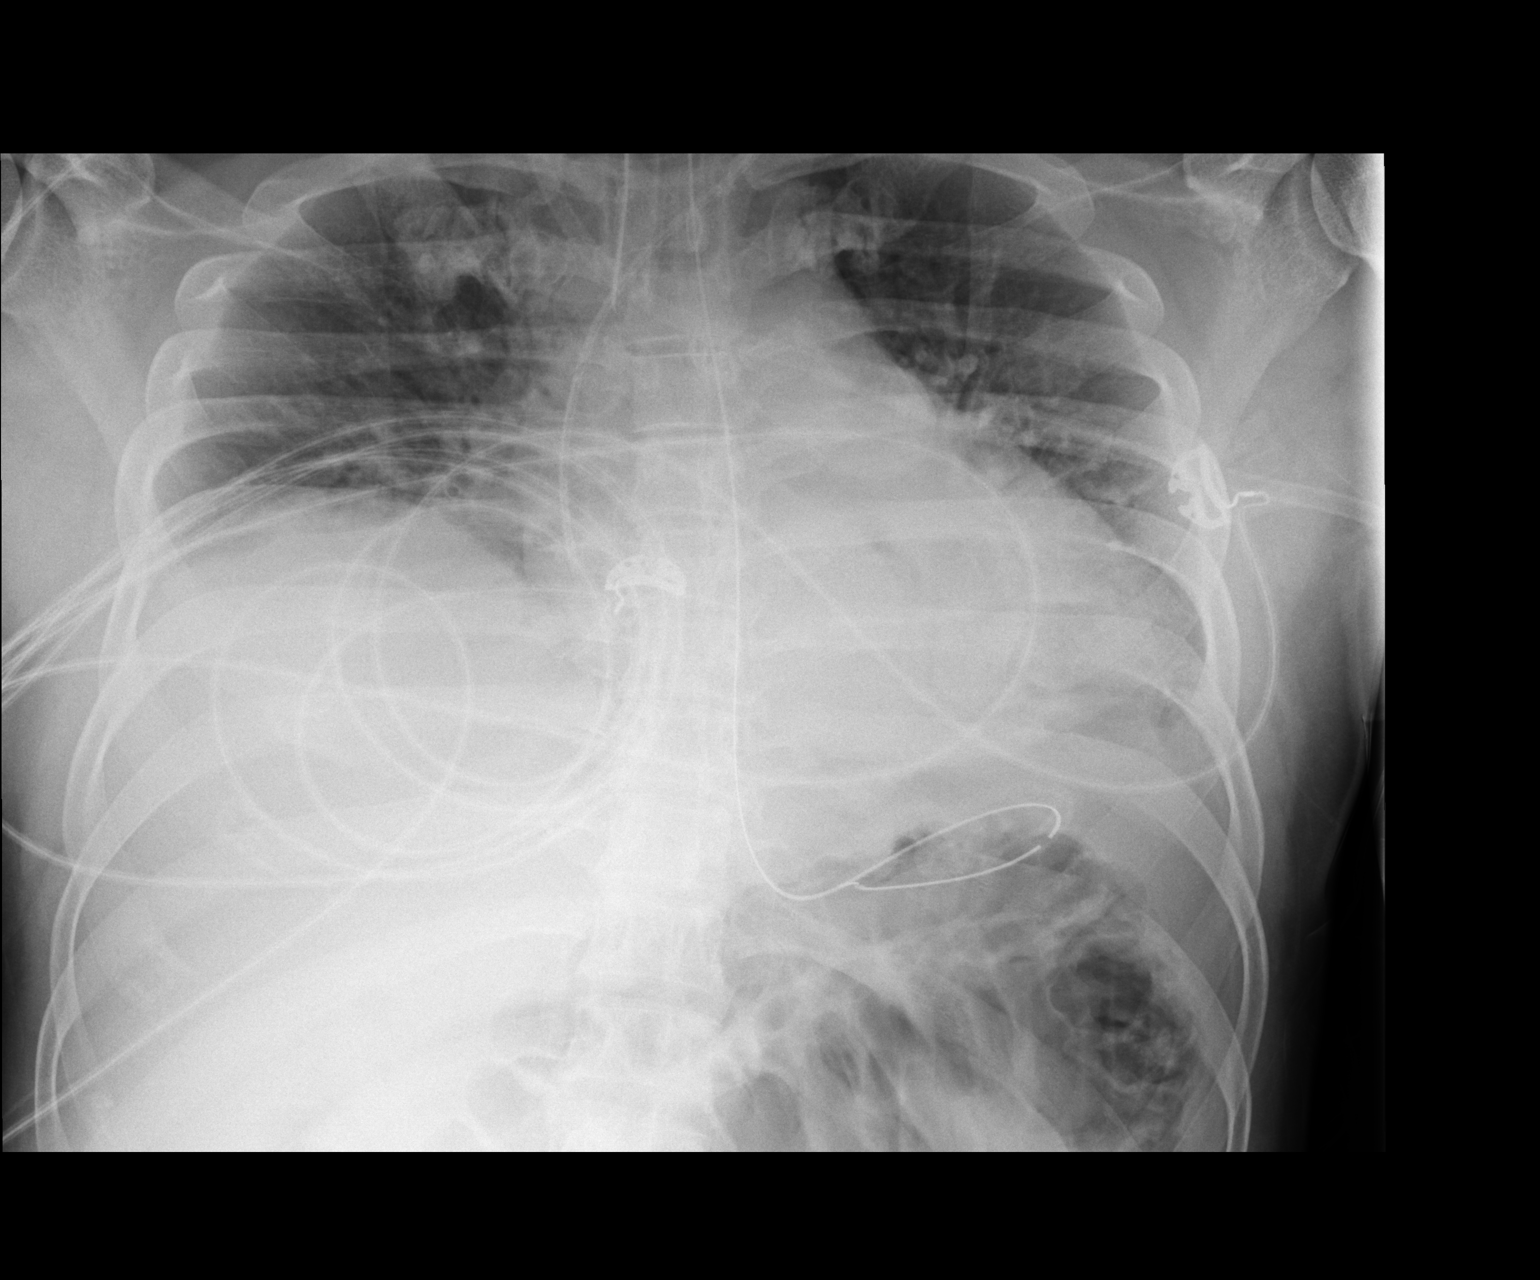

[1 of 1 positions shown; findings below may reference images not displayed]

FINDINGS: Endotracheal tube noted 2.9 cm above the carina. Left IJ line noted
with tip in right atrium. NG tube noted coiled in stomach.
Cardiomegaly with mild pulmonary vascular prominence. Poor
inspiration with prominent bibasilar atelectasis. Small pleural
effusions. No pneumothorax. No acute bony abnormality. Distended
loops of bowel are noted.
IMPRESSION: 1. Endotracheal tube noted with its tip 2.9 cm above the carina.
Left IJ line noted with its tip in the right atrium. NG tube noted
coiled in stomach.
2. Cardiomegaly with mild pulmonary vascular prominence.
3. Poor inspiration with prominent bibasilar atelectasis. Small
pleural effusions.
4. Bowel distention noted. Abdominal series can be obtained to
further evaluate as needed.

## 2015-10-01 IMAGING — CT CT ABD-PELV W/ CM
2 of 5 series · 9 of 46 positions shown, 10 images · IV contrast (Iodine)
Comparison: Abdominal pelvic CT scan January 05, 2014

CLINICAL DATA: Ruptured appendicitis 3 days ago now with increasing
abdominal distention.

EXAM:
CT ABDOMEN AND PELVIS WITH CONTRAST
TECHNIQUE: Multidetector CT imaging of the abdomen and pelvis was performed
using the standard protocol following bolus administration of
intravenous contrast.
CONTRAST:  100mL OMNIPAQUE IOHEXOL 300 MG/ML SOLN intravenously ;
the patient also received oral contrast material.

[Series 201: routine, idose (2) · axial · 0.78mm/px · z∈[+409,+839]mm · 6 of 110 slices shown, 7 images]
[im 12/110  soft-tissue]
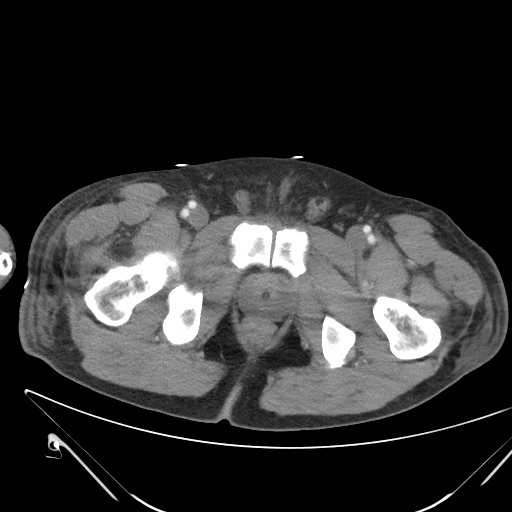
[im 12/110  bone]
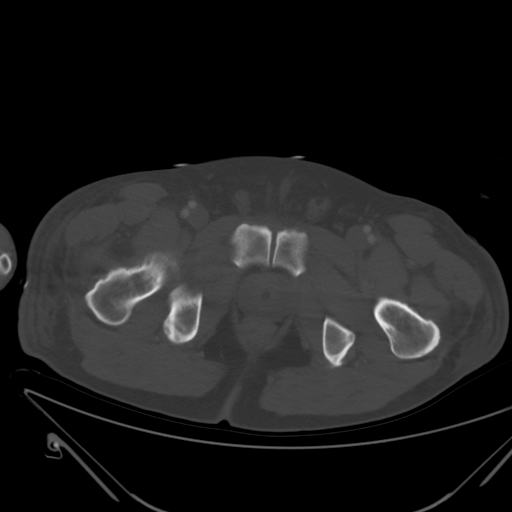
[im 29/110  soft-tissue]
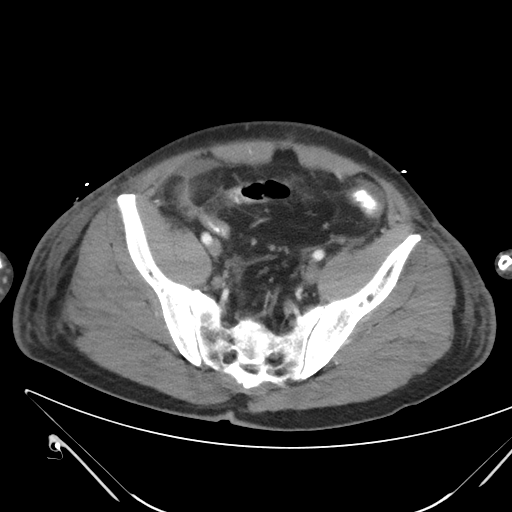
[im 46/110  soft-tissue]
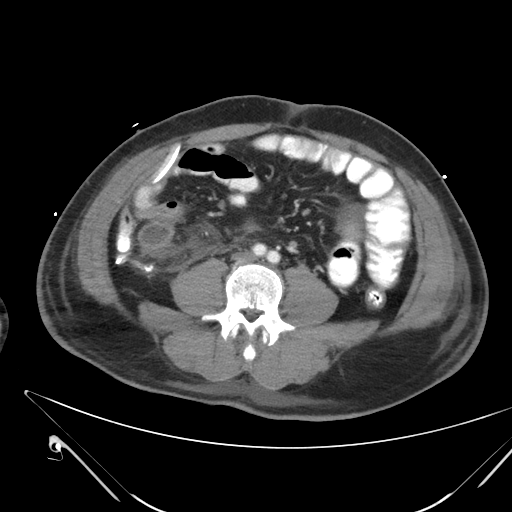
[im 64/110  soft-tissue]
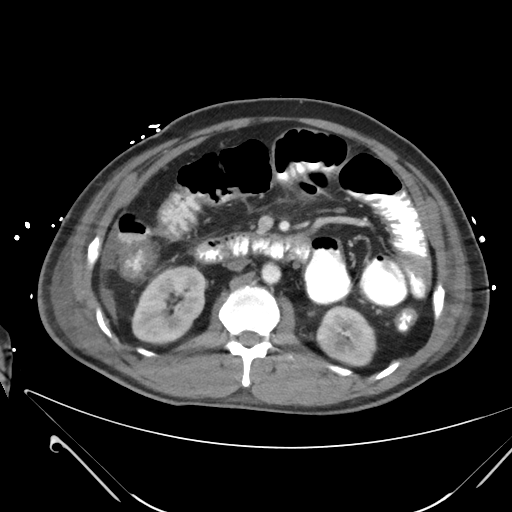
[im 81/110  soft-tissue]
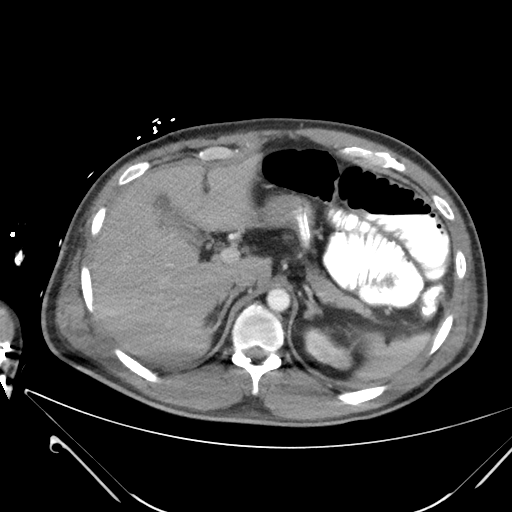
[im 98/110  soft-tissue]
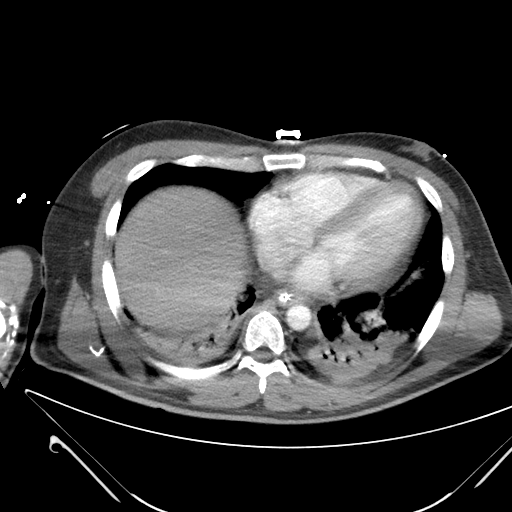

[Series 203: coronals, idose (2) · coronal · 0.45mm/px · 3 of 92 slices shown]
[im 31/92  soft-tissue]
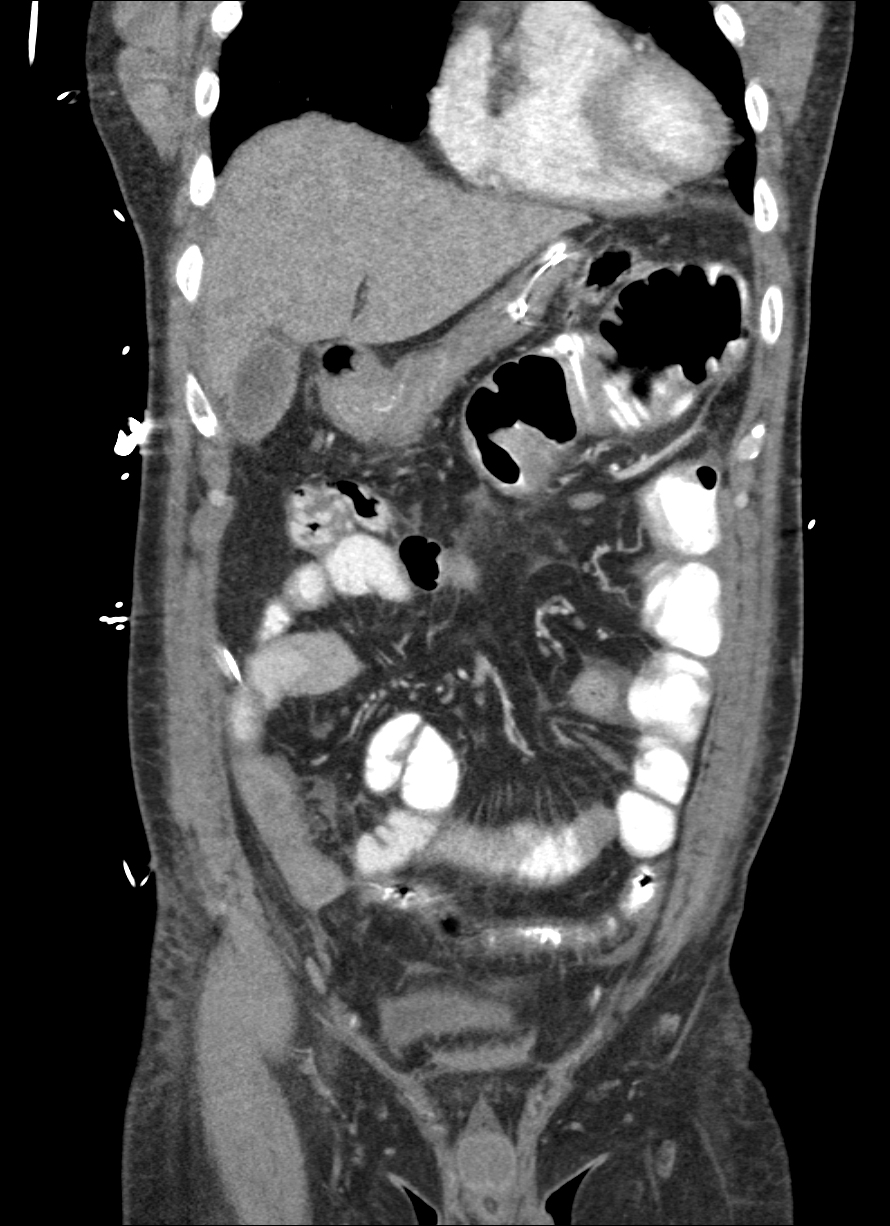
[im 41/92  soft-tissue]
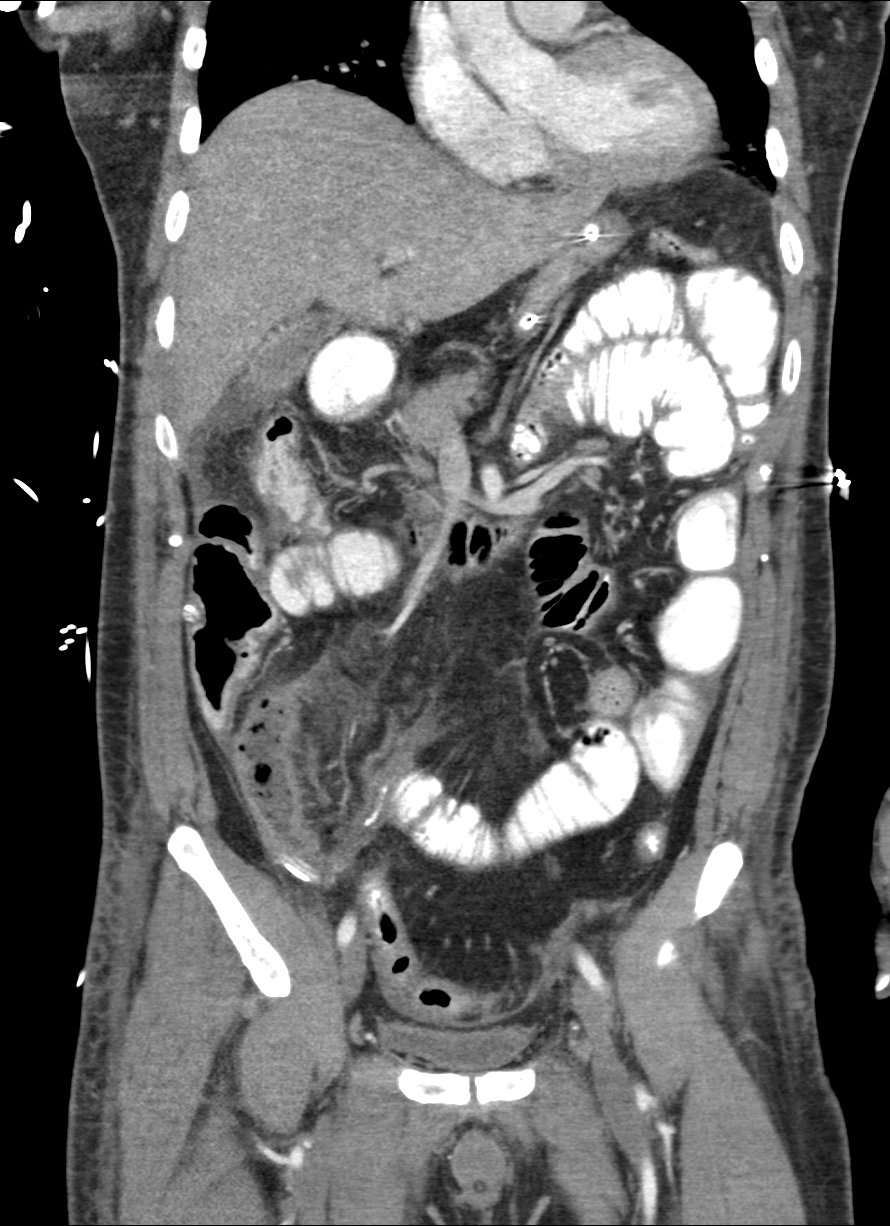
[im 51/92  soft-tissue]
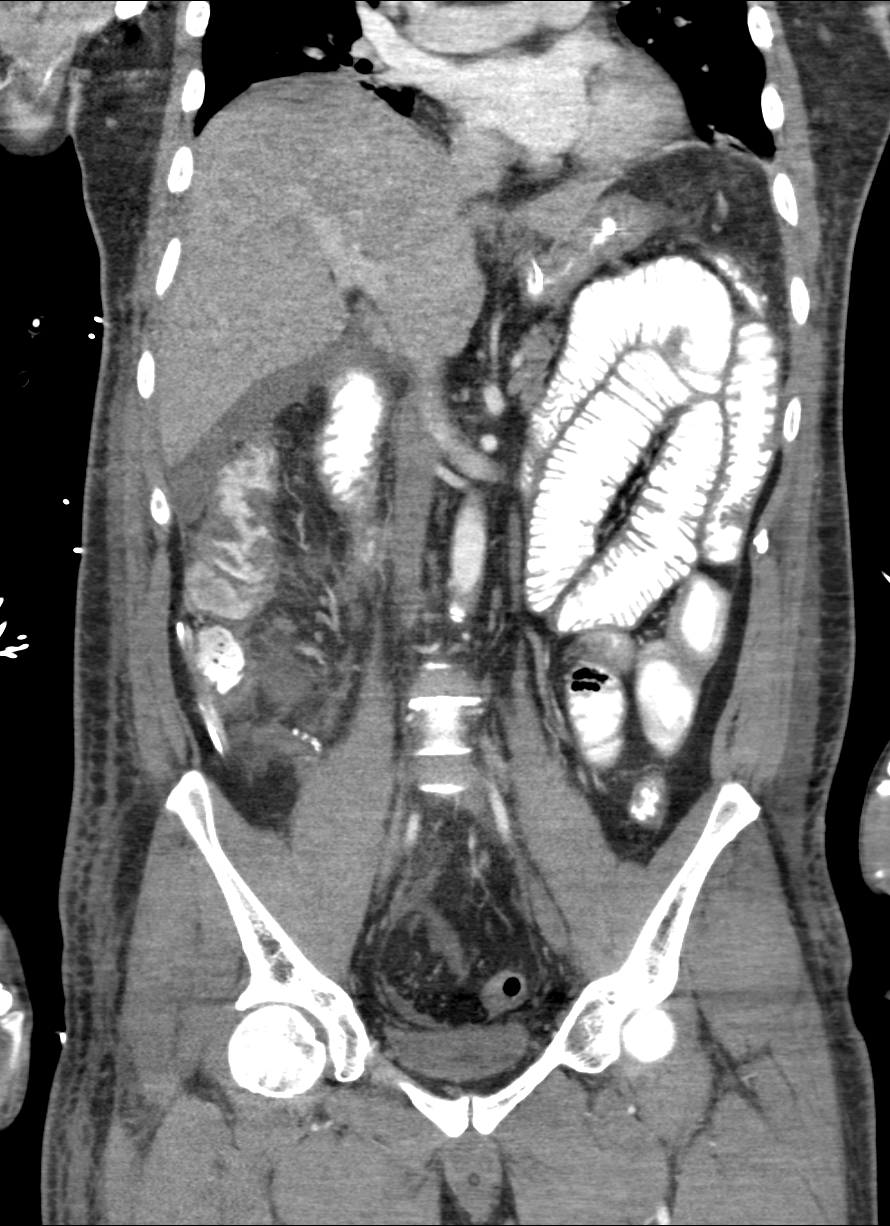

[9 of 46 positions shown; findings below may reference images not displayed]

FINDINGS: Contrast is present in the stomach and proximal and mid small bowel.
The small bowel loops are mildly distended with gas and the
contrast. There is a loop of unopacified distal small bowel on
images 61-82 position superior and medial to the drainage catheter.
There is a small amount of increased density within the adjacent
extraluminal soft tissues demonstrated on images 63 through 69. This
likely reflects a suture line though a small amount of extraluminal
contrast is not absolutely excluded. Small amounts of low density
interloop fluid persists. There is no discrete drainable abscess.

There is contrast in the colon likely remaining from the previous CT
scan. There is mild thickening of the colonic wall of the ascending
colon and rectosigmoid without adjacent abscess formation.

The liver, gallbladder, pancreas, spleen, adrenal glands, and
kidneys exhibit no acute abnormalities. There is a moderate amount
of fluid along the inferior surface of the liver with a smaller
amount in the paracolic gutter. The stomach contains a nasogastric
tube. There is a small amount of free fluid in the pelvis. The
urinary bladder is partially distended and contains a Foley
catheter. The lung bases exhibit parenchymal consolidation
posteriorly consistent with pneumonia.
IMPRESSION: 1. There is a small bowel ileus. There remains interloop fluid as
well as considerable fluid in the subhepatic region. A drainage
catheter is in place in the right lower abdomen and upper pelvis. A
small amount of radiodense material adjacent to the drainage
catheter on images 64-66 likely reflects suture, but a tiny amount
of extraluminal contrast is not excluded. No extraluminal contrast
collections are demonstrated elsewhere. There is no no new discrete
drainable abscess.
2. The small bowel gas pattern is consistent with an ileus. There is
a loop of thick-walled un-opacified distal small bowel in the right
lower quadrant.
3. The gallbladder is mildly distended without evidence of stones.
4. There is bibasilar pneumonia.

## 2022-01-30 ENCOUNTER — Encounter: Payer: Self-pay | Admitting: Podiatry

## 2022-01-30 ENCOUNTER — Ambulatory Visit: Payer: BC Managed Care – PPO | Admitting: Podiatry

## 2022-01-30 DIAGNOSIS — M7672 Peroneal tendinitis, left leg: Secondary | ICD-10-CM

## 2022-01-30 DIAGNOSIS — M7671 Peroneal tendinitis, right leg: Secondary | ICD-10-CM

## 2022-01-30 NOTE — Progress Notes (Signed)
Subjective:  Patient ID: Jesus Hunter, male    DOB: 05/07/1971,  MRN: [id number]  Chief Complaint  Patient presents with   Arthritis    Pt stated that he has arthritis and would like to be treated     51 y.o. male presents with the above complaint.  Patient presents with bilateral peroneal tendinitis left slightly worse than right side.  Patient states is painful to touch is progressive gotten worse hurts with ambulation hurts with pressure.  He states hurts on the outside of the foot it came out of nowhere.  He wanted to get it evaluated.  Pain scale 7 out of 10 especially with ambulation   Review of Systems: Negative except as noted in the HPI. Denies N/V/F/Ch.  Past Medical History:  Diagnosis Date   High cholesterol    Hypertension     Current Outpatient Medications:    acetaminophen (TYLENOL) 325 MG tablet, Take 325 mg by mouth every 4 (four) hours as needed (pain)., Disp: , Rfl:    caspofungin (CANCIDAS) 70 MG injection, Inject 70 mg into the vein once., Disp: 1 each, Rfl: 0   metoprolol (LOPRESSOR) 50 MG tablet, Take 1 tablet (50 mg total) by mouth 2 (two) times daily., Disp: 60 tablet, Rfl: 0   oxyCODONE-acetaminophen (PERCOCET/ROXICET) 5-325 MG per tablet, Take 1-2 tablets by mouth every 4 (four) hours as needed for moderate pain or severe pain., Disp: 30 tablet, Rfl: 0   polyethylene glycol (MIRALAX / GLYCOLAX) packet, Take 17 g by mouth daily., Disp: 14 each, Rfl: 0  Social History   Tobacco Use  Smoking Status Former   Packs/day: 1.00   Years: 12.00   Total pack years: 12.00   Types: Cigarettes  Smokeless Tobacco Never  Tobacco Comments   "quit smoking in 2003"    No Known Allergies Objective:  There were no vitals filed for this visit. There is no height or weight on file to calculate BMI. Constitutional Well developed. Well nourished.  Vascular Dorsalis pedis pulses palpable bilaterally. Posterior tibial pulses palpable bilaterally. Capillary refill normal  to all digits.  No cyanosis or clubbing noted. Pedal hair growth normal.  Neurologic Normal speech. Oriented to person, place, and time. Epicritic sensation to light touch grossly present bilaterally.  Dermatologic Nails well groomed and normal in appearance. No open wounds. No skin lesions.  Orthopedic: Pain on palpation along the course of the peroneal tendon including the insertion.  Left side slightly greater than right side.  Does not recall any injury.  No avulsion fractures noted.  No pain at the ATFL ligament, posterior tibial tendon, Achilles tendon pain with resisted dorsiflexion eversion of the foot   Radiographs: None Assessment:   1. Peroneal tendinitis of left lower extremity   2. Peroneal tendinitis of lower leg, right    Plan:  Patient was evaluated and treated and all questions answered.  Bilateral peroneal tendinitis left greater than right side -All questions and concerns were discussed with the patient in extensive detail given that the left side is grade 1 I will place him in a cam boot and immobilize the tendon and I will do a Tri-Lock ankle brace for the right side.  At this point we will switch the extremity once contralateral side has completely calm down.  I discussed with patient he states understanding and would like to proceed with the plan -Cam boot and Tri-Lock ankle brace were dispensed  No follow-ups on file.   Bilateral peroneal tendinitis left cam boot right  Tri-Lock ankle brace

## 2022-02-28 ENCOUNTER — Ambulatory Visit: Payer: BC Managed Care – PPO | Admitting: Podiatry

## 2022-02-28 DIAGNOSIS — M7671 Peroneal tendinitis, right leg: Secondary | ICD-10-CM

## 2022-02-28 DIAGNOSIS — M7672 Peroneal tendinitis, left leg: Secondary | ICD-10-CM

## 2022-02-28 MED ORDER — METHYLPREDNISOLONE 4 MG PO TBPK: ORAL_TABLET | ORAL | 0 refills | Status: AC

## 2022-02-28 MED ORDER — MELOXICAM 15 MG PO TABS: 15.0000 mg | ORAL_TABLET | Freq: Every day | ORAL | 0 refills | Status: DC

## 2022-02-28 NOTE — Progress Notes (Unsigned)
  Subjective:  Patient ID: Jesus Hunter, male    DOB: [birth year],  MRN: [id number]  Chief Complaint  Patient presents with   Foot Pain    51 y.o. male presents with the above complaint.  Patient presents for follow-up of bilateral tendon peroneal tendinitis he states is doing a lot better 100% resolved.  No pain noted.   Review of Systems: Negative except as noted in the HPI. Denies N/V/F/Ch.  Past Medical History:  Diagnosis Date   High cholesterol    Hypertension     Current Outpatient Medications:    meloxicam (MOBIC) 15 MG tablet, Take 1 tablet (15 mg total) by mouth daily., Disp: 30 tablet, Rfl: 0   methylPREDNISolone (MEDROL DOSEPAK) 4 MG TBPK tablet, Take as directed, Disp: 21 each, Rfl: 0   acetaminophen (TYLENOL) 325 MG tablet, Take 325 mg by mouth every 4 (four) hours as needed (pain)., Disp: , Rfl:    caspofungin (CANCIDAS) 70 MG injection, Inject 70 mg into the vein once., Disp: 1 each, Rfl: 0   metoprolol (LOPRESSOR) 50 MG tablet, Take 1 tablet (50 mg total) by mouth 2 (two) times daily., Disp: 60 tablet, Rfl: 0   oxyCODONE-acetaminophen (PERCOCET/ROXICET) 5-325 MG per tablet, Take 1-2 tablets by mouth every 4 (four) hours as needed for moderate pain or severe pain., Disp: 30 tablet, Rfl: 0   polyethylene glycol (MIRALAX / GLYCOLAX) packet, Take 17 g by mouth daily., Disp: 14 each, Rfl: 0  Social History   Tobacco Use  Smoking Status Former   Packs/day: 1.00   Years: 12.00   Total pack years: 12.00   Types: Cigarettes  Smokeless Tobacco Never  Tobacco Comments   "quit smoking in 2003"    No Known Allergies Objective:  There were no vitals filed for this visit. There is no height or weight on file to calculate BMI. Constitutional Well developed. Well nourished.  Vascular Dorsalis pedis pulses palpable bilaterally. Posterior tibial pulses palpable bilaterally. Capillary refill normal to all digits.  No cyanosis or clubbing noted. Pedal hair growth normal.   Neurologic Normal speech. Oriented to person, place, and time. Epicritic sensation to light touch grossly present bilaterally.  Dermatologic Nails well groomed and normal in appearance. No open wounds. No skin lesions.  Orthopedic: No further pain on palpation along the course of the peroneal tendon including the insertion.  Does not recall any injury.  No avulsion fractures noted.  No pain at the ATFL ligament, posterior tibial tendon, Achilles tendon pain with resisted dorsiflexion eversion of the foot   Radiographs: None Assessment:   No diagnosis found.  Plan:  Patient was evaluated and treated and all questions answered.  Bilateral peroneal tendinitis left greater than right side -Clinically healed and officially discharged from my care.  Cam boot immobilization and Tri-Lock ankle brace has helped considerably.  At this point if the pain comes back he will come back and see me.  No follow-ups on file.

## 2022-03-28 ENCOUNTER — Other Ambulatory Visit: Payer: Self-pay | Admitting: Podiatry

## 2022-05-01 ENCOUNTER — Other Ambulatory Visit: Payer: Self-pay | Admitting: Podiatry

## 2022-05-30 ENCOUNTER — Other Ambulatory Visit: Payer: Self-pay | Admitting: Podiatry

## 2022-07-04 ENCOUNTER — Other Ambulatory Visit: Payer: Self-pay | Admitting: Podiatry

## 2022-08-29 ENCOUNTER — Other Ambulatory Visit: Payer: Self-pay | Admitting: Podiatry
